# Patient Record
Sex: Female | Born: 1940
Health system: Southern US, Community
[De-identification: ages and names within clinical notes are randomized; demographics above are authoritative.]

## PROBLEM LIST (undated history)

## (undated) DIAGNOSIS — L409 Psoriasis, unspecified: Secondary | ICD-10-CM

## (undated) DIAGNOSIS — G4700 Insomnia, unspecified: Secondary | ICD-10-CM

## (undated) DIAGNOSIS — K635 Polyp of colon: Secondary | ICD-10-CM

## (undated) DIAGNOSIS — E7800 Pure hypercholesterolemia, unspecified: Secondary | ICD-10-CM

## (undated) DIAGNOSIS — T7840XA Allergy, unspecified, initial encounter: Secondary | ICD-10-CM

## (undated) DIAGNOSIS — D72819 Decreased white blood cell count, unspecified: Secondary | ICD-10-CM

## (undated) DIAGNOSIS — M8580 Other specified disorders of bone density and structure, unspecified site: Secondary | ICD-10-CM

## (undated) DIAGNOSIS — M1990 Unspecified osteoarthritis, unspecified site: Secondary | ICD-10-CM

## (undated) DIAGNOSIS — E042 Nontoxic multinodular goiter: Secondary | ICD-10-CM

## (undated) DIAGNOSIS — I10 Essential (primary) hypertension: Secondary | ICD-10-CM

## (undated) DIAGNOSIS — B029 Zoster without complications: Secondary | ICD-10-CM

## (undated) HISTORY — DX: Pure hypercholesterolemia, unspecified: E78.00

## (undated) HISTORY — DX: Decreased white blood cell count, unspecified: D72.819

## (undated) HISTORY — DX: Zoster without complications: B02.9

## (undated) HISTORY — DX: Essential (primary) hypertension: I10

## (undated) HISTORY — DX: Insomnia, unspecified: G47.00

## (undated) HISTORY — DX: Polyp of colon: K63.5

## (undated) HISTORY — DX: Other specified disorders of bone density and structure, unspecified site: M85.80

## (undated) HISTORY — PX: TONSILLECTOMY AND ADENOIDECTOMY: SHX28

## (undated) HISTORY — DX: Nontoxic multinodular goiter: E04.2

## (undated) HISTORY — DX: Unspecified osteoarthritis, unspecified site: M19.90

## (undated) HISTORY — DX: Allergy, unspecified, initial encounter: T78.40XA

## (undated) HISTORY — DX: Psoriasis, unspecified: L40.9

## (undated) HISTORY — PX: CATARACT EXTRACTION, BILATERAL: SHX1313

---

## 1967-08-10 HISTORY — PX: KNEE SURGERY: SHX244

## 1977-08-09 HISTORY — PX: HAND TENDON SURGERY: SHX663

## 2000-02-23 ENCOUNTER — Other Ambulatory Visit: Admission: RE | Admit: 2000-02-23 | Discharge: 2000-02-23 | Payer: Self-pay | Admitting: *Deleted

## 2000-05-09 HISTORY — PX: FLEXIBLE SIGMOIDOSCOPY: SHX1649

## 2000-05-24 ENCOUNTER — Ambulatory Visit (HOSPITAL_COMMUNITY): Admission: RE | Admit: 2000-05-24 | Discharge: 2000-05-24 | Payer: Self-pay | Admitting: Gastroenterology

## 2002-01-02 ENCOUNTER — Other Ambulatory Visit: Admission: RE | Admit: 2002-01-02 | Discharge: 2002-01-02 | Payer: Self-pay | Admitting: *Deleted

## 2002-02-06 HISTORY — PX: OTHER SURGICAL HISTORY: SHX169

## 2006-12-08 HISTORY — PX: COLONOSCOPY: SHX174

## 2009-04-21 ENCOUNTER — Encounter: Admission: RE | Admit: 2009-04-21 | Discharge: 2009-04-21 | Payer: Self-pay | Admitting: Family Medicine

## 2009-12-22 ENCOUNTER — Encounter: Admission: RE | Admit: 2009-12-22 | Discharge: 2009-12-22 | Payer: Self-pay | Admitting: Family Medicine

## 2010-01-20 ENCOUNTER — Encounter: Admission: RE | Admit: 2010-01-20 | Discharge: 2010-01-20 | Payer: Self-pay | Admitting: Endocrinology

## 2010-01-20 ENCOUNTER — Other Ambulatory Visit: Admission: RE | Admit: 2010-01-20 | Discharge: 2010-01-20 | Payer: Self-pay | Admitting: Diagnostic Radiology

## 2010-12-25 NOTE — Procedures (Signed)
[place]. [facility]  Patient:    Lisa [name], Lisa [name]                      MRN: [id number] Proc. Date: 05/24/00 Adm. Date:  [id number] Attending:  Charna Elizabeth CC:         Heather Roberts, M.D.   Procedure Report  DATE OF BIRTH:  1940-09-29.  REFERRING PHYSICIAN:  Heather Roberts, M.D.  PROCEDURE PERFORMED:  Flexible sigmoidoscopy.  ENDOSCOPIST:  Anselmo Rod, M.D.  INSTRUMENT USED:  Olympus video colonoscope.  INDICATIONS FOR PROCEDURE:  Screening flexible sigmoidoscopy being performed in a 70 year old white female.  Rule out colonic polyps, masses, hemorrhoids, etc.  PREPROCEDURE PREPARATION:  Informed consent was procured from the patient. The patient was fasted for eight hours prior to the procedure and prepped with two Fleets enemas the morning of the procedure.  PREPROCEDURE PHYSICAL:  The patient had stable vital signs.  Neck supple. Chest clear to auscultation.  S1, S2 regular.  No murmur, rub or gallop.  No rales, rhonchi or wheezing.  Abdomen soft with normal abdominal bowel sounds.  DESCRIPTION OF PROCEDURE:  The patient was placed in the left lateral decubitus position.  No sedation was used.  Once the patient was adequately positioned, the Olympus video colonoscope was advanced from the rectum to 70 cm without difficulty.  The patient had a few left-sided diverticula that were very small.  The patient had no masses or polyps seen.  The patient had prominent nonbleeding internal hemorrhoids and tolerated the procedure well without complication.  IMPRESSION: 1. Essentially healthy-appearing colon up to 70 cm except for a few left-sided    diverticula. 2. Prominent nonbleeding internal hemorrhoids.  RECOMMENDATIONS: 1. Increase fluid and fiber in the diet. 2. Outpatient follow-up p.r.n. 3. Five year colorectal cancer screen.  RECOMMENDATIONS:DD:  05/24/00 TD:  05/24/00 Job: [id number]

## 2011-01-01 ENCOUNTER — Encounter: Payer: Self-pay | Admitting: Family Medicine

## 2011-01-06 ENCOUNTER — Other Ambulatory Visit: Payer: Self-pay | Admitting: Family Medicine

## 2011-01-06 ENCOUNTER — Ambulatory Visit (INDEPENDENT_AMBULATORY_CARE_PROVIDER_SITE_OTHER): Payer: BC Managed Care – PPO | Admitting: Family Medicine

## 2011-01-06 ENCOUNTER — Encounter: Payer: Self-pay | Admitting: Family Medicine

## 2011-01-06 ENCOUNTER — Other Ambulatory Visit (HOSPITAL_COMMUNITY)
Admission: RE | Admit: 2011-01-06 | Discharge: 2011-01-06 | Disposition: A | Discharge status: Home / Self Care | Payer: Medicare Other | Source: Ambulatory Visit | Attending: Family Medicine | Admitting: Family Medicine

## 2011-01-06 VITALS — BP 124/84 | HR 72 | Ht 62.0 in | Wt 145.0 lb

## 2011-01-06 DIAGNOSIS — E7800 Pure hypercholesterolemia, unspecified: Secondary | ICD-10-CM

## 2011-01-06 DIAGNOSIS — Z124 Encounter for screening for malignant neoplasm of cervix: Secondary | ICD-10-CM | POA: Insufficient documentation

## 2011-01-06 DIAGNOSIS — M8580 Other specified disorders of bone density and structure, unspecified site: Secondary | ICD-10-CM | POA: Insufficient documentation

## 2011-01-06 DIAGNOSIS — E042 Nontoxic multinodular goiter: Secondary | ICD-10-CM

## 2011-01-06 DIAGNOSIS — Z0000 Encounter for general adult medical examination without abnormal findings: Secondary | ICD-10-CM

## 2011-01-06 DIAGNOSIS — M949 Disorder of cartilage, unspecified: Secondary | ICD-10-CM

## 2011-01-06 DIAGNOSIS — I10 Essential (primary) hypertension: Secondary | ICD-10-CM | POA: Insufficient documentation

## 2011-01-06 DIAGNOSIS — G4700 Insomnia, unspecified: Secondary | ICD-10-CM

## 2011-01-06 DIAGNOSIS — M899 Disorder of bone, unspecified: Secondary | ICD-10-CM

## 2011-01-06 DIAGNOSIS — E039 Hypothyroidism, unspecified: Secondary | ICD-10-CM | POA: Insufficient documentation

## 2011-01-06 LAB — LIPID PANEL
Cholesterol: 183 mg/dL (ref 0–200)
HDL: 64 mg/dL (ref >39)
Total CHOL/HDL Ratio: 2.9 Ratio

## 2011-01-06 LAB — POCT URINALYSIS DIPSTICK
Blood, UA: NEGATIVE
Ketones, UA: NEGATIVE
Leukocytes, UA: NEGATIVE
pH, UA: 8

## 2011-01-06 LAB — TSH: TSH: 0.328 u[IU]/mL — ABNORMAL LOW (ref 0.350–4.500)

## 2011-01-06 LAB — COMPREHENSIVE METABOLIC PANEL
ALT: 20 U/L (ref 0–35)
AST: 20 U/L (ref 0–37)
BUN: 11 mg/dL (ref 6–23)
CO2: 25 mEq/L (ref 19–32)
Chloride: 104 mEq/L (ref 96–112)
Glucose, Bld: 87 mg/dL (ref 70–99)
Potassium: 4.3 mEq/L (ref 3.5–5.3)
Total Protein: 7 g/dL (ref 6.0–8.3)

## 2011-01-06 MED ORDER — IRBESARTAN 75 MG PO TABS: 75.0000 mg | ORAL_TABLET | Freq: Every day | ORAL | Status: DC

## 2011-01-06 MED ORDER — LEVOTHYROXINE SODIUM 50 MCG PO TABS: 50.0000 ug | ORAL_TABLET | Freq: Every day | ORAL | Status: DC

## 2011-01-06 MED ORDER — ATORVASTATIN CALCIUM 20 MG PO TABS: 20.0000 mg | ORAL_TABLET | Freq: Every day | ORAL | Status: DC

## 2011-01-06 MED ORDER — QUETIAPINE FUMARATE 25 MG PO TABS: 25.0000 mg | ORAL_TABLET | Freq: Every day | ORAL | Status: DC

## 2011-01-06 NOTE — Progress Notes (Signed)
Subjective:    Patient ID: Lisa [name], female    DOB: [birth year], 70 y.o.   MRN: [id number]  HPI LUCIANNE SMESTAD is a 70 y.o. female who presents for a complete physical.  She is a new patient to the practice, and is also is here for medication check, needing refills of all of her medications.  Hypertension follow-up:  Blood pressures aren't checked elsewhere.  Denies dizziness, headaches, chest pain.  Denies side effects of medications.  Hyperlipidemia follow-up:  Patient is reportedly following a low-fat, low cholesterol diet.  Compliant with medications and denies medication side effects  Hypothyroidism:  Last blood test was 6 months ago with Dr. Talmage Nap.  Has no f/u arranged with her.  Thyroid biopsy in 2011 was benign  Insomnia:  Well controlled with Seroquel; has been on it for 2 years  Osteopenia: Takes Calcium with D BID, gets regular exercise.  Gets some sun daily (eats lunch outside). Last DEXA 2 years ago  Immunization History  Administered Date(s) Administered  . DTaP 01/02/2002  . Influenza Whole 05/02/2010  . Pneumococcal Polysaccharide 09/21/2005   Last Pap smear: years ago Last mammogram: a few years ago Last colonoscopy: 5/08, due again in 2018 Last DEXA: 10/09 Yolanda Bonine) Dentist--sees every 6 months ophtho--every year Exercise--[employer] 3x/week, Zumba 2x/week, and walks on the weekends  Past Medical History  Diagnosis Date  . Allergy   . Leukopenia   . Osteopenia   . OA (osteoarthritis)     hips, knees and LS  . Psoriasis   . Hypertension   . Hypercholesteremia   . Insomnia   . Colon polyps   . Vitamin D deficiency   . Multinodular thyroid     Past Surgical History  Procedure Date  . Flexible sigmoidoscopy 05-2000  . Cardiolyte 02-2002  . Colonoscopy 12-2006  . Tonsillectomy and adenoidectomy   . Knee surgery 1969    Left, cartilage  . Hand tendon surgery 1979    L 4th finger    History   Social History  . Marital Status: Married   Spouse Name: N/A    Number of Children: 2  . 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 Smoking status: Never Smoker   . Smokeless tobacco: Not on file  . Alcohol Use: No     socially, 2 drinks twice weekly  . Drug Use: No  . Sexually Active: Not on file   Other Topics Concern  . Not on file   Social History Narrative  . No narrative on file    Family History  Problem Relation Age of Onset  . Allergies Mother   . Hypertension Mother   . Vision loss Mother   . Heart disease Mother   . Arthritis Mother   . Arthritis Father   . Heart disease Father 61  . Hearing loss Sister   . Hypertension Sister   . Hyperlipidemia Sister   . Thyroid disease Sister   . Arthritis Sister   . Stroke Maternal Grandmother   . Heart disease Maternal Grandfather   . Stroke Maternal Grandfather   . Heart disease Paternal Grandmother   . Stroke Paternal Grandmother   . Cancer Neg Hx   . Arthritis Sister   . Heart disease Sister   . Hyperlipidemia Sister   . Hypertension Sister   . Vision loss Sister     macular degeneration  . Arthritis  Sister   . Vision loss Sister     macular degeneration  . Hypertension Sister   . Hyperlipidemia Sister   . Thyroid disease Sister     Current outpatient prescriptions:aspirin 81 MG tablet, Take 81 mg by mouth daily.  , Disp: , Rfl: ;  atorvastatin (LIPITOR) 20 MG tablet, Take 1 tablet (20 mg total) by mouth daily., Disp: 90 tablet, Rfl: 3;  Calcium Carbonate-Vitamin D (CALTRATE 600+D PO), Take 1 tablet by mouth 2 (two) times daily.  , Disp: , Rfl:  fluocinonide (LIDEX) 0.05 % gel, Apply 1 application topically 4 (four) times daily. Apply to affected area four times daily until resolved , Disp: , Rfl: ;  irbesartan (AVAPRO) 75 MG tablet, Take 1 tablet (75 mg total) by mouth at bedtime., Disp: 90 tablet, Rfl: 3;  levothyroxine (SYNTHROID, LEVOTHROID) 50 MCG tablet, Take 1 tablet  (50 mcg total) by mouth daily., Disp: 90 tablet, Rfl: 3 Multiple Vitamins-Minerals (CENTRUM SILVER PO), Take 1 tablet by mouth daily.  , Disp: , Rfl: ;  QUEtiapine (SEROQUEL) 25 MG tablet, Take 1 tablet (25 mg total) by mouth at bedtime., Disp: 90 tablet, Rfl: 3;  DISCONTD: atorvastatin (LIPITOR) 20 MG tablet, Take 20 mg by mouth daily.  , Disp: , Rfl: ;  DISCONTD: irbesartan (AVAPRO) 75 MG tablet, Take 75 mg by mouth at bedtime.  , Disp: , Rfl:  DISCONTD: levothyroxine (SYNTHROID, LEVOTHROID) 50 MCG tablet, Take 50 mcg by mouth daily.  , Disp: , Rfl: ;  DISCONTD: QUEtiapine (SEROQUEL) 25 MG tablet, Take 25 mg by mouth at bedtime.  , Disp: , Rfl:   Allergies  Allergen Reactions  . Amoxil Rash  . Erythromycin Rash  . Sulfa Antibiotics Rash   Review of Systems The patient denies anorexia, fever, weight changes, fatigue, headaches,  vision changes,ear pain, sore throat, breast concerns, chest pain, palpitations, dizziness, syncope, dyspnea on exertion, cough, swelling, nausea, vomiting, diarrhea, constipation, abdominal pain, melena, hematochezia, indigestion/heartburn, hematuria, incontinence, dysuria, postmenopausal bleeding, vaginal discharge, odor or itch, genital lesions,  numbness, tingling, weakness, tremor, suspicious skin lesions, depression, anxiety, abnormal bleeding/bruising, or enlarged lymph nodes.  Canker sores--treated with Lidex from the dentist.  Currently has some soreness to her lateral tongue and roof of mouth.  Occasional L hip and knee pain.  Occasional ear plugging (R) and tinnitus    Objective:   Physical Exam BP 124/84  Pulse 72  Ht 5\' 2"  (1.575 m)  Wt 145 lb (65.772 kg)  BMI 26.52 kg/m2  General Appearance:    Alert, cooperative, no distress, appears stated age  Head:    Normocephalic, without obvious abnormality, atraumatic  Eyes:    PERRL, conjunctiva/corneas clear, EOM's intact, fundi    benign  Ears:    Normal TM's and external ear canals, some cerumen partly  obscuring R TM  Nose:   Nares normal, mucosa normal, no drainage or sinus   tenderness  Throat:   Lips, mucosa, and tongue normal; teeth and gums normal  Neck:   Supple, no lymphadenopathy;  thyroid: borderline size, no discrete nodules; no carotid bruit or JVD  Back:    Spine nontender, no curvature, ROM normal, no CVA     tenderness  Lungs:     Clear to auscultation bilaterally without wheezes, rales or     ronchi; respirations unlabored  Chest Wall:    No tenderness or deformity   Heart:    Regular rate and rhythm, S1 and S2 normal, no murmur, rub  or gallop  Breast Exam:    No tenderness, masses, or nipple discharge or inversion.      No axillary lymphadenopathy  Abdomen:     Soft, non-tender, nondistended, normoactive bowel sounds,    no masses, no hepatosplenomegaly  Genitalia:    Normal external genitalia without lesions.  Mild atrophic changes. BUS and vagina normal; cervix --polyp noted at os (approx 3 mm), no cervical motion tenderness. No abnormal vaginal discharge.  Uterus and adnexa not enlarged, nontender, no masses.  Pap performed  Rectal:    Normal tone, no masses or tenderness; guaiac negative stool  Extremities:   No clubbing, cyanosis or edema  Pulses:   2+ and symmetric all extremities  Skin:   Skin color, texture, turgor normal, no rashes or lesions  Lymph nodes:   Cervical, supraclavicular, and axillary nodes normal  Neurologic:   CNII-XII intact, normal strength, sensation and gait; reflexes 2+ and symmetric throughout          Psych:   Normal mood, affect, hygiene and grooming.        Assessment & Plan:   1. Routine general medical examination at a health care facility  POCT urinalysis dipstick, Visual acuity screening, Cytology - PAP  2. Pure hypercholesterolemia  Lipid panel, atorvastatin (LIPITOR) 20 MG tablet  3. Essential hypertension, benign  Comprehensive metabolic panel, irbesartan (AVAPRO) 75 MG tablet  4. Multinodular thyroid    5. Osteopenia  DG Bone  Density  6. Insomnia  QUEtiapine (SEROQUEL) 25 MG tablet  7. Unspecified hypothyroidism  TSH, levothyroxine (SYNTHROID, LEVOTHROID) 50 MCG tablet   Cervical polyp.  Pap obtained.  Declined referral to GYN for removal.  Most likely is benign--re-check at CPE next year.   Discussed monthly self breast exams and yearly mammograms after the age of 83; at least 30 minutes of aerobic activity at least 5 days/week; proper sunscreen use reviewed; healthy diet, including goals of calcium and vitamin D intake and alcohol recommendations (less than or equal to 1 drink/day) reviewed; regular seatbelt use; changing batteries in smoke detectors.  Immunization recommendations discussed--discussed Zostavax, and Rx given (risks/benefits reviewed).  TDaP next year. Colonoscopy recommendations reviewed, due again in 2018.  Hemoccult kit given

## 2011-01-07 NOTE — Progress Notes (Signed)
Left message for pt to return my call to go over lab results. vs

## 2011-01-08 ENCOUNTER — Encounter: Payer: Self-pay | Admitting: Family Medicine

## 2011-01-08 NOTE — Progress Notes (Signed)
Pt called and said ok to leave results on her voice mail..  When I found results I called pt home [phone] and left info on labs from Dr. Lynelle Doctor

## 2011-01-18 ENCOUNTER — Telehealth: Payer: Self-pay | Admitting: *Deleted

## 2011-01-18 NOTE — Telephone Encounter (Signed)
Patient called Friday morning to ask questions re:2 of her meds, synthroid and seroquel. Patient stated that she has been taking generic synthroid and the brand name was called in and that she had been taking brand name seroquel and the generic was filled. She was wanting to know if this is okay or should she go back to the way she has been taking them for years since she had no problems in the past. Thx.

## 2011-01-18 NOTE — Telephone Encounter (Signed)
Pt informed that using the generic of Seroquel and brand of Synthroid is ok and that Dr.Knapp actually prefers this. Pt stated that she is fine with that and for her there was no price difference. She will call if needing a change in the future after taking the meds and finding that she is not happy.

## 2011-01-18 NOTE — Telephone Encounter (Signed)
Advise pt--generic Seroquel should be fine. If she has a problem with it, let us know and we can change it back to brand (only if she thinks she is noticing a difference and prefers the other).  In general, I prefer to use branded thyroid meds if patients are able to afford the difference.  I'm sure we asked her regarding brand vs generic at her visit.  If she prefers [place] to switch it back to generic (mainly for cost purposes), then we can.

## 2011-02-15 ENCOUNTER — Telehealth: Payer: Self-pay | Admitting: *Deleted

## 2011-02-15 NOTE — Telephone Encounter (Signed)
Left message for patient to return my call for bone density results.

## 2011-02-17 ENCOUNTER — Telehealth: Payer: Self-pay | Admitting: *Deleted

## 2011-02-17 NOTE — Telephone Encounter (Signed)
Spoke with patient, went over bone density results with her. Informed her that her test was unchanged from her test in 2009, mild osteopenia. Continue with calcium and vitamin d and exercise. Recheck 2-3 years.

## 2011-03-08 ENCOUNTER — Encounter: Payer: Self-pay | Admitting: *Deleted

## 2011-08-20 ENCOUNTER — Other Ambulatory Visit: Payer: BC Managed Care – PPO

## 2011-08-20 DIAGNOSIS — R6889 Other general symptoms and signs: Secondary | ICD-10-CM | POA: Diagnosis not present

## 2011-08-20 DIAGNOSIS — R7989 Other specified abnormal findings of blood chemistry: Secondary | ICD-10-CM

## 2011-08-20 DIAGNOSIS — I10 Essential (primary) hypertension: Secondary | ICD-10-CM | POA: Diagnosis not present

## 2011-08-20 DIAGNOSIS — E039 Hypothyroidism, unspecified: Secondary | ICD-10-CM | POA: Diagnosis not present

## 2011-08-23 ENCOUNTER — Encounter: Payer: Self-pay | Admitting: Family Medicine

## 2011-09-10 DIAGNOSIS — H2510 Age-related nuclear cataract, unspecified eye: Secondary | ICD-10-CM | POA: Diagnosis not present

## 2012-01-13 ENCOUNTER — Ambulatory Visit (INDEPENDENT_AMBULATORY_CARE_PROVIDER_SITE_OTHER): Payer: BC Managed Care – PPO | Admitting: Family Medicine

## 2012-01-13 ENCOUNTER — Encounter: Payer: Self-pay | Admitting: Family Medicine

## 2012-01-13 VITALS — BP 122/78 | HR 76 | Ht 61.5 in | Wt 143.0 lb

## 2012-01-13 DIAGNOSIS — M8580 Other specified disorders of bone density and structure, unspecified site: Secondary | ICD-10-CM

## 2012-01-13 DIAGNOSIS — E039 Hypothyroidism, unspecified: Secondary | ICD-10-CM | POA: Diagnosis not present

## 2012-01-13 DIAGNOSIS — Z23 Encounter for immunization: Secondary | ICD-10-CM

## 2012-01-13 DIAGNOSIS — Z01419 Encounter for gynecological examination (general) (routine) without abnormal findings: Secondary | ICD-10-CM | POA: Diagnosis not present

## 2012-01-13 DIAGNOSIS — E7800 Pure hypercholesterolemia, unspecified: Secondary | ICD-10-CM | POA: Diagnosis not present

## 2012-01-13 DIAGNOSIS — M899 Disorder of bone, unspecified: Secondary | ICD-10-CM

## 2012-01-13 DIAGNOSIS — M949 Disorder of cartilage, unspecified: Secondary | ICD-10-CM

## 2012-01-13 DIAGNOSIS — G4700 Insomnia, unspecified: Secondary | ICD-10-CM

## 2012-01-13 DIAGNOSIS — I10 Essential (primary) hypertension: Secondary | ICD-10-CM

## 2012-01-13 LAB — POCT URINALYSIS DIPSTICK
Bilirubin, UA: NEGATIVE
Glucose, UA: NEGATIVE
Ketones, UA: NEGATIVE
Leukocytes, UA: NEGATIVE
Urobilinogen, UA: NEGATIVE
pH, UA: 7

## 2012-01-13 MED ORDER — QUETIAPINE FUMARATE 25 MG PO TABS: 25.0000 mg | ORAL_TABLET | Freq: Every day | ORAL | Status: DC

## 2012-01-13 MED ORDER — SYNTHROID 50 MCG PO TABS: 50.0000 ug | ORAL_TABLET | Freq: Every day | ORAL | Status: DC

## 2012-01-13 MED ORDER — ATORVASTATIN CALCIUM 20 MG PO TABS: 20.0000 mg | ORAL_TABLET | Freq: Every day | ORAL | Status: DC

## 2012-01-13 MED ORDER — IRBESARTAN 75 MG PO TABS: 75.0000 mg | ORAL_TABLET | Freq: Every day | ORAL | Status: DC

## 2012-01-13 NOTE — Patient Instructions (Signed)

## 2012-01-13 NOTE — Progress Notes (Signed)
Patient presents for med check, as well as breast and pelvic exam (as her insurance doesn't cover physicals).  Hypertension follow-up: Blood pressures aren't checked elsewhere. Denies dizziness, headaches, chest pain. Denies side effects of medications.   Hyperlipidemia follow-up: Patient is reportedly following a low-fat, low cholesterol diet. Compliant with medications and denies medication side effects   Hypothyroidism: Thyroid biopsy in 2011 was benign; had seen Dr. Talmage Nap in the past, but we are now managing her medications.  Denies fatigue, hair, skin, bowel changes.  Insomnia: Well controlled with Seroquel; has been on it for 3 years   Osteopenia: Takes Calcium with D BID, gets regular exercise. Gets some sun daily (eats lunch outside). Last DEXA 01/2011, T-1.4.  Unchanged from 2009, recommended to repeat in 2-3 years.  Health Maintenance: Immunization History  Administered Date(s) Administered  . DTaP 01/02/2002  . Influenza Whole 05/02/2010  . Pneumococcal Polysaccharide 09/21/2005  . Zoster 02/26/2011  gets flu shots yearly through work Last mammo 02/2012 Colonoscopy 5/08 Dexa 01/2011 Dentist and ophtho regularly Exercise: stopped going to [employer] due to knee pain.  Walks 3 days/week, 30 minutes. Pap: 12/2010  Past Medical History  Diagnosis Date  . Allergy   . Leukopenia   . Osteopenia   . OA (osteoarthritis)     hips, knees and LS  . Psoriasis   . Hypertension   . Hypercholesteremia   . Insomnia   . Colon polyps     hyperplastic  . Vitamin d deficiency   . Multinodular thyroid     Past Surgical History  Procedure Date  . Flexible sigmoidoscopy 05-2000  . Cardiolyte 02-2002  . Colonoscopy 12-2006  . Tonsillectomy and adenoidectomy   . Knee surgery 1969    Left, cartilage  . Hand tendon surgery 1979    L 4th finger    History   Social History  . Marital Status: Married    Spouse Name: N/A    Number of Children: 2  . 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 Smoking status: Never Smoker   . Smokeless tobacco: Never Used  . Alcohol Use: Yes     socially, 2 drinks twice weekly  . Drug Use: No  . Sexually Active: Yes -- Female partner(s)   Other Topics Concern  . Not on file   Social History Narrative  . No narrative on file    Family History  Problem Relation Age of Onset  . Allergies Mother   . Hypertension Mother   . Vision loss Mother   . Heart disease Mother   . Arthritis Mother   . Arthritis Father   . Heart disease Father 6  . Hearing loss Sister   . Hypertension Sister   . Hyperlipidemia Sister   . Arthritis Sister   . Stroke Maternal Grandmother   . Heart disease Maternal Grandfather   . Stroke Maternal Grandfather   . Heart disease Paternal Grandmother   . Stroke Paternal Grandmother   . Cancer Neg Hx   . Arthritis Sister   . Heart disease Sister   . Hyperlipidemia Sister   . Hypertension Sister   . Vision loss Sister     macular degeneration  . Arthritis Sister   . Hypertension Sister   . Hyperlipidemia Sister   . Thyroid disease Sister    Current Outpatient Prescriptions on File Prior to Visit  Medication Sig Dispense Refill  .  aspirin 81 MG tablet Take 81 mg by mouth daily.        Marland Kitchen atorvastatin (LIPITOR) 20 MG tablet Take 1 tablet (20 mg total) by mouth daily.  90 tablet  3  . Calcium Carbonate-Vitamin D (CALTRATE 600+D PO) Take 1 tablet by mouth 2 (two) times daily.        . fluocinonide (LIDEX) 0.05 % gel Apply 1 application topically 4 (four) times daily. Apply to affected area four times daily until resolved       . irbesartan (AVAPRO) 75 MG tablet Take 1 tablet (75 mg total) by mouth at bedtime.  90 tablet  3  . levothyroxine (SYNTHROID, LEVOTHROID) 50 MCG tablet Take 1 tablet (50 mcg total) by mouth daily.  90 tablet  3  . Multiple Vitamins-Minerals (CENTRUM SILVER PO) Take 1 tablet by mouth daily.         . QUEtiapine (SEROQUEL) 25 MG tablet Take 1 tablet (25 mg total) by mouth at bedtime.  90 tablet  3    Allergies  Allergen Reactions  . Amoxicillin Rash  . Erythromycin Rash  . Sulfa Antibiotics Rash   ROS:  The patient denies anorexia, fever, weight changes, headaches,  vision changes, decreased hearing, ear pain, sore throat, breast concerns, chest pain, palpitations, dizziness, syncope, dyspnea on exertion, cough, swelling, nausea, vomiting, diarrhea, constipation, abdominal pain, melena, hematochezia, indigestion/heartburn, hematuria, incontinence, dysuria, vaginal bleeding, discharge, odor or itch, genital lesions, joint pains, numbness, tingling, weakness, tremor, suspicious skin lesions, depression, anxiety, abnormal bleeding/bruising, or enlarged lymph nodes.  PHYSICAL EXAM: BP 122/78  Pulse 76  Ht 5' 1.5" (1.562 m)  Wt 143 lb (64.864 kg)  BMI 26.58 kg/m2 BP 122/78  Pulse 76  Ht 5' 1.5" (1.562 m)  Wt 143 lb (64.864 kg)  BMI 26.58 kg/m2  General Appearance:    Alert, cooperative, no distress, appears stated age  Head:    Normocephalic, without obvious abnormality, atraumatic  Eyes:    PERRL, conjunctiva/corneas clear, EOM's intact, fundi    benign  Ears:    Normal TM's and external ear canals  Nose:   Nares normal, mucosa normal, no drainage or sinus   tenderness  Throat:   Lips, mucosa, and tongue normal; teeth and gums normal  Neck:   Supple, no lymphadenopathy;  thyroid:  no   enlargement/tenderness/nodules; no carotid   bruit or JVD  Back:    Spine nontender, no curvature, ROM normal, no CVA     tenderness  Lungs:     Clear to auscultation bilaterally without wheezes, rales or     ronchi; respirations unlabored  Chest Wall:    No tenderness or deformity   Heart:    Regular rate and rhythm, S1 and S2 normal, no murmur, rub   or gallop  Breast Exam:    No tenderness, masses, or nipple discharge or inversion.      No axillary lymphadenopathy  Abdomen:     Soft,  non-tender, nondistended, normoactive bowel sounds,    no masses, no hepatosplenomegaly  Genitalia:    Normal external genitalia without lesions.  BUS and vagina normal; no cervical motion tenderness. No abnormal vaginal discharge.  Uterus and adnexa not enlarged, nontender, no masses.  Pap not performed  Rectal:    Normal tone, no masses or tenderness; guaiac negative stool  Extremities:   No clubbing, cyanosis or edema  Pulses:   2+ and symmetric all extremities  Skin:   Skin color, texture, turgor normal, no rashes or  lesions  Lymph nodes:   Cervical, supraclavicular, and axillary nodes normal  Neurologic:   CNII-XII intact, normal strength, sensation and gait; reflexes 2+ and symmetric throughout          Psych:   Normal mood, affect, hygiene and grooming.     ASSESSMENT/PLAN: 1. Essential hypertension, benign  POCT Urinalysis Dipstick, Comprehensive metabolic panel, irbesartan (AVAPRO) 75 MG tablet  2. Osteopenia  Vitamin D 25 hydroxy  3. Pure hypercholesterolemia  Lipid panel, Comprehensive metabolic panel, atorvastatin (LIPITOR) 20 MG tablet  4. Unspecified hypothyroidism  TSH, SYNTHROID 50 MCG tablet  5. Insomnia  QUEtiapine (SEROQUEL) 25 MG tablet  6. Need for Tdap vaccination  Tdap vaccine greater than or equal to 7yo IM   Discussed monthly self breast exams and yearly mammograms; at least 30 minutes of aerobic activity at least 5 days/week; proper sunscreen use reviewed; healthy diet, including goals of calcium and vitamin D intake and alcohol recommendations (less than or equal to 1 drink/day) reviewed; regular seatbelt use; changing batteries in smoke detectors.  Immunization recommendations discussed--TdaP given.  Colonoscopy recommendations reviewed, UTD.  F/u 1 year, sooner prn  45 minute office visit, all face-to-face.

## 2012-01-14 ENCOUNTER — Encounter: Payer: Self-pay | Admitting: Family Medicine

## 2012-01-14 LAB — COMPREHENSIVE METABOLIC PANEL
ALT: 26 U/L (ref 0–35)
AST: 27 U/L (ref 0–37)
Albumin: 4.8 g/dL (ref 3.5–5.2)
BUN: 10 mg/dL (ref 6–23)
Chloride: 102 mEq/L (ref 96–112)
Creat: 0.64 mg/dL (ref 0.50–1.10)
Glucose, Bld: 81 mg/dL (ref 70–99)
Sodium: 138 mEq/L (ref 135–145)

## 2012-01-14 LAB — LIPID PANEL
Total CHOL/HDL Ratio: 2.5 Ratio
VLDL: 12 mg/dL (ref 0–40)

## 2012-01-14 LAB — VITAMIN D 25 HYDROXY (VIT D DEFICIENCY, FRACTURES): Vit D, 25-Hydroxy: 54 ng/mL (ref 30–89)

## 2012-04-19 DIAGNOSIS — Z23 Encounter for immunization: Secondary | ICD-10-CM | POA: Diagnosis not present

## 2012-10-05 DIAGNOSIS — H2510 Age-related nuclear cataract, unspecified eye: Secondary | ICD-10-CM | POA: Diagnosis not present

## 2012-11-20 ENCOUNTER — Other Ambulatory Visit: Payer: Self-pay | Admitting: Family Medicine

## 2012-11-20 DIAGNOSIS — E7800 Pure hypercholesterolemia, unspecified: Secondary | ICD-10-CM

## 2012-11-20 DIAGNOSIS — I10 Essential (primary) hypertension: Secondary | ICD-10-CM

## 2012-11-20 DIAGNOSIS — G4700 Insomnia, unspecified: Secondary | ICD-10-CM

## 2012-11-20 DIAGNOSIS — E039 Hypothyroidism, unspecified: Secondary | ICD-10-CM

## 2012-11-20 MED ORDER — ATORVASTATIN CALCIUM 20 MG PO TABS: 20.0000 mg | ORAL_TABLET | Freq: Every day | ORAL | Status: DC

## 2012-11-20 MED ORDER — SYNTHROID 50 MCG PO TABS: 50.0000 ug | ORAL_TABLET | Freq: Every day | ORAL | Status: DC

## 2012-11-20 MED ORDER — IRBESARTAN 75 MG PO TABS: 75.0000 mg | ORAL_TABLET | Freq: Every day | ORAL | Status: DC

## 2012-11-20 MED ORDER — QUETIAPINE FUMARATE 25 MG PO TABS: 25.0000 mg | ORAL_TABLET | Freq: Every day | ORAL | Status: DC

## 2012-11-20 NOTE — Telephone Encounter (Signed)
She was given a year supply of her meds at her visit in June--shouldn't need refill til June, but likely just got her last refill. Okay to refill meds once (90 days) to last until her appt.  She will be about 3 months late on her appt and labs. If she is having symptoms or problems, she should return sooner for OV

## 2012-11-20 NOTE — Telephone Encounter (Signed)
Needs refills on synthroid, lipitor, avapro and seroquel   (only currently low on synthroid) but will run out before September 3rd Med check Plus  Sent to [facility]  90 day supplies   Also, her Med check Plus is scheduled for the afternoon, can she come in before appointment to have labs done? She would like you to have results at her appointment  Please call patient

## 2013-01-31 ENCOUNTER — Other Ambulatory Visit: Payer: Self-pay | Admitting: Family Medicine

## 2013-01-31 NOTE — Telephone Encounter (Signed)
Is this okay to refill? 

## 2013-02-01 ENCOUNTER — Telehealth: Payer: Self-pay | Admitting: Family Medicine

## 2013-02-01 ENCOUNTER — Other Ambulatory Visit: Payer: Self-pay

## 2013-02-01 DIAGNOSIS — M899 Disorder of bone, unspecified: Secondary | ICD-10-CM

## 2013-02-01 NOTE — Telephone Encounter (Signed)
PUT ORDER IN EPIC AND FAXED ORDER OVER

## 2013-02-01 NOTE — Telephone Encounter (Signed)
She has a history of osteopenia but I do not see when her last DEXA was. If it has been over 2 years, go ahead and approve this

## 2013-02-05 DIAGNOSIS — M899 Disorder of bone, unspecified: Secondary | ICD-10-CM | POA: Diagnosis not present

## 2013-02-05 DIAGNOSIS — M949 Disorder of cartilage, unspecified: Secondary | ICD-10-CM | POA: Diagnosis not present

## 2013-02-06 ENCOUNTER — Encounter: Payer: Self-pay | Admitting: Internal Medicine

## 2013-03-23 ENCOUNTER — Telehealth: Payer: Self-pay | Admitting: Family Medicine

## 2013-03-23 DIAGNOSIS — G4700 Insomnia, unspecified: Secondary | ICD-10-CM

## 2013-03-23 DIAGNOSIS — I10 Essential (primary) hypertension: Secondary | ICD-10-CM

## 2013-03-23 DIAGNOSIS — E7800 Pure hypercholesterolemia, unspecified: Secondary | ICD-10-CM

## 2013-03-23 DIAGNOSIS — E039 Hypothyroidism, unspecified: Secondary | ICD-10-CM

## 2013-03-23 MED ORDER — ATORVASTATIN CALCIUM 20 MG PO TABS: 20.0000 mg | ORAL_TABLET | Freq: Every day | ORAL | Status: DC

## 2013-03-23 MED ORDER — SYNTHROID 50 MCG PO TABS: 50.0000 ug | ORAL_TABLET | Freq: Every day | ORAL | Status: DC

## 2013-03-23 MED ORDER — IRBESARTAN 75 MG PO TABS: 75.0000 mg | ORAL_TABLET | Freq: Every day | ORAL | Status: DC

## 2013-03-23 MED ORDER — QUETIAPINE FUMARATE 25 MG PO TABS: 25.0000 mg | ORAL_TABLET | Freq: Every day | ORAL | Status: DC

## 2013-03-23 NOTE — Telephone Encounter (Signed)
Refills sent to the patients pharmacy. CLS

## 2013-04-04 ENCOUNTER — Telehealth: Payer: Self-pay | Admitting: Family Medicine

## 2013-04-04 DIAGNOSIS — E7800 Pure hypercholesterolemia, unspecified: Secondary | ICD-10-CM

## 2013-04-04 DIAGNOSIS — E039 Hypothyroidism, unspecified: Secondary | ICD-10-CM

## 2013-04-04 DIAGNOSIS — I10 Essential (primary) hypertension: Secondary | ICD-10-CM

## 2013-04-04 NOTE — Telephone Encounter (Signed)
Future orders have been entered.  Okay to schedule lab visit

## 2013-04-04 NOTE — Telephone Encounter (Signed)
PT HAS A MEDCHECK PL[facility] APPT NEXT Wednesday. SHE IS REQUESTING THAT SHE COME IN SEVERAL DAYS EARLY TO HAVE LABS DRAWN SO THOSE CAN BE IN HAND FOR HER APPT. PLEASE ADVISE AND INFORM PT.

## 2013-04-04 NOTE — Telephone Encounter (Signed)
PT WAS CALLED AND LABS VISIT WAS MADE

## 2013-04-05 ENCOUNTER — Other Ambulatory Visit: Payer: Medicare Other

## 2013-04-05 DIAGNOSIS — E039 Hypothyroidism, unspecified: Secondary | ICD-10-CM | POA: Diagnosis not present

## 2013-04-05 DIAGNOSIS — E7800 Pure hypercholesterolemia, unspecified: Secondary | ICD-10-CM

## 2013-04-05 DIAGNOSIS — I10 Essential (primary) hypertension: Secondary | ICD-10-CM | POA: Diagnosis not present

## 2013-04-06 LAB — COMPREHENSIVE METABOLIC PANEL
ALT: 21 U/L (ref 0–35)
AST: 21 U/L (ref 0–37)
Albumin: 4.9 g/dL (ref 3.5–5.2)
Alkaline Phosphatase: 55 U/L (ref 39–117)
Glucose, Bld: 85 mg/dL (ref 70–99)
Potassium: 4.2 mEq/L (ref 3.5–5.3)
Sodium: 138 mEq/L (ref 135–145)
Total Protein: 7 g/dL (ref 6.0–8.3)

## 2013-04-06 LAB — LIPID PANEL
LDL Cholesterol: 85 mg/dL (ref 0–99)
Triglycerides: 84 mg/dL (ref <150)

## 2013-04-06 LAB — TSH: TSH: 2.133 u[IU]/mL (ref 0.350–4.500)

## 2013-04-11 ENCOUNTER — Ambulatory Visit (INDEPENDENT_AMBULATORY_CARE_PROVIDER_SITE_OTHER): Payer: Medicare Other | Admitting: Family Medicine

## 2013-04-11 ENCOUNTER — Encounter: Payer: Self-pay | Admitting: Family Medicine

## 2013-04-11 VITALS — BP 102/68 | HR 80 | Ht 62.0 in | Wt 148.0 lb

## 2013-04-11 DIAGNOSIS — I10 Essential (primary) hypertension: Secondary | ICD-10-CM

## 2013-04-11 DIAGNOSIS — Z01419 Encounter for gynecological examination (general) (routine) without abnormal findings: Secondary | ICD-10-CM

## 2013-04-11 DIAGNOSIS — E039 Hypothyroidism, unspecified: Secondary | ICD-10-CM

## 2013-04-11 DIAGNOSIS — E7800 Pure hypercholesterolemia, unspecified: Secondary | ICD-10-CM | POA: Diagnosis not present

## 2013-04-11 DIAGNOSIS — M8580 Other specified disorders of bone density and structure, unspecified site: Secondary | ICD-10-CM

## 2013-04-11 DIAGNOSIS — M25551 Pain in right hip: Secondary | ICD-10-CM

## 2013-04-11 DIAGNOSIS — G4700 Insomnia, unspecified: Secondary | ICD-10-CM

## 2013-04-11 DIAGNOSIS — M25559 Pain in unspecified hip: Secondary | ICD-10-CM

## 2013-04-11 DIAGNOSIS — Z0000 Encounter for general adult medical examination without abnormal findings: Secondary | ICD-10-CM | POA: Diagnosis not present

## 2013-04-11 DIAGNOSIS — M899 Disorder of bone, unspecified: Secondary | ICD-10-CM

## 2013-04-11 LAB — HEMOCCULT GUIAC POC 1CARD (OFFICE): Fecal Occult Blood, POC: NEGATIVE

## 2013-04-11 MED ORDER — QUETIAPINE FUMARATE 25 MG PO TABS: 25.0000 mg | ORAL_TABLET | Freq: Every day | ORAL | Status: DC

## 2013-04-11 MED ORDER — IRBESARTAN 75 MG PO TABS: 75.0000 mg | ORAL_TABLET | Freq: Every day | ORAL | Status: DC

## 2013-04-11 MED ORDER — ATORVASTATIN CALCIUM 20 MG PO TABS: 20.0000 mg | ORAL_TABLET | Freq: Every day | ORAL | Status: DC

## 2013-04-11 MED ORDER — SYNTHROID 50 MCG PO TABS: 50.0000 ug | ORAL_TABLET | Freq: Every day | ORAL | Status: DC

## 2013-04-11 NOTE — Progress Notes (Signed)
Chief Complaint  Patient presents with  . Med check plus    nonfasting med check, labs already done. Has been having problems with right hip, off an on over the last year. Also would like to discuss DEXA results.   Patient presents for med check, breast/pelvic exam, and Annual Wellness Visit.  She is complaining of right hip pain intermittently over the last year. Pain is at lateral hip, rarely will radiate down to her knee.  When flaring, it hurts with walking.  Denies any groin pain.  She had some back pain after trimming bushes, but not at the same time as her hip pain.  Denies any numbness, tingling into leg, or any leg weakness.  Hypertension follow-up: Blood pressures aren't checked elsewhere. Denies dizziness, headaches, chest pain. Denies side effects of medications.   Hyperlipidemia follow-up: Patient is reportedly following a low-fat, low cholesterol diet. Compliant with medications and denies medication side effects.  Hypothyroidism: Thyroid biopsy in 2011 was benign; had seen Dr. Talmage Nap in the past, but we are now managing her medications. Denies fatigue, hair, skin, bowel changes. Denies any mood changes.  Denies dysphagia or any thyroid concerns.  Insomnia: Well controlled with Seroquel; has been on it for 4 years.  Osteopenia: Takes Calcium with D BID, gets regular exercise. Gets some sun daily (walking outside). Last DEXA 01/2013 and was stable, no statistical change  Immunization History  Administered Date(s) Administered  . Influenza Whole 05/02/2010  . Pneumococcal Polysaccharide 09/21/2005  . Td 01/02/2002  . Tdap 01/13/2012  . Zoster 02/26/2011   gets flu shots yearly, previously through work; doesn't want to get today, prior to going OOT (and high dose isn't yet available) Last mammo 02/2012  (didn't get last year, didn't think she needed to) Colonoscopy 5/08  Dexa 01/2013 Dentist and ophtho regularly  Exercise:  Walks 3-4 days/week, 30 minutes (2 miles), plus chair  yoga Pap: 12/2010  AWV:   Other doctors in her care: Ophtho:  Dr. Nile Riggs Dentist: Dr. Lendon Ka No longer seeing Dr. Talmage Nap GI: Dr. Loreta Ave  Depression screen--see scanned form ADL screen--see scanned form  End of Life discussion:  She has a living will and healthcare power of attorney.  Past Medical History  Diagnosis Date  . Allergy   . Leukopenia   . Osteopenia   . OA (osteoarthritis)     hips, knees and LS  . Psoriasis   . Hypertension   . Hypercholesteremia   . Insomnia   . Colon polyps     hyperplastic  . Vitamin D deficiency   . Multinodular thyroid     Past Surgical History  Procedure Laterality Date  . Flexible sigmoidoscopy  05-2000  . Cardiolyte  02-2002  . Colonoscopy  12-2006  . Tonsillectomy and adenoidectomy    . Knee surgery  1969    Left, cartilage  . Hand tendon surgery  1979    L 4th finger    History   Social History  . Marital Status: Married    Spouse Name: N/A    Number of Children: 2  . Years of Education: N/A   Occupational History  . Admin at [employer]   Social History Main Topics  . Smoking status: Never Smoker   . Smokeless tobacco: Never Used  . Alcohol Use: Yes     Comment: socially, 2 drinks twice weekly  . Drug Use: No  . Sexual Activity: Yes    Partners: Male   Other Topics Concern  .  Not on file   Social History Narrative   Lives with her husband.  Son is in [place], daughter and grandson are local.      Family History  Problem Relation Age of Onset  . Allergies Mother   . Hypertension Mother   . Vision loss Mother   . Heart disease Mother   . Arthritis Mother   . Arthritis Father   . Heart disease Father 17  . Hearing loss Sister   . Hypertension Sister   . Hyperlipidemia Sister   . Arthritis Sister   . Stroke Maternal Grandmother   . Heart disease Maternal Grandfather   . Stroke Maternal Grandfather   . Heart disease Paternal Grandmother   . Stroke Paternal  Grandmother   . Cancer Neg Hx   . Arthritis Sister   . Heart disease Sister   . Hyperlipidemia Sister   . Hypertension Sister   . Vision loss Sister     macular degeneration  . Arthritis Sister   . Hypertension Sister   . Hyperlipidemia Sister   . Thyroid disease Sister   . Diabetes Sister 45    diet controlled    Current outpatient prescriptions:aspirin 81 MG tablet, Take 81 mg by mouth daily.  , Disp: , Rfl: ;  atorvastatin (LIPITOR) 20 MG tablet, Take 1 tablet (20 mg total) by mouth daily., Disp: 90 tablet, Rfl: 3;  Calcium Carbonate-Vitamin D (CALTRATE 600+D PO), Take 1 tablet by mouth 2 (two) times daily.  , Disp: , Rfl: ;  irbesartan (AVAPRO) 75 MG tablet, Take 1 tablet (75 mg total) by mouth at bedtime., Disp: 90 tablet, Rfl: 3 Multiple Vitamins-Minerals (CENTRUM SILVER PO), Take 1 tablet by mouth daily.  , Disp: , Rfl: ;  QUEtiapine (SEROQUEL) 25 MG tablet, Take 1 tablet (25 mg total) by mouth at bedtime., Disp: 90 tablet, Rfl: 3;  SYNTHROID 50 MCG tablet, Take 1 tablet (50 mcg total) by mouth daily., Disp: 90 tablet, Rfl: 3 fluocinonide (LIDEX) 0.05 % gel, Apply 1 application topically 4 (four) times daily. Apply to affected area four times daily until resolved , Disp: , Rfl:   Allergies  Allergen Reactions  . Amoxicillin Rash  . Erythromycin Rash  . Sulfa Antibiotics Rash   ROS: The patient denies anorexia, fever, weight changes, headaches, vision changes, decreased hearing, ear pain, sore throat, breast concerns, chest pain, palpitations, dizziness, syncope, dyspnea on exertion, cough, swelling, nausea, vomiting, diarrhea, constipation, abdominal pain, melena, hematochezia, indigestion/heartburn, hematuria, incontinence, dysuria, vaginal bleeding, discharge, odor or itch, genital lesions, numbness, tingling, weakness, tremor, suspicious skin lesions, depression, anxiety, abnormal bleeding/bruising, or enlarged lymph nodes. +hip pain as per HPI.  back pain resolved.  PHYSICAL  EXAM: BP 102/68  Pulse 80  Ht 5\' 2"  (1.575 m)  Wt 148 lb (67.132 kg)  BMI 27.06 kg/m2  General Appearance:  Alert, cooperative, no distress, appears stated age   Head:  Normocephalic, without obvious abnormality, atraumatic   Eyes:  PERRL, conjunctiva/corneas clear, EOM's intact, fundi  benign   Ears:  Normal TM's and external ear canals   Nose:  Nares normal, mucosa normal, no drainage or sinus tenderness   Throat:  Lips, mucosa, and tongue normal; teeth and gums normal   Neck:  Supple, no lymphadenopathy; thyroid: no enlargement/tenderness/nodules; no carotid  bruit or JVD   Back:  Spine nontender, no curvature, ROM normal, no CVA tenderness   Lungs:  Clear to auscultation bilaterally without wheezes, rales or ronchi; respirations unlabored   Chest Wall:  No tenderness or deformity   Heart:  Regular rate and rhythm, S1 and S2 normal, no murmur, rub  or gallop   Breast Exam:  No tenderness, masses, or nipple discharge or inversion. No axillary lymphadenopathy   Abdomen:  Soft, non-tender, nondistended, normoactive bowel sounds,  no masses, no hepatosplenomegaly   Genitalia:  Normal external genitalia without lesions. BUS and vagina normal; no cervical motion tenderness. No abnormal vaginal discharge. Uterus and adnexa not enlarged, nontender, no masses. Pap not performed   Rectal:  Normal tone, no masses or tenderness; guaiac negative stool   Extremities:  No clubbing, cyanosis or edema. Mildly tender at R trochanteric bursa.  Tender also in deep buttock muscles (just posterior to greater trochanter), some discomfort with stretches of deep buttock muscles, pyriformis  Pulses:  2+ and symmetric all extremities   Skin:  Skin color, texture, turgor normal, no rashes or lesions   Lymph nodes:  Cervical, supraclavicular, and axillary nodes normal   Neurologic:  CNII-XII intact, normal strength, sensation and gait; reflexes 2+ and symmetric throughout          Psych: Normal mood, affect,  hygiene and grooming.      Chemistry      Component Value Date/Time   NA 138 04/05/2013 0834   K 4.2 04/05/2013 0834   CL 103 04/05/2013 0834   CO2 27 04/05/2013 0834   BUN 12 04/05/2013 0834   CREATININE 0.65 04/05/2013 0834      Component Value Date/Time   CALCIUM 10.4 04/05/2013 0834   ALKPHOS 55 04/05/2013 0834   AST 21 04/05/2013 0834   ALT 21 04/05/2013 0834   BILITOT 0.5 04/05/2013 0834     Glucose 85  Lab Results  Component Value Date   CHOL 167 04/05/2013   HDL 65 04/05/2013   LDLCALC 85 04/05/2013   TRIG 84 04/05/2013   CHOLHDL 2.6 04/05/2013   Lab Results  Component Value Date   TSH 2.133 04/05/2013   DEXA results reviewed with patient--no significant decline at hip, (4.1% increase at spine); FRAX scores below cutoffs for treatment.  ASSESSMENT/PLAN:  Essential hypertension, benign - controlled - Plan: irbesartan (AVAPRO) 75 MG tablet  Insomnia - controlled - Plan: QUEtiapine (SEROQUEL) 25 MG tablet  Pure hypercholesterolemia - controlled - Plan: atorvastatin (LIPITOR) 20 MG tablet  Unspecified hypothyroidism - adequately replaced - Plan: SYNTHROID 50 MCG tablet  Osteopenia - stable.  discussed adding upper extremity weight-bearing exercise; repeat DEXA in 2-4 years  Right hip pain - muscular, vs mild/early trochanteric bursitis.  shown stretches. NSAIDs prn.  f/u if persistent/worsening   Continue with hip stretches as shown.  If you are having more pain over the bony prominence (as shown) then you may also have a component of bursitis.  You can try Aleve twice daily with food for 10-14 days to see if that eliminates your hip pain.  I do not think it is coming from your joint (not arthritis).  Discussed monthly self breast exams and yearly mammograms (discussed recommendations of 1 vs 2 years, looking at life expectance; I encourage yearly exams); at least 30 minutes of aerobic activity at least 5 days/week; proper sunscreen use reviewed; healthy diet, including goals  of calcium and vitamin D intake and alcohol recommendations (less than or equal to 1 drink/day) reviewed; regular seatbelt use; changing batteries in smoke detectors.  Immunization recommendations discussed--to get high dose flu shot at her convenience within the next 1-2 months.  Colonoscopy recommendations reviewed, UTD. Hemoccult cards  given.  Annual Wellness Visit--see scanned sheet that was reviewed with patient re: health maintenance/preventative recommendations.  She is UTD with everything except mammogram. Full Code, Full Care  F/u in 1 year, sooner prn  55 minute visit

## 2013-04-11 NOTE — Patient Instructions (Addendum)
     Chemistry      Component Value Date/Time   NA 138 04/05/2013 0834   K 4.2 04/05/2013 0834   CL 103 04/05/2013 0834   CO2 27 04/05/2013 0834   BUN 12 04/05/2013 0834   CREATININE 0.65 04/05/2013 0834      Component Value Date/Time   CALCIUM 10.4 04/05/2013 0834   ALKPHOS 55 04/05/2013 0834   AST 21 04/05/2013 0834   ALT 21 04/05/2013 0834   BILITOT 0.5 04/05/2013 0834     Glucose 85  Lab Results  Component Value Date   CHOL 167 04/05/2013   HDL 65 04/05/2013   LDLCALC 85 04/05/2013   TRIG 84 04/05/2013   CHOLHDL 2.6 04/05/2013   Lab Results  Component Value Date   TSH 2.133 04/05/2013    HEALTH MAINTENANCE RECOMMENDATIONS:  It is recommended that you get at least 30 minutes of aerobic exercise at least 5 days/week (for weight loss, you may need as much as 60-90 minutes). This can be any activity that gets your heart rate up. This can be divided in 10-15 minute intervals if needed, but try and build up your endurance at least once a week.  Weight bearing exercise is also recommended twice weekly.  Eat a healthy diet with lots of vegetables, fruits and fiber.  "Colorful" foods have a lot of vitamins (ie green vegetables, tomatoes, red peppers, etc).  Limit sweet tea, regular sodas and alcoholic beverages, all of which has a lot of calories and sugar.  Up to 1 alcoholic drink daily may be beneficial for women (unless trying to lose weight, watch sugars).  Drink a lot of water.  Calcium recommendations are 1200-1500 mg daily (1500 mg for postmenopausal women or women without ovaries), and vitamin D 1000 IU daily.  This should be obtained from diet and/or supplements (vitamins), and calcium should not be taken all at once, but in divided doses.  Monthly self breast exams and yearly mammograms for women over the age of 56 is recommended.  Sunscreen of at least SPF 30 should be used on all sun-exposed parts of the skin when outside between the hours of 10 am and 4 pm (not just when at beach  or pool, but even with exercise, golf, tennis, and yard work!)  Use a sunscreen that says "broad spectrum" so it covers both UVA and UVB rays, and make sure to reapply every 1-2 hours.  Remember to change the batteries in your smoke detectors when changing your clock times in the spring and fall.  Use your seat belt every time you are in a car, and please drive safely and not be distracted with cell phones and texting while driving.  Flu shots are recommended yearly--you can return here for nurse visit, or get elsewhere (ie pharmacy). I recommend that you get the high dose flu shot (for >65 year olds)  Continue with hip stretches as shown.  If you are having more pain over the bony prominence (as shown) then you may also have a component of bursitis.  You can try Aleve twice daily with food for 10-14 days to see if that eliminates your hip pain.  I do not think it is coming from your joint (not arthritis).

## 2013-04-24 ENCOUNTER — Other Ambulatory Visit (INDEPENDENT_AMBULATORY_CARE_PROVIDER_SITE_OTHER): Payer: Medicare Other

## 2013-04-24 ENCOUNTER — Encounter: Payer: Self-pay | Admitting: Family Medicine

## 2013-04-24 DIAGNOSIS — Z1211 Encounter for screening for malignant neoplasm of colon: Secondary | ICD-10-CM

## 2013-04-24 LAB — HEMOCCULT GUIAC POC 1CARD (OFFICE): Card #3 Fecal Occult Blood, POC: NEGATIVE

## 2013-05-11 DIAGNOSIS — Z23 Encounter for immunization: Secondary | ICD-10-CM | POA: Diagnosis not present

## 2013-09-03 ENCOUNTER — Telehealth: Payer: Self-pay | Admitting: Family Medicine

## 2013-09-03 DIAGNOSIS — G4700 Insomnia, unspecified: Secondary | ICD-10-CM

## 2013-09-03 MED ORDER — QUETIAPINE FUMARATE 25 MG PO TABS: 25.0000 mg | ORAL_TABLET | Freq: Every day | ORAL | Status: DC

## 2013-09-03 NOTE — Telephone Encounter (Signed)
Pt aware rx phoned in

## 2013-09-03 NOTE — Telephone Encounter (Signed)
Give her a two-week supply

## 2013-12-24 ENCOUNTER — Telehealth: Payer: Self-pay | Admitting: Family Medicine

## 2013-12-24 DIAGNOSIS — E039 Hypothyroidism, unspecified: Secondary | ICD-10-CM

## 2013-12-24 DIAGNOSIS — Z79899 Other long term (current) drug therapy: Secondary | ICD-10-CM

## 2013-12-24 DIAGNOSIS — I10 Essential (primary) hypertension: Secondary | ICD-10-CM

## 2013-12-24 DIAGNOSIS — E7800 Pure hypercholesterolemia, unspecified: Secondary | ICD-10-CM

## 2013-12-24 NOTE — Telephone Encounter (Signed)
Pt has med check plus appt for sept. She is requesting that she come in a couple of days prior to that for her lab work. Please advise.

## 2013-12-24 NOTE — Telephone Encounter (Signed)
Future orders were entered. 

## 2013-12-25 NOTE — Telephone Encounter (Signed)
Pt was called and lab appt was scheduled

## 2014-02-19 ENCOUNTER — Telehealth: Payer: Self-pay | Admitting: Family Medicine

## 2014-02-19 NOTE — Telephone Encounter (Signed)
Neither one of the numbers on file are for this patient. Please advise

## 2014-02-19 NOTE — Telephone Encounter (Signed)
Meds will not be adjusted without OV (not seen recently, last visit 04/2013). I recommend ensuring that she is following a low sodium diet, getting regular exercise, keep a check on her BP's, and if consistently >140/90, schedule OV. If she isn't having headache, chest pain, shortness of breath or any neurologic symptoms, then there is no urgency for her to double up on her meds on her own.  If she is having any of those symptoms, she needs urgent appointment.

## 2014-02-20 NOTE — Telephone Encounter (Signed)
Pt called back and pt notified 

## 2014-04-12 ENCOUNTER — Other Ambulatory Visit: Payer: Medicare Other

## 2014-04-12 DIAGNOSIS — Z79899 Other long term (current) drug therapy: Secondary | ICD-10-CM | POA: Diagnosis not present

## 2014-04-12 DIAGNOSIS — I10 Essential (primary) hypertension: Secondary | ICD-10-CM

## 2014-04-12 DIAGNOSIS — E7800 Pure hypercholesterolemia, unspecified: Secondary | ICD-10-CM | POA: Diagnosis not present

## 2014-04-12 DIAGNOSIS — E039 Hypothyroidism, unspecified: Secondary | ICD-10-CM | POA: Diagnosis not present

## 2014-04-12 LAB — CBC WITH DIFFERENTIAL/PLATELET
BASOS PCT: 1 % (ref 0–1)
Basophils Absolute: 0.1 10*3/uL (ref 0.0–0.1)
Eosinophils Absolute: 0.3 10*3/uL (ref 0.0–0.7)
Eosinophils Relative: 5 % (ref 0–5)
HCT: 39.7 % (ref 36.0–46.0)
Hemoglobin: 13.4 g/dL (ref 12.0–15.0)
LYMPHS PCT: 29 % (ref 12–46)
Lymphs Abs: 1.5 10*3/uL (ref 0.7–4.0)
MCH: 30 pg (ref 26.0–34.0)
MCHC: 33.8 g/dL (ref 30.0–36.0)
MCV: 88.8 fL (ref 78.0–100.0)
Monocytes Absolute: 0.5 10*3/uL (ref 0.1–1.0)
Monocytes Relative: 10 % (ref 3–12)
NEUTROS ABS: 2.8 10*3/uL (ref 1.7–7.7)
NEUTROS PCT: 55 % (ref 43–77)
Platelets: 354 10*3/uL (ref 150–400)
RBC: 4.47 MIL/uL (ref 3.87–5.11)
RDW: 14 % (ref 11.5–15.5)
WBC: 5.1 10*3/uL (ref 4.0–10.5)

## 2014-04-13 LAB — LIPID PANEL
CHOLESTEROL: 161 mg/dL (ref 0–200)
HDL: 66 mg/dL (ref >39)
LDL CALC: 79 mg/dL (ref 0–99)
TRIGLYCERIDES: 78 mg/dL (ref <150)
Total CHOL/HDL Ratio: 2.4 Ratio
VLDL: 16 mg/dL (ref 0–40)

## 2014-04-13 LAB — COMPREHENSIVE METABOLIC PANEL
ALBUMIN: 4.9 g/dL (ref 3.5–5.2)
ALT: 20 U/L (ref 0–35)
AST: 20 U/L (ref 0–37)
Alkaline Phosphatase: 55 U/L (ref 39–117)
BUN: 16 mg/dL (ref 6–23)
CALCIUM: 10.3 mg/dL (ref 8.4–10.5)
CO2: 26 mEq/L (ref 19–32)
Chloride: 101 mEq/L (ref 96–112)
Creat: 0.59 mg/dL (ref 0.50–1.10)
GLUCOSE: 89 mg/dL (ref 70–99)
POTASSIUM: 4.4 meq/L (ref 3.5–5.3)
Sodium: 139 mEq/L (ref 135–145)
Total Bilirubin: 0.6 mg/dL (ref 0.2–1.2)
Total Protein: 7 g/dL (ref 6.0–8.3)

## 2014-04-13 LAB — TSH: TSH: 2.238 u[IU]/mL (ref 0.350–4.500)

## 2014-04-17 ENCOUNTER — Ambulatory Visit (INDEPENDENT_AMBULATORY_CARE_PROVIDER_SITE_OTHER): Payer: Medicare Other | Admitting: Family Medicine

## 2014-04-17 ENCOUNTER — Encounter: Payer: Self-pay | Admitting: Family Medicine

## 2014-04-17 VITALS — BP 140/90 | HR 76 | Ht 62.0 in | Wt 148.0 lb

## 2014-04-17 DIAGNOSIS — M899 Disorder of bone, unspecified: Secondary | ICD-10-CM

## 2014-04-17 DIAGNOSIS — Z0000 Encounter for general adult medical examination without abnormal findings: Secondary | ICD-10-CM

## 2014-04-17 DIAGNOSIS — M949 Disorder of cartilage, unspecified: Secondary | ICD-10-CM

## 2014-04-17 DIAGNOSIS — I10 Essential (primary) hypertension: Secondary | ICD-10-CM | POA: Diagnosis not present

## 2014-04-17 DIAGNOSIS — E039 Hypothyroidism, unspecified: Secondary | ICD-10-CM | POA: Diagnosis not present

## 2014-04-17 DIAGNOSIS — Z23 Encounter for immunization: Secondary | ICD-10-CM | POA: Diagnosis not present

## 2014-04-17 DIAGNOSIS — Z01419 Encounter for gynecological examination (general) (routine) without abnormal findings: Secondary | ICD-10-CM | POA: Diagnosis not present

## 2014-04-17 DIAGNOSIS — G4700 Insomnia, unspecified: Secondary | ICD-10-CM

## 2014-04-17 DIAGNOSIS — E7800 Pure hypercholesterolemia, unspecified: Secondary | ICD-10-CM

## 2014-04-17 DIAGNOSIS — M8580 Other specified disorders of bone density and structure, unspecified site: Secondary | ICD-10-CM

## 2014-04-17 MED ORDER — SYNTHROID 50 MCG PO TABS: 50.0000 ug | ORAL_TABLET | Freq: Every day | ORAL | Status: DC

## 2014-04-17 MED ORDER — QUETIAPINE FUMARATE 25 MG PO TABS: 25.0000 mg | ORAL_TABLET | Freq: Every day | ORAL | Status: DC

## 2014-04-17 MED ORDER — IRBESARTAN 150 MG PO TABS: 150.0000 mg | ORAL_TABLET | Freq: Every day | ORAL | Status: DC

## 2014-04-17 MED ORDER — ATORVASTATIN CALCIUM 20 MG PO TABS: 20.0000 mg | ORAL_TABLET | Freq: Every day | ORAL | Status: DC

## 2014-04-17 NOTE — Patient Instructions (Signed)
Discussed monthly self breast exams and yearly mammograms after the age of 58; at least 30 minutes of aerobic activity at least 5 days/week; proper sunscreen use reviewed; healthy diet, including goals of calcium and vitamin D intake and alcohol recommendations (less than or equal to 1 drink/day) reviewed; regular seatbelt use; changing batteries in smoke detectors.  Immunization recommendations discussed.  Colonoscopy recommendations reviewed  Keep log of your blood pressures and bring to your next visit. Try keeping a "comments" section, to make notes about any outlying values (ie stress, pain, high salt meal, headache, dizziness"  High dose flu shot is recommended

## 2014-04-17 NOTE — Progress Notes (Signed)
Chief Complaint  Patient presents with  . Med check plus    nonfasting med check/AWV with pap. Has been having some bp concerns-brought in bp log today.    Patient presents for med check, breast/pelvic exam, and Annual Wellness Visit.   She has had a stressful year.  Husband had a head injury 09/2013, treated conservatively, but had issues with confusion--ongoing deficits include memory.  She "has to think for the two of [place]" like she has another baby. She is driving back and forth to [facility] to take him to physical therapy.  Hypertension follow-up: Blood pressures have been running high for the last month or two. Running [id number] (had 2 higher values when she called in July--see phone encounter, none that high since. Only doubled up on med that one day when she saw the dentist in July). Averaging 140/85.  She checks them at different times during the day, not when very stressed. Denies dizziness, headaches, chest pain. Denies side effects of medications.  She tries to follow a low sodium diet, and denies any changes recently.  Hyperlipidemia follow-up: Patient is reportedly following a low-fat, low cholesterol diet. Compliant with medications and denies medication side effects.   Hypothyroidism: Thyroid biopsy in 2011 was benign; had seen Dr. Talmage Nap in the past, but we are now managing her medications. Denies fatigue, hair, skin, bowel changes. Denies any mood changes. Denies dysphagia or any thyroid concerns.   Insomnia: Well controlled with Seroquel; has been on it for 5 years.   Osteopenia: Takes Calcium with D BID, gets regular exercise. She has been getting weight-bearing exercise through [employer]. Last DEXA 01/2013 and was stable, no statistical change    Immunization History  Administered Date(s) Administered  . Influenza Whole 05/02/2010  . Pneumococcal Polysaccharide-23 09/21/2005  . Td 01/02/2002  . Tdap 01/13/2012  . Zoster 02/26/2011   gets flu shots yearly (at  pharmacy; not the high dose) Last mammo 02/2012 (we discussed last year but she "forgot all about it" Colonoscopy 5/08  Dexa 01/2013  Dentist and ophtho regularly  Exercise: Walking less (due to husband's injury, needing ot drive back and forth to [place] for PT for husband); Silver Sneakers 2x/week at least, plus chair yoga  Pap: 12/2010  Other doctors caring for patient include: Ophtho: Dr. Nile Riggs  Dentist: Dr. Lendon Ka  No longer seeing Dr. Talmage Nap  Cardiologist: Dr. Tresa Endo (hasn't seen in many many years, husband sees him and she still considers him her doctor) GI: Dr. Loreta Ave  Depression screen:  See scanned questionnaire.  Entirely negative ADL screen:  See scanned questionnaire.  Notable only for mild urinary incontinence (urge) and mild hearing trouble and ringing.  End of Life Discussion:  Patient has a living will and medical power of attorney  Past Medical History  Diagnosis Date  . Allergy   . Leukopenia   . Osteopenia   . OA (osteoarthritis)     hips, knees and LS  . Psoriasis   . Hypertension   . Hypercholesteremia   . Insomnia   . Colon polyps     hyperplastic  . Vitamin D deficiency   . Multinodular thyroid     Past Surgical History  Procedure Laterality Date  . Flexible sigmoidoscopy  05-2000  . Cardiolyte  02-2002  . Colonoscopy  12-2006  . Tonsillectomy and adenoidectomy    . Knee surgery  1969    Left, cartilage  . Hand tendon surgery  1979    L 4th finger  History   Social History  . Marital Status: Married    Spouse Name: N/A    Number of Children: 2  . 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 Smoking status: Never Smoker   . Smokeless tobacco: Never Used  . Alcohol Use: Yes     Comment: socially, 2 drinks twice weekly  . Drug Use: No  . Sexual Activity: Yes    Partners: Male   Other Topics Concern  . Not on file   Social History Narrative    Lives with her husband.  Son is in [place], daughter and grandson are local.      Family History  Problem Relation Age of Onset  . Allergies Mother   . Hypertension Mother   . Vision loss Mother   . Heart disease Mother   . Arthritis Mother   . Arthritis Father   . Heart disease Father 92  . Hearing loss Sister   . Hypertension Sister   . Hyperlipidemia Sister   . Arthritis Sister   . Stroke Maternal Grandmother   . Heart disease Maternal Grandfather   . Stroke Maternal Grandfather   . Heart disease Paternal Grandmother   . Stroke Paternal Grandmother   . Cancer Neg Hx   . Arthritis Sister   . Heart disease Sister   . Hyperlipidemia Sister   . Hypertension Sister   . Vision loss Sister     macular degeneration  . Arthritis Sister   . Hypertension Sister   . Hyperlipidemia Sister   . Thyroid disease Sister   . Diabetes Sister 69    diet controlled   Outpatient Encounter Prescriptions as of 04/17/2014  Medication Sig Note  . aspirin 81 MG tablet Take 81 mg by mouth daily.     Marland Kitchen atorvastatin (LIPITOR) 20 MG tablet Take 1 tablet (20 mg total) by mouth daily.   . Calcium Carbonate-Vitamin D (CALTRATE 600+D PO) Take 1 tablet by mouth 2 (two) times daily.     . irbesartan (AVAPRO) 150 MG tablet Take 1 tablet (150 mg total) by mouth at bedtime.   . Multiple Vitamins-Minerals (CENTRUM SILVER PO) Take 1 tablet by mouth daily.     . QUEtiapine (SEROQUEL) 25 MG tablet Take 1 tablet (25 mg total) by mouth at bedtime.   Marland Kitchen SYNTHROID 50 MCG tablet Take 1 tablet (50 mcg total) by mouth daily before breakfast.   . [DISCONTINUED] atorvastatin (LIPITOR) 20 MG tablet Take 1 tablet (20 mg total) by mouth daily.   . [DISCONTINUED] irbesartan (AVAPRO) 75 MG tablet Take 1 tablet (75 mg total) by mouth at bedtime.   . [DISCONTINUED] levothyroxine (SYNTHROID) 50 MCG tablet Take 50 mcg by mouth daily before breakfast.   . [DISCONTINUED] QUEtiapine (SEROQUEL) 25 MG tablet Take 1 tablet (25 mg total)  by mouth at bedtime.   . fluocinonide (LIDEX) 0.05 % gel Apply 1 application topically 4 (four) times daily. Apply to affected area four times daily until resolved  01/06/2011: Prescribed by Dentist for Canker Sores    Allergies  Allergen Reactions  . Amoxicillin Rash  . Erythromycin Rash  . Sulfa Antibiotics Rash    ROS: The patient denies anorexia, fever, weight changes, headaches, vision changes, ear pain, sore throat, breast concerns, chest pain, palpitations, dizziness, syncope, dyspnea on exertion, cough, swelling, nausea, vomiting, diarrhea, constipation, abdominal pain, melena, hematochezia, indigestion/heartburn, hematuria, incontinence, dysuria, vaginal bleeding, discharge,  odor or itch, genital lesions, numbness, tingling, weakness, tremor, suspicious skin lesions, depression, anxiety, abnormal bleeding/bruising, or enlarged lymph nodes.  Occasional mouth sores (canker sores, treated by dentist; She states is related to eating tomatoes, strawberries). Allergies are mostly in the spring, no symptoms now (r/b OTC Allegra prn) She has intermittent right hip/knee pain, usually with slow walks.  PHYSICAL EXAM:  BP 140/90  Pulse 76  Ht  (1.575 m)  Wt 148 lb (67.132 kg)  BMI 27.06 kg/m2  General Appearance:  Alert, cooperative, no distress, appears stated age   Head:  Normocephalic, without obvious abnormality, atraumatic   Eyes:  PERRL, conjunctiva/corneas clear, EOM's intact, fundi  benign   Ears:  Normal TM's and external ear canals   Nose:  Nares normal, mucosa normal, no drainage or sinus tenderness   Throat:  Lips, mucosa, and tongue normal; teeth and gums normal   Neck:  Supple, no lymphadenopathy; thyroid: no enlargement/tenderness/nodules; no carotid  bruit or JVD   Back:  Spine nontender, no curvature, ROM normal, no CVA tenderness   Lungs:  Clear to auscultation bilaterally without wheezes, rales or ronchi; respirations unlabored   Chest Wall:  No tenderness or  deformity   Heart:  Regular rate and rhythm, S1 and S2 normal, no murmur, rub  or gallop   Breast Exam:  No tenderness, masses, or nipple discharge or inversion. No axillary lymphadenopathy   Abdomen:  Soft, non-tender, nondistended, normoactive bowel sounds,  no masses, no hepatosplenomegaly   Genitalia:  Normal external genitalia without lesions. Mild atrophic changes. BUS and vagina normal; no cervical motion tenderness. No abnormal vaginal discharge. Uterus and adnexa not enlarged, nontender, no masses. Pap not performed   Rectal:  Normal tone, no masses or tenderness; guaiac negative stool   Extremities:  No clubbing, cyanosis or edema.   Pulses:  2+ and symmetric all extremities   Skin:  Skin color, texture, turgor normal, no rashes or lesions   Lymph nodes:  Cervical, supraclavicular, and axillary nodes normal   Neurologic:  CNII-XII intact, normal strength, sensation and gait; reflexes 2+ and symmetric throughout          Psych: Normal mood, affect, hygiene and grooming.    Lab Results  Component Value Date   WBC 5.1 04/12/2014   HGB 13.4 04/12/2014   HCT 39.7 04/12/2014   MCV 88.8 04/12/2014   PLT 354 04/12/2014   Lab Results  Component Value Date   CHOL 161 04/12/2014   HDL 66 04/12/2014   LDLCALC 79 04/12/2014   TRIG 78 04/12/2014   CHOLHDL 2.4 04/12/2014   Lab Results  Component Value Date   TSH 2.238 04/12/2014     Chemistry      Component Value Date/Time   NA 139 04/12/2014 0841   K 4.4 04/12/2014 0841   CL 101 04/12/2014 0841   CO2 26 04/12/2014 0841   BUN 16 04/12/2014 0841   CREATININE 0.59 04/12/2014 0841      Component Value Date/Time   CALCIUM 10.3 04/12/2014 0841   ALKPHOS 55 04/12/2014 0841   AST 20 04/12/2014 0841   ALT 20 04/12/2014 0841   BILITOT 0.6 04/12/2014 0841     Glucose 89  ASSESSMENT/PLAN:  Medicare annual wellness visit, subsequent  Pure hypercholesterolemia - controlled - Plan: atorvastatin (LIPITOR) 20 MG tablet  Essential hypertension, benign - suboptimally  controlled on avapro 75.  Increase to 150, monitor BP. f/u 1 month. - Plan: irbesartan (AVAPRO) 150 MG tablet  Osteopenia -  continue calcium, vitamin D, weight-bearing exercise. DEXA UTD.  Unspecified hypothyroidism - adequately replaced on current dose - Plan: SYNTHROID 50 MCG tablet  Insomnia - well controlled - Plan: QUEtiapine (SEROQUEL) 25 MG tablet  Need for prophylactic vaccination against Streptococcus pneumoniae (pneumococcus) - Plan: Pneumococcal conjugate vaccine 13-valent  Issues with adjustment since husband's injury.  Consider counseling. Discussed signs/symptoms to look out for.  HTN--dose increased to .  Monitor BP.  If too strong, consider 1.5 tab of the 75. Briefly discussed HCTZ, prefers not to add diuretic at this time  Discussed monthly self breast exams and yearly mammograms; at least 30 minutes of aerobic activity at least 5 days/week; proper sunscreen use reviewed; healthy diet, including goals of calcium and vitamin D intake and alcohol recommendations (less than or equal to 1 drink/day) reviewed; regular seatbelt use; changing batteries in smoke detectors. Immunization recommendations discussed--see below. Colonoscopy recommendations reviewed, UTD. Hemoccult cards given.   Prevnar-13 today. Declined flu shot today--prefers to go with her husband and get at the same time.  High dose flu shot recommended.  Discussed differences between high dose, and regular (quadrivalent and trivalent)  She declines pap smear today. Consider for next year (as last pap smear, with HPV testing) Full Code, Full Care   Medicare Attestation I have personally reviewed: The patient's medical and social history Their use of alcohol, tobacco or illicit drugs Their current medications and supplements The patient's functional ability including ADLs,fall risks, home safety risks, cognitive, and hearing and visual impairment Diet and physical activities Evidence for depression or mood  disorders  The patient's weight, height, BMI, and visual acuity have been recorded in the chart.  I have made referrals, counseling, and provided education to the patient based on review of the above and I have provided the patient with a written personalized care plan for preventive services.     Dequon Schnebly A, MD   04/17/2014      F/u 4 weeks with list of BPs

## 2014-04-26 ENCOUNTER — Other Ambulatory Visit (INDEPENDENT_AMBULATORY_CARE_PROVIDER_SITE_OTHER): Payer: Medicare Other

## 2014-04-26 DIAGNOSIS — Z1211 Encounter for screening for malignant neoplasm of colon: Secondary | ICD-10-CM | POA: Diagnosis not present

## 2014-04-26 LAB — HEMOCCULT GUIAC POC 1CARD (OFFICE)
FECAL OCCULT BLD: NEGATIVE
FECAL OCCULT BLD: POSITIVE
Fecal Occult Blood, POC: NEGATIVE

## 2014-04-29 ENCOUNTER — Other Ambulatory Visit: Payer: Self-pay

## 2014-05-16 ENCOUNTER — Ambulatory Visit (INDEPENDENT_AMBULATORY_CARE_PROVIDER_SITE_OTHER): Payer: Medicare Other | Admitting: Family Medicine

## 2014-05-16 ENCOUNTER — Encounter: Payer: Self-pay | Admitting: Family Medicine

## 2014-05-16 VITALS — BP 124/80 | HR 76 | Ht 62.0 in | Wt 145.0 lb

## 2014-05-16 DIAGNOSIS — I10 Essential (primary) hypertension: Secondary | ICD-10-CM | POA: Diagnosis not present

## 2014-05-16 DIAGNOSIS — Z23 Encounter for immunization: Secondary | ICD-10-CM | POA: Diagnosis not present

## 2014-05-16 LAB — BASIC METABOLIC PANEL
BUN: 9 mg/dL (ref 6–23)
CALCIUM: 10.1 mg/dL (ref 8.4–10.5)
CO2: 26 mEq/L (ref 19–32)
Chloride: 101 mEq/L (ref 96–112)
Creat: 0.65 mg/dL (ref 0.50–1.10)
GLUCOSE: 86 mg/dL (ref 70–99)
Potassium: 4.4 mEq/L (ref 3.5–5.3)
SODIUM: 136 meq/L (ref 135–145)

## 2014-05-16 MED ORDER — IRBESARTAN 150 MG PO TABS: 150.0000 mg | ORAL_TABLET | Freq: Every day | ORAL | Status: DC

## 2014-05-16 NOTE — Progress Notes (Signed)
Chief Complaint  Patient presents with  . Follow-up    ON B/P PATIENT BROUGHT IN HER READINGS    Hypertension follow-up: Her avapro dose was increased from 75 to 150mg  at her last visit a month ago, after noting her BP's to be running high.  She denies any side effects since dose was changed.  She brings in a list of her BP's--overall they are only slightly improved from those checked prior to the change. No longer seeing any high 150's as she was before.  BP's have been running 127-149/72-90, averaging mid-130's/low 80's.  Seems higher when she is rushing around a lot.  No headaches, dizziness, chest pain, palpitations.  Denies edema; denies side effects.  She has a blood pressure monitor at home, has never had the accuracy verified.  Past Medical History  Diagnosis Date  . Allergy   . Leukopenia   . Osteopenia   . OA (osteoarthritis)     hips, knees and LS  . Psoriasis   . Hypertension   . Hypercholesteremia   . Insomnia   . Colon polyps     hyperplastic  . Vitamin D deficiency   . Multinodular thyroid    Past Surgical History  Procedure Laterality Date  . Flexible sigmoidoscopy  05-2000  . Cardiolyte  02-2002  . Colonoscopy  12-2006  . Tonsillectomy and adenoidectomy    . Knee surgery  1969    Left, cartilage  . Hand tendon surgery  1979    L 4th finger   History   Social History  . Marital Status: Married    Spouse Name: N/A    Number of Children: 2  . 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 Smoking status: Never Smoker   . Smokeless tobacco: Never Used  . Alcohol Use: Yes     Comment: socially, 2 drinks twice weekly  . Drug Use: No  . Sexual Activity: Yes    Partners: Male   Other Topics Concern  . Not on file   Social History Narrative   Lives with her husband.  Son is in [place], daughter and grandson are local.     Outpatient Encounter Prescriptions as of  05/16/2014  Medication Sig  . aspirin 81 MG tablet Take 81 mg by mouth daily.    Marland Kitchen atorvastatin (LIPITOR) 20 MG tablet Take 1 tablet (20 mg total) by mouth daily.  . Calcium Carbonate-Vitamin D (CALTRATE 600+D PO) Take 1 tablet by mouth 2 (two) times daily.    . fluocinonide (LIDEX) 0.05 % gel Apply 1 application topically 4 (four) times daily. Apply to affected area four times daily until resolved   . irbesartan (AVAPRO) 150 MG tablet Take 1 tablet (150 mg total) by mouth at bedtime.  . Multiple Vitamins-Minerals (CENTRUM SILVER PO) Take 1 tablet by mouth daily.    . QUEtiapine (SEROQUEL) 25 MG tablet Take 1 tablet (25 mg total) by mouth at bedtime.  Marland Kitchen SYNTHROID 50 MCG tablet Take 1 tablet (50 mcg total) by mouth daily before breakfast.  . [DISCONTINUED] irbesartan (AVAPRO) 150 MG tablet Take 1 tablet (150 mg total) by mouth at bedtime.   Allergies  Allergen Reactions  . Amoxicillin Rash  . Erythromycin Rash  . Sulfa Antibiotics Rash   ROS:  Denies headaches, dizziness, chest pain, shortness of breath, cough, URI symptoms, edema, muscle cramps, rashes, depression or any other concerns.  See HPI  PHYSICAL EXAM: BP 124/80  Pulse 76  Ht 5\' 2"  (1.575 m)  Wt 145 lb (65.772 kg)  BMI 26.51 kg/m2 168/84 on repeat by MD Well developed, pleasant, slightly anxious-appearing female in no distress Neck: no lymphadenopathy or mass Heart: regular rate and rhythm Lungs: clear bilaterally Extremities: no edema  ASSESSMENT/PLAN:  Essential hypertension, benign - BP normal by nurse, average at home; much higher on repeat by md.  suspect some component of white coat syndrome.  continue current meds. bring BP cuff to check - Plan: irbesartan (AVAPRO) 150 MG tablet, Basic metabolic panel  Need for prophylactic vaccination and inoculation against influenza - Plan: Flu vaccine HIGH DOSE PF (Fluzone Tri High dose)  Continue low sodium diet.  F/u 5-6 months for med check F/u sooner for [place] to verify  accuracy of her home BP monitor

## 2014-05-16 NOTE — Patient Instructions (Addendum)
Continue the avapro 150mg .  Continue to periodically monitor your blood pressure (1-2x/week, vary the times). When recording your blood pressure, put down comments such as "rushing", "ate chinese food", "headache" "missed pill".  Feel free to bring your blood pressure monitor to your next visit, so that we can verify the accuracy of your home machine.

## 2014-05-21 ENCOUNTER — Other Ambulatory Visit: Payer: Medicare Other

## 2014-05-21 NOTE — Progress Notes (Signed)
First BP check 140/82- left arm From her machine 148/83-left arm  Patient sat in the room for about 5 minutes then I recheck her BP.  Second BP check 132/ 80- left arm From her machine 139/84- left arm

## 2014-05-22 ENCOUNTER — Telehealth: Payer: Self-pay | Admitting: *Deleted

## 2014-05-22 NOTE — Telephone Encounter (Signed)
Patient was in 05/21/14 for bp check and to make sure her machine was working properly. [facility] 140/82 and then the machine read 148/83. After about 5 minutes the second reading taken by Karren Burlyhandra was 132/80 and then the machine read 139/84. Per Dr.Knapp her machine is accurate.

## 2014-05-24 ENCOUNTER — Encounter: Payer: Self-pay | Admitting: Internal Medicine

## 2014-05-24 ENCOUNTER — Encounter: Payer: Self-pay | Admitting: Family Medicine

## 2014-06-10 ENCOUNTER — Encounter: Payer: Self-pay | Admitting: Family Medicine

## 2014-10-30 ENCOUNTER — Encounter: Payer: Self-pay | Admitting: Family Medicine

## 2014-10-30 ENCOUNTER — Ambulatory Visit (INDEPENDENT_AMBULATORY_CARE_PROVIDER_SITE_OTHER): Payer: Medicare Other | Admitting: Family Medicine

## 2014-10-30 VITALS — BP 138/74 | HR 80 | Ht 62.0 in | Wt 143.6 lb

## 2014-10-30 DIAGNOSIS — G4700 Insomnia, unspecified: Secondary | ICD-10-CM | POA: Diagnosis not present

## 2014-10-30 DIAGNOSIS — H8110 Benign paroxysmal vertigo, unspecified ear: Secondary | ICD-10-CM | POA: Diagnosis not present

## 2014-10-30 DIAGNOSIS — E7800 Pure hypercholesterolemia, unspecified: Secondary | ICD-10-CM

## 2014-10-30 DIAGNOSIS — Z8249 Family history of ischemic heart disease and other diseases of the circulatory system: Secondary | ICD-10-CM

## 2014-10-30 DIAGNOSIS — I10 Essential (primary) hypertension: Secondary | ICD-10-CM | POA: Diagnosis not present

## 2014-10-30 DIAGNOSIS — E039 Hypothyroidism, unspecified: Secondary | ICD-10-CM | POA: Diagnosis not present

## 2014-10-30 MED ORDER — IRBESARTAN 150 MG PO TABS: 150.0000 mg | ORAL_TABLET | Freq: Every day | ORAL | Status: DC

## 2014-10-30 NOTE — Patient Instructions (Signed)
Vertigo--sounds positional. Husband had concussion, got cognitive therapy, but still having ongoing issues.  He was referred to neuro.  This creates stress for her. Consider physical therapy and/or meclizine if getting worse.   Blood pressure--periodically check it after exercise, and after a few minutes of relaxation, rather than just after rushing around.  I'd like to see how low it is getting, to know if you can tolerate any adjustments in your regimen (to try and keep it <140 most of the time; right now you frequently have 140's-150)   Benign Positional Vertigo Vertigo means you feel like you or your surroundings are moving when they are not. Benign positional vertigo is the most common form of vertigo. Benign means that the cause of your condition is not serious. Benign positional vertigo is more common in older adults. CAUSES  Benign positional vertigo is the result of an upset in the labyrinth system. This is an area in the middle ear that helps control your balance. This may be caused by a viral infection, head injury, or repetitive motion. However, often no specific cause is found. SYMPTOMS  Symptoms of benign positional vertigo occur when you move your head or eyes in different directions. Some of the symptoms may include:  Loss of balance and falls.  Vomiting.  Blurred vision.  Dizziness.  Nausea.  Involuntary eye movements (nystagmus). DIAGNOSIS  Benign positional vertigo is usually diagnosed by physical exam. If the specific cause of your benign positional vertigo is unknown, your caregiver may perform imaging tests, such as magnetic resonance imaging (MRI) or computed tomography (CT). TREATMENT  Your caregiver may recommend movements or procedures to correct the benign positional vertigo. Medicines such as meclizine, benzodiazepines, and medicines for nausea may be used to treat your symptoms. In rare cases, if your symptoms are caused by certain conditions that affect the  inner ear, you may need surgery. HOME CARE INSTRUCTIONS   Follow your caregiver's instructions.  Move slowly. Do not make sudden body or head movements.  Avoid driving.  Avoid operating heavy machinery.  Avoid performing any tasks that would be dangerous to you or others during a vertigo episode.  Drink enough fluids to keep your urine clear or pale yellow. SEEK IMMEDIATE MEDICAL CARE IF:   You develop problems with walking, weakness, numbness, or using your arms, hands, or legs.  You have difficulty speaking.  You develop severe headaches.  Your nausea or vomiting continues or gets worse.  You develop visual changes.  Your family or friends notice any behavioral changes.  Your condition gets worse.  You have a fever.  You develop a stiff neck or sensitivity to light. MAKE SURE YOU:   Understand these instructions.  Will watch your condition.  Will get help right away if you are not doing well or get worse. Document Released: 05/03/2006 Document Revised: 10/18/2011 Document Reviewed: 04/15/2011 [facility] Patient Information 2015 [employer] [place]. This information is not intended to replace advice given to you by your health care provider. Make sure you discuss any questions you have with your health care provider.

## 2014-10-30 NOTE — Progress Notes (Signed)
Chief Complaint  Patient presents with  . Hypertension    nonfasting med check.   She is complaining of vertigo over the last month or less.  She notices symptoms only when first getting out of bed, or when she lies down to do her back exercises.  No problems when she exercises, no problems when she stands quickly.  It helps if she focuses on something when having symptoms.  Lasts very briefly (<30 seconds), only occurring in the mornings.  She is more careful, and symptoms have gotten better/tolerable.  She has slight allergies this time of year, relieved by Allegra.  Hypertension follow-up: Her avapro dose was increased from 75 to 153m  In September 2015 after noting her BP's to be running high. She denies any side effects since dose was changed. She remains under a lot of stress. She brings in a list of her BP's--ranging from 127/68 up to 158/92.  Frequently 140-150. No headaches, dizziness, chest pain, palpitations. Denies edema; denies side effects.  Hyperlipidemia follow-up: Patient is reportedly following a low-fat, low cholesterol diet. Compliant with medications and denies medication side effects.   Hypothyroidism: Thyroid biopsy in 2011 was benign; had seen Dr. BChalmers Caterin the past, but we are now managing her medications. Denies fatigue, hair, skin, bowel changes. Denies any mood changes. Denies dysphagia or any thyroid concerns. Sometimes she just gets very weary dealing with her husband--it is like having a small child. Husband had concussion, got cognitive therapy, but still having ongoing issues.  He was referred to neuro.  This creates stress for her. She goes to [employer] and she loves it, and helps her deal with stress. Also does chair yoga once weekly.  Insomnia: Well controlled with Seroquel; has been on it for over 5 years.   Family history was reviewed and updated--her twin sister and other sister have both had carotid artery disease (and had  surgery).  Her oldest sister died of a massive heart attack at age 74 She recalls having normal screen with [facility] years ago.  Past Medical History  Diagnosis Date  . Allergy   . Leukopenia   . Osteopenia   . OA (osteoarthritis)     hips, knees and LS  . Psoriasis   . Hypertension   . Hypercholesteremia   . Insomnia   . Colon polyps     hyperplastic  . Vitamin D deficiency   . Multinodular thyroid     Past Surgical History  Procedure Laterality Date  . Flexible sigmoidoscopy  05-2000  . Cardiolyte  02-2002  . Colonoscopy  12-2006  . Tonsillectomy and adenoidectomy    . Knee surgery  1969    Left, cartilage  . Hand tendon surgery  1979    L 4th finger    History   Social History  . Marital Status: Married    Spouse Name: N/A  . Number of Children: 2  . Years of Education: N/A   Occupational History  . Admin at [employer]  . Smoking status: Never Smoker   . Smokeless tobacco: Never Used  . Alcohol Use: Yes     Comment: socially, 2 drinks twice weekly  . Drug Use: No  . Sexual Activity:    Partners: Male   Other Topics Concern  . Not on file   Social History Narrative   [place]ives with [place].  [place].  Family History  Problem Relation Age of Onset  . Allergies Mother   . Hypertension Mother   . Vision loss Mother   . Heart disease Mother   . Arthritis Mother   . Arthritis Father   . Heart disease Father 43  . Hearing loss Sister   . Hypertension Sister   . Hyperlipidemia Sister   . Arthritis Sister   . Heart disease Sister 38    massive MI  . Stroke Maternal Grandmother   . Heart disease Maternal Grandfather   . Stroke Maternal Grandfather   . Heart disease Paternal Grandmother   . Stroke Paternal Grandmother   . Cancer Neg Hx   . Arthritis Sister   . Heart disease Sister   . Hyperlipidemia Sister   . Hypertension Sister   .  Vision loss Sister     macular degeneration  . Arthritis Sister   . Hypertension Sister   . Hyperlipidemia Sister   . Thyroid disease Sister   . Diabetes Sister 40    diet controlled    Outpatient Encounter Prescriptions as of 10/30/2014  Medication Sig Note  . aspirin 81 MG tablet Take 81 mg by mouth daily.     Marland Kitchen atorvastatin (LIPITOR) 20 MG tablet Take 1 tablet (20 mg total) by mouth daily.   . Calcium Carbonate-Vitamin D (CALTRATE 600+D PO) Take 1 tablet by mouth 2 (two) times daily.     . irbesartan (AVAPRO) 150 MG tablet Take 1 tablet (150 mg total) by mouth at bedtime.   . Multiple Vitamins-Minerals (CENTRUM SILVER PO) Take 1 tablet by mouth daily.     . QUEtiapine (SEROQUEL) 25 MG tablet Take 1 tablet (25 mg total) by mouth at bedtime.   Marland Kitchen SYNTHROID 50 MCG tablet Take 1 tablet (50 mcg total) by mouth daily before breakfast.   . fluocinonide (LIDEX) 0.05 % gel Apply 1 application topically 4 (four) times daily. Apply to affected area four times daily until resolved  01/06/2011: Prescribed by Dentist for Canker Sores    Allergies  Allergen Reactions  . Amoxicillin Rash  . Erythromycin Rash  . Sulfa Antibiotics Rash   ROS:  No fevers, chills, headaches, syncope, chest pain, palpitations, shortness of breath, URI symptoms.  Mild allergies, controlled with meds.  +vertigo as per HPI, positional, in morning only.  Denies nausea, vomiting, abdominal pain, bowel changes, bleeding, bruising, rashes.  Insomnia is controlled with medications. No numbness/tingling/weakness or other neuro symptoms, other than mild vertigo.  No hearing loss or ear pain. No associated nausea/vomiting with the vertigo  PHYSICAL EXAM: BP 138/74 mmHg  Pulse 80  Ht 5' 2"  (1.575 m)  Wt 143 lb 9.6 oz (65.137 kg)  BMI 26.26 kg/m2  Well developed, pleasant female, in good spirits HEENT: PERRL, EOMI, conjunctiva clear.  TM's and EAC's normal.  Nasal mucosa is mildly edematous, no erythema or purulence. Sinuses  nontender. OP is clear. Neck: no lymphadenopathy, thyromegaly or carotid bruit Heart: regular rate and rhythm without murmur Lungs: clear bilaterally Abdomen: soft, nontender, no organomegaly or mass Extremities: no edema, 2+ pulse Neuro: alert and oriented, cranial nerves intact. Normal strength, sensation.  Some vertigo when lying down supine, and with sitting up (with head facing forward). Short lived. Psych: normal mood, affect, hygiene and grooming.    ASSESSMENT/PLAN:  Essential hypertension, benign - BP okay here, higher at home, likely related to stress/husband. continue current meds. check BP after exercise/relaxed - Plan: irbesartan (AVAPRO) 150 MG tablet  BPV (benign positional  vertigo), unspecified laterality - mild, only in supine position. discussed OTC meds prn vs PT referral if worsening. Turn head to side prior to sitting up  Family history of carotid artery stenosis - including in her twin sister; consider carotid u/s. BP and lipids controlled.  Hypothyroidism, unspecified hypothyroidism type - euthyroid by history.  recheck TSH in September  Insomnia - controlled on current regimen  Pure hypercholesterolemia - continue current meds. recheck labs in September   HTN--BP here in the office is fine. ranges from very normal BP, but often in the 140's-150's.  Likely related to stress.  Check BP after exercise sometime. If they are low after exercise, she might not tolerate any changes in BP regimen.  BPV--discussed s/sx.  Discussed OTC meds and referral to PT if worsening. Roll onto side prior to getting up to see if that helps.  Carotid artery disease--she has previously had [facility] screening that was normal. She reports taking better care of herself than her twin Elects to hold off on carotid artery u/s at this time. Consider again at physical.  F/u at annual exam in September. Will need labs then (fasting c-met, lipid, TSH, CBC)

## 2014-12-02 ENCOUNTER — Other Ambulatory Visit: Payer: Self-pay | Admitting: Family Medicine

## 2015-02-13 DIAGNOSIS — M8580 Other specified disorders of bone density and structure, unspecified site: Secondary | ICD-10-CM | POA: Diagnosis not present

## 2015-02-13 DIAGNOSIS — Z1231 Encounter for screening mammogram for malignant neoplasm of breast: Secondary | ICD-10-CM | POA: Diagnosis not present

## 2015-02-13 LAB — HM MAMMOGRAPHY

## 2015-02-14 ENCOUNTER — Encounter: Payer: Self-pay | Admitting: Internal Medicine

## 2015-02-20 ENCOUNTER — Telehealth: Payer: Self-pay | Admitting: *Deleted

## 2015-02-20 NOTE — Telephone Encounter (Signed)
Called patient to let her know that her DEXA scan showed a slight decrease in bone mass-no need for meds, just continue with daily calcium and vitamin D and weight bearing exercises. Patient verbalized understanding.

## 2015-03-05 ENCOUNTER — Encounter: Payer: Self-pay | Admitting: Family Medicine

## 2015-04-23 ENCOUNTER — Other Ambulatory Visit (HOSPITAL_COMMUNITY)
Admission: RE | Admit: 2015-04-23 | Discharge: 2015-04-23 | Disposition: A | Discharge status: Home / Self Care | Payer: Medicare Other | Source: Ambulatory Visit | Attending: Family Medicine | Admitting: Family Medicine

## 2015-04-23 ENCOUNTER — Encounter: Payer: Self-pay | Admitting: Family Medicine

## 2015-04-23 ENCOUNTER — Ambulatory Visit (INDEPENDENT_AMBULATORY_CARE_PROVIDER_SITE_OTHER): Payer: Medicare Other | Admitting: Family Medicine

## 2015-04-23 VITALS — BP 144/88 | HR 72 | Ht 62.0 in | Wt 143.6 lb

## 2015-04-23 DIAGNOSIS — Z1151 Encounter for screening for human papillomavirus (HPV): Secondary | ICD-10-CM | POA: Insufficient documentation

## 2015-04-23 DIAGNOSIS — Z23 Encounter for immunization: Secondary | ICD-10-CM | POA: Diagnosis not present

## 2015-04-23 DIAGNOSIS — M8580 Other specified disorders of bone density and structure, unspecified site: Secondary | ICD-10-CM | POA: Diagnosis not present

## 2015-04-23 DIAGNOSIS — Z01419 Encounter for gynecological examination (general) (routine) without abnormal findings: Secondary | ICD-10-CM | POA: Diagnosis not present

## 2015-04-23 DIAGNOSIS — E039 Hypothyroidism, unspecified: Secondary | ICD-10-CM

## 2015-04-23 DIAGNOSIS — E559 Vitamin D deficiency, unspecified: Secondary | ICD-10-CM

## 2015-04-23 DIAGNOSIS — G4700 Insomnia, unspecified: Secondary | ICD-10-CM

## 2015-04-23 DIAGNOSIS — Z0000 Encounter for general adult medical examination without abnormal findings: Secondary | ICD-10-CM

## 2015-04-23 DIAGNOSIS — I10 Essential (primary) hypertension: Secondary | ICD-10-CM

## 2015-04-23 DIAGNOSIS — Z124 Encounter for screening for malignant neoplasm of cervix: Secondary | ICD-10-CM | POA: Insufficient documentation

## 2015-04-23 DIAGNOSIS — E7800 Pure hypercholesterolemia, unspecified: Secondary | ICD-10-CM

## 2015-04-23 DIAGNOSIS — Z5181 Encounter for therapeutic drug level monitoring: Secondary | ICD-10-CM | POA: Diagnosis not present

## 2015-04-23 LAB — CBC WITH DIFFERENTIAL/PLATELET
BASOS PCT: 1 % (ref 0–1)
Basophils Absolute: 0 10*3/uL (ref 0.0–0.1)
Eosinophils Absolute: 0.2 10*3/uL (ref 0.0–0.7)
Eosinophils Relative: 5 % (ref 0–5)
HCT: 39.4 % (ref 36.0–46.0)
HEMOGLOBIN: 13.4 g/dL (ref 12.0–15.0)
Lymphocytes Relative: 26 % (ref 12–46)
Lymphs Abs: 1.1 10*3/uL (ref 0.7–4.0)
MCH: 30.5 pg (ref 26.0–34.0)
MCHC: 34 g/dL (ref 30.0–36.0)
MCV: 89.7 fL (ref 78.0–100.0)
MONO ABS: 0.5 10*3/uL (ref 0.1–1.0)
MPV: 9.5 fL (ref 8.6–12.4)
Monocytes Relative: 12 % (ref 3–12)
NEUTROS ABS: 2.5 10*3/uL (ref 1.7–7.7)
NEUTROS PCT: 56 % (ref 43–77)
PLATELETS: 382 10*3/uL (ref 150–400)
RBC: 4.39 MIL/uL (ref 3.87–5.11)
RDW: 13.9 % (ref 11.5–15.5)
WBC: 4.4 10*3/uL (ref 4.0–10.5)

## 2015-04-23 MED ORDER — IRBESARTAN 150 MG PO TABS: 150.0000 mg | ORAL_TABLET | Freq: Every day | ORAL | Status: DC

## 2015-04-23 MED ORDER — ATORVASTATIN CALCIUM 20 MG PO TABS: 20.0000 mg | ORAL_TABLET | Freq: Every day | ORAL | Status: DC

## 2015-04-23 MED ORDER — QUETIAPINE FUMARATE 25 MG PO TABS: 25.0000 mg | ORAL_TABLET | Freq: Every day | ORAL | Status: DC

## 2015-04-23 NOTE — Patient Instructions (Addendum)
HEALTH MAINTENANCE RECOMMENDATIONS:  It is recommended that you get at least 30 minutes of aerobic exercise at least 5 days/week (for weight loss, you may need as much as 60-90 minutes). This can be any activity that gets your heart rate up. This can be divided in 10-15 minute intervals if needed, but try and build up your endurance at least once a week.  Weight bearing exercise is also recommended twice weekly.  Eat a healthy diet with lots of vegetables, fruits and fiber.  "Colorful" foods have a lot of vitamins (ie green vegetables, tomatoes, red peppers, etc).  Limit sweet tea, regular sodas and alcoholic beverages, all of which has a lot of calories and sugar.  Up to 1 alcoholic drink daily may be beneficial for women (unless trying to lose weight, watch sugars).  Drink a lot of water.  Calcium recommendations are 1200-1500 mg daily (1500 mg for postmenopausal women or women without ovaries), and vitamin D 1000 IU daily.  This should be obtained from diet and/or supplements (vitamins), and calcium should not be taken all at once, but in divided doses.  Monthly self breast exams and yearly mammograms for women over the age of 2 is recommended.  Sunscreen of at least SPF 30 should be used on all sun-exposed parts of the skin when outside between the hours of 10 am and 4 pm (not just when at beach or pool, but even with exercise, golf, tennis, and yard work!)  Use a sunscreen that says "broad spectrum" so it covers both UVA and UVB rays, and make sure to reapply every 1-2 hours.  Remember to change the batteries in your smoke detectors when changing your clock times in the spring and fall.  Use your seat belt every time you are in a car, and please drive safely and not be distracted with cell phones and texting while driving.   Lisa [name] , Thank you for taking time to come for your Medicare Wellness Visit. I appreciate your ongoing commitment to your health goals. Please review the  following plan we discussed and let me know if I can assist you in the future.   These are the goals we discussed: Goals    None      This is a list of the screening recommended for you and due dates:  Health Maintenance  Topic Date Due  . Flu Shot  03/10/2015  . Colon Cancer Screening  12/22/2016  . Mammogram  02/12/2017  . Tetanus Vaccine  01/12/2022  . DEXA scan (bone density measurement)  Completed  . Shingles Vaccine  Completed  . Pneumonia vaccines  Completed   Flu shot given today I recommend yearly mammograms, so due again in 2017. Next bone density is due in 02/2017  Continue to check your blood pressure regularly.  Goal is <130/80. Limit the sodium in your diet, get daily exercise, and try and lose a few pounds. Bring your monitor and your list to your next visit in a month.  Try taking Lysine supplement.  Low-Sodium Eating Plan Sodium raises blood pressure and causes water to be held in the body. Getting less sodium from food will help lower your blood pressure, reduce any swelling, and protect your heart, liver, and kidneys. We get sodium by adding salt (sodium chloride) to food. Most of our sodium comes from canned, boxed, and frozen foods. Restaurant foods, fast foods, and pizza are also very high in sodium. Even if you take medicine to lower your blood pressure  or to reduce fluid in your body, getting less sodium from your food is important. WHAT IS MY PLAN? Most people should limit their sodium intake to 2,300 mg a day. Your health care provider recommends that you limit your sodium intake to __________ a day.  WHAT DO I NEED TO KNOW ABOUT THIS EATING PLAN? For the low-sodium eating plan, you will follow these general guidelines:  Choose foods with a % Daily Value for sodium of less than 5% (as listed on the food label).   Use salt-free seasonings or herbs instead of table salt or sea salt.   Check with your health care provider or pharmacist before using salt  substitutes.   Eat fresh foods.  Eat more vegetables and fruits.  Limit canned vegetables. If you do use them, rinse them well to decrease the sodium.   Limit cheese to 1 oz (28 g) per day.   Eat lower-sodium products, often labeled as "lower sodium" or "no salt added."  Avoid foods that contain monosodium glutamate (MSG). MSG is sometimes added to [place] food and some canned foods.  Check food labels (Nutrition Facts labels) on foods to learn how much sodium is in one serving.  Eat more home-cooked food and less restaurant, buffet, and fast food.  When eating at a restaurant, ask that your food be prepared with less salt or none, if possible.  HOW DO I READ FOOD LABELS FOR SODIUM INFORMATION? The Nutrition Facts label lists the amount of sodium in one serving of the food. If you eat more than one serving, you must multiply the listed amount of sodium by the number of servings. Food labels may also identify foods as:  Sodium free--Less than 5 mg in a serving.  Very low sodium--35 mg or less in a serving.  Low sodium--140 mg or less in a serving.  Light in sodium--50% less sodium in a serving. For example, if a food that usually has 300 mg of sodium is changed to become light in sodium, it will have 150 mg of sodium.  Reduced sodium--25% less sodium in a serving. For example, if a food that usually has 400 mg of sodium is changed to reduced sodium, it will have 300 mg of sodium. WHAT FOODS CAN I EAT? Grains Low-sodium cereals, including oats, puffed wheat and rice, and shredded wheat cereals. Low-sodium crackers. Unsalted rice and pasta. Lower-sodium bread.  Vegetables Frozen or fresh vegetables. Low-sodium or reduced-sodium canned vegetables. Low-sodium or reduced-sodium tomato sauce and paste. Low-sodium or reduced-sodium tomato and vegetable juices.  Fruits Fresh, frozen, and canned fruit. Fruit juice.  Meat and Other Protein Products Low-sodium canned tuna and  salmon. Fresh or frozen meat, poultry, seafood, and fish. Lamb. Unsalted nuts. Dried beans, peas, and lentils without added salt. Unsalted canned beans. Homemade soups without salt. Eggs.  Dairy Milk. Soy milk. Ricotta cheese. Low-sodium or reduced-sodium cheeses. Yogurt.  Condiments Fresh and dried herbs and spices. Salt-free seasonings. Onion and garlic powders. Low-sodium varieties of mustard and ketchup. Lemon juice.  Fats and Oils Reduced-sodium salad dressings. Unsalted butter.  Other Unsalted popcorn and pretzels.  The items listed above may not be a complete list of recommended foods or beverages. Contact your dietitian for more options. WHAT FOODS ARE NOT RECOMMENDED? Grains Instant hot cereals. Bread stuffing, pancake, and biscuit mixes. Croutons. Seasoned rice or pasta mixes. Noodle soup cups. Boxed or frozen macaroni and cheese. Self-rising flour. Regular salted crackers. Vegetables Regular canned vegetables. Regular canned tomato sauce and paste. Regular  tomato and vegetable juices. Frozen vegetables in sauces. Salted french fries. Olives. Rosita Fire. Relishes. Sauerkraut. Salsa. Meat and Other Protein Products Salted, canned, smoked, spiced, or pickled meats, seafood, or fish. Bacon, ham, sausage, hot dogs, corned beef, chipped beef, and packaged luncheon meats. Salt pork. Jerky. Pickled herring. Anchovies, regular canned tuna, and sardines. Salted nuts. Dairy Processed cheese and cheese spreads. Cheese curds. Blue cheese and cottage cheese. Buttermilk.  Condiments Onion and garlic salt, seasoned salt, table salt, and sea salt. Canned and packaged gravies. Worcestershire sauce. Tartar sauce. Barbecue sauce. Teriyaki sauce. Soy sauce, including reduced sodium. Steak sauce. Fish sauce. Oyster sauce. Cocktail sauce. Horseradish. Regular ketchup and mustard. Meat flavorings and tenderizers. Bouillon cubes. Hot sauce. Tabasco sauce. Marinades. Taco seasonings. Relishes. Fats and  Oils Regular salad dressings. Salted butter. Margarine. Ghee. Bacon fat.  Other Potato and tortilla chips. Corn chips and puffs. Salted popcorn and pretzels. Canned or dried soups. Pizza. Frozen entrees and pot pies.  The items listed above may not be a complete list of foods and beverages to avoid. Contact your dietitian for more information. Document Released: 01/15/2002 Document Revised: 07/31/2013 Document Reviewed: 05/30/2013 [facility] Patient Information 2015 [place], [place]. This information is not intended to replace advice given to you by your health care provider. Make sure you discuss any questions you have with your health care provider.

## 2015-04-23 NOTE — Progress Notes (Signed)
Chief Complaint  Patient presents with  . Med check plus    fasting med check plus with pelvic. No concerns. Would like to ask about allergy testing for mouth sores.     Lisa [name] is a 74 y.o. female who presents for annual wellness visit and follow-up on chronic medical conditions.  She has the following concerns:  Gets occasional sores on her tongue and throat, coming and going for about 7 years.  When she has them, she is more aware of the discomfort with certain foods, but doesn't avoid the fruits that bother it.  She has steroids from dentist to use prn.  Previously took Lysine, but forgot about it.  Not getting worse or more severe.  Carotid artery disease in her family--she has previously had [facility] screening that was normal. She reports taking better care of herself than her twin.  We discussed this at her last visit, and she elected to hold off on carotid artery.  She still is not interested in carotid ultrasound at this time.  Hypertension follow-up: Her avapro dose was increased from 75 to 166m In September 2015 after noting her BP's to be running high. She remains under a lot of stress related to the cognitive impairment of her husband, which isn't improving. She brings in a list of her BP's--in the last 3 months, ranging from 127/73 (just once) up to 1220systolic, over 725-42diastolic.  In the last 3 months, BP's have been mostly 140's/80's. 6 months ago, she had more frequent lower numbers (in the 120's to lower 130's, that don't seem to be occuring now. No headaches, dizziness, chest pain, palpitations. Denies edema; denies side effects.  Hyperlipidemia follow-up: Patient is reportedly following a low-fat, low cholesterol diet. Compliant with medications and denies medication side effects.   Hypothyroidism: Thyroid biopsy in 2011 was benign; had seen Dr. BChalmers Caterin the past, but we are now managing her medications. Denies fatigue, hair, skin, bowel changes. Denies any mood  changes. Denies dysphagia or any thyroid concerns.  Husband had concussion, got cognitive therapy, but still having ongoing issues, which don't seem to be improving.She is aware of some support groups, but hasn't reached out yet.  Insomnia: Well controlled with Seroquel; has been on it for over 5 years.   Osteopenia:   Takes Calcium with D BID, gets regular exercise. She has been getting weight-bearing exercise through [name] Last DEXA 02/2015 with slight decline. T-1.7, but now the FRAX score at hip is >3%. She reports previously taking Actonel for 10 years, stopped it on her own.  Doesn't want to restart meds.  Immunization History  Administered Date(s) Administered  . Influenza Whole 05/02/2010  . Influenza, High Dose Seasonal PF 05/16/2014  . Pneumococcal Conjugate-13 04/17/2014  . Pneumococcal Polysaccharide-23 09/21/2005  . Td 01/02/2002  . Tdap 01/13/2012  . Zoster 02/26/2011   Last Pap smear: 12/2010 (pt declined last year) Last mammo 02/2015 Colonoscopy 5/08  Dexa 02/2015 Dentist and ophtho regularly  Exercise:  She goes to [name] and she loves it, and helps her deal with stress. Also does chair yoga once weekly. Not walking as much due to the heat.  Also doing another program on Saturdays with her husband.  Other doctors caring for patient include: Dentist: Dr. KNoah [name]: Dr. SGershon CraneCardiologist: Dr. KGeorgina [name](no longer sees) Endocrinologist: Dr. BChalmers [name](no longer sees)  Depression, fall and functional status screens were all negative--see epic questionnaires.  End of Life Discussion:  Patient has a living will and medical power of attorney. She is interested in changing them, due to the change in cognitive status of her husband.  Past Medical History  Diagnosis Date  . Allergy   . Leukopenia   . Osteopenia   . OA (osteoarthritis)     hips, knees and LS  . Psoriasis   . Hypertension   . Hypercholesteremia   .  Insomnia   . Colon polyps     hyperplastic  . Vitamin D deficiency   . Multinodular thyroid     Past Surgical History  Procedure Laterality Date  . Flexible sigmoidoscopy  05-2000  . Cardiolyte  02-2002  . Colonoscopy  12-2006  . Tonsillectomy and adenoidectomy    . Knee surgery  1969    Left, cartilage  . Hand tendon surgery  1979    L 4th finger    Social History   Social History  . Marital Status: Married    Spouse Name: N/A  . Number of Children: 2  . 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 Smoking status: Never Smoker   . Smokeless tobacco: Never Used  . Alcohol Use: 0.0 oz/week    0 Standard drinks or equivalent per week     Comment: socially, 2 drinks twice weekly  . Drug Use: No  . Sexual Activity:    Partners: Male   Other Topics Concern  . Not on file   Social History Narrative   Lives with her husband, who has cognitive deficits.  Son is in [place] (with 2 grandchildren there), daughter and 1 grandson are local.      Family History  Problem Relation Age of Onset  . Allergies Mother   . Hypertension Mother   . Vision loss Mother   . Heart disease Mother   . Arthritis Mother   . Arthritis Father   . Heart disease Father 46  . Hearing loss Sister   . Hypertension Sister   . Hyperlipidemia Sister   . Arthritis Sister   . Heart disease Sister 86    massive MI  . Stroke Maternal Grandmother   . Heart disease Maternal Grandfather   . Stroke Maternal Grandfather   . Heart disease Paternal Grandmother   . Stroke Paternal Grandmother   . Cancer Neg Hx   . Arthritis Sister   . Heart disease Sister   . Hyperlipidemia Sister   . Hypertension Sister   . Vision loss Sister     macular degeneration  . Arthritis Sister   . Hypertension Sister   . Hyperlipidemia Sister   . Thyroid disease Sister   . Diabetes Sister 16    diet controlled    Outpatient  Encounter Prescriptions as of 04/23/2015  Medication Sig Note  . aspirin 81 MG tablet Take 81 mg by mouth daily.     Marland Kitchen atorvastatin (LIPITOR) 20 MG tablet Take 1 tablet (20 mg total) by mouth daily.   . Calcium Carbonate-Vitamin D (CALTRATE 600+D PO) Take 1 tablet by mouth 2 (two) times daily.     . fluocinonide (LIDEX) 0.05 % gel Apply 1 application topically 4 (four) times daily. Apply to affected area four times daily until resolved  01/06/2011: Prescribed by Dentist for [employer]  . irbesartan (AVAPRO) 150 MG tablet Take 1 tablet (150 mg total) by mouth at bedtime.   . Multiple Vitamins-Minerals (CENTRUM SILVER  PO) Take 1 tablet by mouth daily.     . QUEtiapine (SEROQUEL) 25 MG tablet Take 1 tablet (25 mg total) by mouth at bedtime.   Marland Kitchen SYNTHROID 50 MCG tablet Take 1 tablet (50 mcg total) by mouth daily before breakfast.   . [DISCONTINUED] atorvastatin (LIPITOR) 20 MG tablet Take 1 tablet (20 mg total) by mouth daily.   . [DISCONTINUED] irbesartan (AVAPRO) 150 MG tablet Take 1 tablet (150 mg total) by mouth at bedtime.   . [DISCONTINUED] QUEtiapine (SEROQUEL) 25 MG tablet Take 1 tablet (25 mg total) by mouth at bedtime.   . [DISCONTINUED] irbesartan (AVAPRO) 150 MG tablet TAKE 1 TABLET AT BEDTIME    No facility-administered encounter medications on file as of 04/23/2015.    Allergies  Allergen Reactions  . Amoxicillin Rash  . Erythromycin Rash  . Sulfa Antibiotics Rash   ROS: The patient denies anorexia, fever, weight changes, headaches, vision changes, ear pain, sore throat, breast concerns, chest pain, palpitations, dizziness, syncope, dyspnea on exertion, cough, swelling, nausea, vomiting, diarrhea, constipation, abdominal pain, melena, hematochezia, indigestion/heartburn, hematuria, incontinence, dysuria, vaginal bleeding, discharge, odor or itch, genital lesions, numbness, tingling, weakness, tremor, suspicious skin lesions, depression, anxiety, abnormal bleeding/bruising, or  enlarged lymph nodes. Mouth sores as per HPI. Every since a bad spell of sores in her mouth about 5 years ago, she has been more sensitive.  PHYSICAL EXAM:  BP 144/88 mmHg  Pulse 72  Ht 5' 2"  (1.575 m)  Wt 143 lb 9.6 oz (65.137 kg)  BMI 26.26 kg/m2 142/76 on repeat by MD  General Appearance:  Alert, cooperative, no distress, appears stated age   Head:  Normocephalic, without obvious abnormality, atraumatic   Eyes:  PERRL, conjunctiva/corneas clear, EOM's intact, fundi  benign   Ears:  Normal TM's and external ear canals   Nose:  Nares normal, mucosa normal, no drainage or sinus tenderness   Throat:  Lips and tongue normal; teeth normal; small aphthous ulcer at buccal mucosa on left upper cheek, at level of teeth  Neck:  Supple, no lymphadenopathy; thyroid: no enlargement/tenderness/nodules; no carotid  bruit or JVD   Back:  Spine nontender, no curvature, ROM normal, no CVA tenderness   Lungs:  Clear to auscultation bilaterally without wheezes, rales or ronchi; respirations unlabored   Chest Wall:  No tenderness or deformity   Heart:  Regular rate and rhythm, S1 and S2 normal, no murmur, rub  or gallop   Breast Exam:  No tenderness, masses, or nipple discharge or inversion. No axillary lymphadenopathy   Abdomen:  Soft, non-tender, nondistended, normoactive bowel sounds,  no masses, no hepatosplenomegaly   Genitalia:  Normal external genitalia without lesions. Mild atrophic changes. BUS and vagina normal; no cervical motion tenderness. No abnormal vaginal discharge. Uterus and adnexa not enlarged, nontender, no masses. Polyp noted at left side of cervical os, no other lesions noted.  Pap performed   Rectal:  Normal tone, no masses or tenderness; guaiac negative stool   Extremities:  No clubbing, cyanosis or edema.   Pulses:  2+ and symmetric all extremities   Skin:  Skin color, texture, turgor normal, no rashes or lesions   Lymph nodes:   Cervical, supraclavicular, and axillary nodes normal   Neurologic:  CNII-XII intact, normal strength, sensation and gait; reflexes 2+ and symmetric throughout   Psych: Normal mood, affect, hygiene and grooming.         ASSESSMENT/PLAN:  Medicare annual wellness visit, subsequent  Pure hypercholesterolemia - Plan: Lipid panel, Comprehensive  metabolic panel, atorvastatin (LIPITOR) 20 MG tablet  Essential hypertension, benign - not at goal, possibly related to stress. low sodium diet, daily exercise, weight loss. If remains high, add HCTZ 12.5 - Plan: Comprehensive metabolic panel, irbesartan (AVAPRO) 150 MG tablet  Osteopenia - with abnormal FRAX at hip. Declines restarting meds. Weight-bearing exercise, Ca and D. Recheck DEXA 02/2017 - Plan: Vit D  25 hydroxy (rtn osteoporosis monitoring)  Insomnia - controlled - Plan: QUEtiapine (SEROQUEL) 25 MG tablet  Hypothyroidism, unspecified hypothyroidism type - Plan: TSH  Insomnia - well controlled - Plan: QUEtiapine (SEROQUEL) 25 MG tablet  Encounter for routine gynecological examination - Plan: Cytology - PAP [place]  Medication monitoring encounter - Plan: Lipid panel, Comprehensive metabolic panel, CBC with Differential/Platelet, Vit D  25 hydroxy (rtn osteoporosis monitoring), TSH  Vitamin D deficiency - Plan: Vit D  25 hydroxy (rtn osteoporosis monitoring)  Need for prophylactic vaccination and inoculation against influenza - Plan: Flu vaccine HIGH DOSE PF (Fluzone High dose)  HTN--not at goal.  declines starting HCTZ Continue to check your blood pressure regularly.  Goal is <130/80. Limit the sodium in your diet, get daily exercise, and try and lose a few pounds. Bring your monitor and your list to your next visit in a month.  Osteopenia. H/o 10 years of Actonel.  Prefers not to start new medications, would rather recheck in 2 years. Ca, Vit D, weight-bearing exercise reviewed.  Caregiver stress;  Encouraged support groups. Counseled re: depression, need to take care of self.  Discussed monthly self breast exams and yearly mammograms; at least 30 minutes of aerobic activity at least 5 days/week and weight-bearing exercise 2x/week; proper sunscreen use reviewed; healthy diet, including goals of calcium and vitamin D intake and alcohol recommendations (less than or equal to 1 drink/day) reviewed; regular seatbelt use; changing batteries in smoke detectors.  Immunization recommendations discussed--high dose flu shot today.  Colonoscopy recommendations reviewed, UTD.  Pt is full code, full care. She was given paperwork to re-do her Living Will and healthcare power of attorney; asked to give [place] copies after getting it notarized.  c-met, lipid, tsh, cbc, vit D (last done in 2013, normal). Pap performed.  F/u 1 month on BP; 6 mos for med check   Medicare Attestation I have personally reviewed: The patient's medical and social history Their use of alcohol, tobacco or illicit drugs Their current medications and supplements The patient's functional ability including ADLs,fall risks, home safety risks, cognitive, and hearing and visual impairment Diet and physical activities Evidence for depression or mood disorders  The patient's weight, height, BMI, and visual acuity have been recorded in the chart.  I have made referrals, counseling, and provided education to the patient based on review of the above and I have provided the patient with a written personalized care plan for preventive services.     Ani Deoliveira A, MD   04/23/2015

## 2015-04-24 ENCOUNTER — Encounter: Payer: Self-pay | Admitting: Family Medicine

## 2015-04-24 LAB — COMPREHENSIVE METABOLIC PANEL
ALK PHOS: 59 U/L (ref 33–130)
ALT: 21 U/L (ref 6–29)
AST: 21 U/L (ref 10–35)
Albumin: 4.7 g/dL (ref 3.6–5.1)
BUN: 14 mg/dL (ref 7–25)
CALCIUM: 10.7 mg/dL — AB (ref 8.6–10.4)
CO2: 25 mmol/L (ref 20–31)
Chloride: 102 mmol/L (ref 98–110)
Creat: 0.61 mg/dL (ref 0.60–0.93)
Glucose, Bld: 89 mg/dL (ref 65–99)
POTASSIUM: 4.4 mmol/L (ref 3.5–5.3)
Sodium: 138 mmol/L (ref 135–146)
TOTAL PROTEIN: 7 g/dL (ref 6.1–8.1)
Total Bilirubin: 0.5 mg/dL (ref 0.2–1.2)

## 2015-04-24 LAB — LIPID PANEL
CHOLESTEROL: 155 mg/dL (ref 125–200)
HDL: 60 mg/dL (ref >=46)
LDL Cholesterol: 79 mg/dL (ref <130)
TRIGLYCERIDES: 78 mg/dL (ref <150)
Total CHOL/HDL Ratio: 2.6 Ratio (ref <=5.0)
VLDL: 16 mg/dL (ref <30)

## 2015-04-24 LAB — TSH: TSH: 1.389 u[IU]/mL (ref 0.350–4.500)

## 2015-04-24 LAB — VITAMIN D 25 HYDROXY (VIT D DEFICIENCY, FRACTURES): Vit D, 25-Hydroxy: 40 ng/mL (ref 30–100)

## 2015-04-24 MED ORDER — SYNTHROID 50 MCG PO TABS: 50.0000 ug | ORAL_TABLET | Freq: Every day | ORAL | Status: DC

## 2015-04-24 NOTE — Addendum Note (Signed)
Addended by: Joselyn Arrow on: 04/24/2015 09:14 AM   Modules accepted: Orders

## 2015-04-25 LAB — CYTOLOGY - PAP

## 2015-05-29 ENCOUNTER — Encounter: Payer: Self-pay | Admitting: Family Medicine

## 2015-05-29 ENCOUNTER — Ambulatory Visit (INDEPENDENT_AMBULATORY_CARE_PROVIDER_SITE_OTHER): Payer: Medicare Other | Admitting: Family Medicine

## 2015-05-29 VITALS — BP 162/100 | HR 64 | Ht 62.0 in | Wt 141.6 lb

## 2015-05-29 DIAGNOSIS — M545 Low back pain: Secondary | ICD-10-CM | POA: Diagnosis not present

## 2015-05-29 DIAGNOSIS — M9901 Segmental and somatic dysfunction of cervical region: Secondary | ICD-10-CM | POA: Diagnosis not present

## 2015-05-29 DIAGNOSIS — M9902 Segmental and somatic dysfunction of thoracic region: Secondary | ICD-10-CM | POA: Diagnosis not present

## 2015-05-29 DIAGNOSIS — F411 Generalized anxiety disorder: Secondary | ICD-10-CM | POA: Diagnosis not present

## 2015-05-29 DIAGNOSIS — I10 Essential (primary) hypertension: Secondary | ICD-10-CM

## 2015-05-29 DIAGNOSIS — M5432 Sciatica, left side: Secondary | ICD-10-CM | POA: Diagnosis not present

## 2015-05-29 MED ORDER — ESCITALOPRAM OXALATE 10 MG PO TABS: ORAL_TABLET | ORAL | Status: DC

## 2015-05-29 MED ORDER — ALPRAZOLAM 0.25 MG PO TABS: 0.2500 mg | ORAL_TABLET | Freq: Three times a day (TID) | ORAL | Status: DC | PRN

## 2015-05-29 NOTE — Patient Instructions (Signed)
  lexapro 10mg  Start at 1/2 tablet once daily--you can take it in the morning (change to evening only if it makes you sleepy, which usually only occurs in some people, at the higher doses). After 1-2 weeks, if you aren't noticing any benefit, but don't have any significant side effects, please increase it to the full tablet daily (and change to nighttime only if it makes you sleepy). Return here in 4-6 weeks with your blood pressure list. Sometimes check your blood pressure when you are feeling anxious/tired/overwhelmed, and make that comment on the sheet.  Use alprazolam sparingly, only if needed for severe anxiety.  This may make you a little sleepy, so use with caution during the day and with driving.  I'm giving you the lowest dose, you can feel free to start at just 1/2, but if that isn't effective, it is fine to use the full pill.  Please call [facility] Berniece Andreas(Julie Whitt) for counseling.  I think you would benefit from support groups, and she might be able to point you in the right direction. Consider checking into respite care/caregivers if needed for you to have more freedom to leave the house.

## 2015-05-29 NOTE — Progress Notes (Signed)
Chief Complaint  Patient presents with  . Follow-up    1 month of blood pressure.    BP's from 9/16 through 10/6 were running 128-150/68-86, averaging about 140/80. Checked when she felt relaxed and calm.  She had a rough week--traveling from [place] this past weekend, with flight cancelations; while on line, husband wandered away.  She has been exhausted after such panic. Her daughter said "mom, you don't need more blood pressure medicine, you need xanax!"  She signed up for a meditation class, feels like it helps some, but isn't sure it is right for her. She has some pain in her left hip and lower back. She saw a chiropractor (recommended by Sung Amabile) yesterday for initial visit, sees him again today for first treatment.  Also has some neck pain. She was anxious at the chiropractor yesterday, and recalls that BP was high, siimlar to today's BP here.  Overwhelmed, exhausted.  "not my style" to feel down.  Feels frustrated, and "weary".  Past Medical History  Diagnosis Date  . Allergy   . Leukopenia   . Osteopenia   . OA (osteoarthritis)     hips, knees and LS  . Psoriasis   . Hypertension   . Hypercholesteremia   . Insomnia   . Colon polyps     hyperplastic  . Vitamin D deficiency   . Multinodular thyroid    Past Surgical History  Procedure Laterality Date  . Flexible sigmoidoscopy  05-2000  . Cardiolyte  02-2002  . Colonoscopy  12-2006  . Tonsillectomy and adenoidectomy    . Knee surgery  1969    Left, cartilage  . Hand tendon surgery  1979    L 4th finger   Social History   Social History  . Marital Status: Married    Spouse Name: N/A  . Number of Children: 2  . 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 Smoking status: Never Smoker   . Smokeless tobacco: Never Used  . Alcohol Use: 0.0 oz/week    0 Standard drinks or equivalent per week     Comment: socially, 2 drinks  twice weekly  . Drug Use: No  . Sexual Activity:    Partners: Male   Other Topics Concern  . Not on file   Social History Narrative   Lives with her husband, who has cognitive deficits.  Son is in [place] (with 2 grandchildren there), daughter and 1 grandson are local.     Outpatient Encounter Prescriptions as of 05/29/2015  Medication Sig Note  . aspirin 81 MG tablet Take 81 mg by mouth daily.     Marland Kitchen atorvastatin (LIPITOR) 20 MG tablet Take 1 tablet (20 mg total) by mouth daily.   . Calcium Carbonate-Vitamin D (CALTRATE 600+D PO) Take 1 tablet by mouth 2 (two) times daily.     . fluocinonide (LIDEX) 0.05 % gel Apply 1 application topically 4 (four) times daily. Apply to affected area four times daily until resolved  01/06/2011: Prescribed by Dentist for [employer]  . irbesartan (AVAPRO) 150 MG tablet Take 1 tablet (150 mg total) by mouth at bedtime.   . Multiple Vitamins-Minerals (CENTRUM SILVER PO) Take 1 tablet by mouth daily.     . QUEtiapine (SEROQUEL) 25 MG tablet Take 1 tablet (25 mg total) by mouth at bedtime.   Marland Kitchen SYNTHROID 50 MCG tablet Take 1 tablet (50 mcg total) by mouth  daily before breakfast.   . ALPRAZolam (XANAX) 0.25 MG tablet Take 1 tablet (0.25 mg total) by mouth 3 (three) times daily as needed for anxiety.   Marland Kitchen. escitalopram (LEXAPRO) 10 MG tablet Take 1/2 tablet by mouth once daily in the morning.  Increase to full tablet after 1-2 weeks    No facility-administered encounter medications on file as of 05/29/2015.   (xanax and lexapro rx'd today, not prior to visit).  Allergies  Allergen Reactions  . Amoxicillin Rash  . Erythromycin Rash  . Sulfa Antibiotics Rash   ROS:  No headaches, dizziness, chest pain, shortness of breath, nausea, vomiting, bowel changes, bleeding, bruising, rash.  +anxiety/worry as per HPI  PHYSICAL EXAM: BP 162/100 mmHg  Pulse 64  Ht 5\' 2"  (1.575 m)  Wt 141 lb 9.6 oz (64.229 kg)  BMI 25.89 kg/m2 Patient's monitor read  158/93  Excitable, talkative elderly female in no distress HEENT: PERRL, EOMI, conjunctiva clear. OP clear Neck: no lymphadenopathy, thyromegaly or mass Heart: regular rate and rhythm Lungs: clear bilaterally Extremities: no edema Skin: no rash Neuro: alert and oriented, cranial nerves intact, normal gait. Balance  ASSESSMENT/PLAN:  Generalized anxiety disorder - risks/benefits/SE of xanax and lexapro reiewed in detail. Counseling encouraged - Plan: escitalopram (LEXAPRO) 10 MG tablet, ALPRAZolam (XANAX) 0.25 MG tablet  Essential hypertension - high today; borderline high at home. monitor accurate.    lexapro 10mg  Start at 1/2 tablet once daily--you can take it in the morning (change to evening only if it makes you sleepy, which usually only occurs in some people, at the higher doses). After 1-2 weeks, if you aren't noticing any benefit, but don't have any significant side effects, please increase it to the full tablet daily (and change to nighttime only if it makes you sleepy). Return here in 4-6 weeks with your blood pressure list. Sometimes check your blood pressure when you are feeling anxious/tired/overwhelmed, and make that comment on the sheet.  Use alprazolam sparingly, only if needed for severe anxiety.  This may make you a little sleepy, so use with caution during the day and with driving.  I'm giving you the lowest dose, you can feel free to start at just 1/2, but if that isn't effective, it is fine to use the full pill.  Please call [facility] Berniece Andreas(Julie Whitt) for counseling.  I think you would benefit from support groups, and she might be able to point you in the right direction. Consider checking into respite care/caregivers if needed for you to have more freedom to leave the house.    Continue current medications.  If remains elevated (despite treating anxiety), then adjust meds at her f/u  25 min visit, more than 1/2 spent counseling.

## 2015-06-02 DIAGNOSIS — M545 Low back pain: Secondary | ICD-10-CM | POA: Diagnosis not present

## 2015-06-02 DIAGNOSIS — M5432 Sciatica, left side: Secondary | ICD-10-CM | POA: Diagnosis not present

## 2015-06-02 DIAGNOSIS — M9902 Segmental and somatic dysfunction of thoracic region: Secondary | ICD-10-CM | POA: Diagnosis not present

## 2015-06-02 DIAGNOSIS — M9901 Segmental and somatic dysfunction of cervical region: Secondary | ICD-10-CM | POA: Diagnosis not present

## 2015-06-04 DIAGNOSIS — M5432 Sciatica, left side: Secondary | ICD-10-CM | POA: Diagnosis not present

## 2015-06-04 DIAGNOSIS — M9902 Segmental and somatic dysfunction of thoracic region: Secondary | ICD-10-CM | POA: Diagnosis not present

## 2015-06-04 DIAGNOSIS — M545 Low back pain: Secondary | ICD-10-CM | POA: Diagnosis not present

## 2015-06-04 DIAGNOSIS — M9901 Segmental and somatic dysfunction of cervical region: Secondary | ICD-10-CM | POA: Diagnosis not present

## 2015-06-09 DIAGNOSIS — M545 Low back pain: Secondary | ICD-10-CM | POA: Diagnosis not present

## 2015-06-09 DIAGNOSIS — M9901 Segmental and somatic dysfunction of cervical region: Secondary | ICD-10-CM | POA: Diagnosis not present

## 2015-06-09 DIAGNOSIS — M9902 Segmental and somatic dysfunction of thoracic region: Secondary | ICD-10-CM | POA: Diagnosis not present

## 2015-06-09 DIAGNOSIS — M5432 Sciatica, left side: Secondary | ICD-10-CM | POA: Diagnosis not present

## 2015-06-12 DIAGNOSIS — M9901 Segmental and somatic dysfunction of cervical region: Secondary | ICD-10-CM | POA: Diagnosis not present

## 2015-06-12 DIAGNOSIS — M545 Low back pain: Secondary | ICD-10-CM | POA: Diagnosis not present

## 2015-06-12 DIAGNOSIS — M9902 Segmental and somatic dysfunction of thoracic region: Secondary | ICD-10-CM | POA: Diagnosis not present

## 2015-06-12 DIAGNOSIS — M5432 Sciatica, left side: Secondary | ICD-10-CM | POA: Diagnosis not present

## 2015-06-23 DIAGNOSIS — M9901 Segmental and somatic dysfunction of cervical region: Secondary | ICD-10-CM | POA: Diagnosis not present

## 2015-06-23 DIAGNOSIS — M545 Low back pain: Secondary | ICD-10-CM | POA: Diagnosis not present

## 2015-06-23 DIAGNOSIS — M5432 Sciatica, left side: Secondary | ICD-10-CM | POA: Diagnosis not present

## 2015-06-23 DIAGNOSIS — M9902 Segmental and somatic dysfunction of thoracic region: Secondary | ICD-10-CM | POA: Diagnosis not present

## 2015-06-26 DIAGNOSIS — M9901 Segmental and somatic dysfunction of cervical region: Secondary | ICD-10-CM | POA: Diagnosis not present

## 2015-06-26 DIAGNOSIS — M5432 Sciatica, left side: Secondary | ICD-10-CM | POA: Diagnosis not present

## 2015-06-26 DIAGNOSIS — M9902 Segmental and somatic dysfunction of thoracic region: Secondary | ICD-10-CM | POA: Diagnosis not present

## 2015-06-26 DIAGNOSIS — M545 Low back pain: Secondary | ICD-10-CM | POA: Diagnosis not present

## 2015-06-30 DIAGNOSIS — M5432 Sciatica, left side: Secondary | ICD-10-CM | POA: Diagnosis not present

## 2015-06-30 DIAGNOSIS — M545 Low back pain: Secondary | ICD-10-CM | POA: Diagnosis not present

## 2015-06-30 DIAGNOSIS — M9902 Segmental and somatic dysfunction of thoracic region: Secondary | ICD-10-CM | POA: Diagnosis not present

## 2015-06-30 DIAGNOSIS — M9901 Segmental and somatic dysfunction of cervical region: Secondary | ICD-10-CM | POA: Diagnosis not present

## 2015-07-07 DIAGNOSIS — M545 Low back pain: Secondary | ICD-10-CM | POA: Diagnosis not present

## 2015-07-07 DIAGNOSIS — M9902 Segmental and somatic dysfunction of thoracic region: Secondary | ICD-10-CM | POA: Diagnosis not present

## 2015-07-07 DIAGNOSIS — M9901 Segmental and somatic dysfunction of cervical region: Secondary | ICD-10-CM | POA: Diagnosis not present

## 2015-07-07 DIAGNOSIS — M5432 Sciatica, left side: Secondary | ICD-10-CM | POA: Diagnosis not present

## 2015-07-10 ENCOUNTER — Encounter: Payer: Self-pay | Admitting: Family Medicine

## 2015-07-10 ENCOUNTER — Ambulatory Visit (INDEPENDENT_AMBULATORY_CARE_PROVIDER_SITE_OTHER): Payer: Medicare Other | Admitting: Family Medicine

## 2015-07-10 VITALS — BP 128/72 | HR 68 | Ht 62.0 in | Wt 142.6 lb

## 2015-07-10 DIAGNOSIS — I10 Essential (primary) hypertension: Secondary | ICD-10-CM | POA: Diagnosis not present

## 2015-07-10 DIAGNOSIS — F411 Generalized anxiety disorder: Secondary | ICD-10-CM | POA: Diagnosis not present

## 2015-07-10 NOTE — Progress Notes (Signed)
Chief Complaint  Patient presents with  . Follow-up    6 week follow up on moods. Still taking 1/2 tab of Lexapro-forgot to go up to a whole.    She is taking 1/2 tablet of lexapro. Had some mild nausea off/on for the first week.  This has resolved.  She completely forgot to increase the dose.  She never took any of the alprazolam. Feeling less overwhelmed.  She had a lot of fun at a beach trip in November with old friends--friends that know her husband very well, and the environment was supportive and fun.  She hasn't had stressful situations arise like prior to her last visit (trip to [place]).  Ree Kida continues to decline.  PET scan scheduled for next week, and then will be discussing medications. She has concerns about meds, side effects, which she plans to address at neuro visit scheduled after the PET.  Despite his changes/problems, she reports he has stayed his sweet, gentle self, not combative.  Patient feels safe. She went to a luncheon on dementia.  She is aware that there is a lot of support--hasn't reached out for it yet.  Hasn't contacted counselor yet (given Raynelle Fanning Whitt's info at last visit).  BP's since last visit range from 132-160/74-101. Yesterday was 132/77.  Denies any headaches. Only one day felt somewhat dizzy.  PMH, PSH, SH reviewed.  Outpatient Encounter Prescriptions as of 07/10/2015  Medication Sig Note  . aspirin 81 MG tablet Take 81 mg by mouth daily.     Marland Kitchen atorvastatin (LIPITOR) 20 MG tablet Take 1 tablet (20 mg total) by mouth daily.   . Calcium Carbonate-Vitamin D (CALTRATE 600+D PO) Take 1 tablet by mouth 2 (two) times daily.     Marland Kitchen escitalopram (LEXAPRO) 10 MG tablet Take 1/2 tablet by mouth once daily in the morning.  Increase to full tablet after 1-2 weeks 07/10/2015: Still taking just 1/2 tablet daily  . irbesartan (AVAPRO) 150 MG tablet Take 1 tablet (150 mg total) by mouth at bedtime.   . Multiple Vitamins-Minerals (CENTRUM SILVER PO) Take 1 tablet by mouth  daily.     . QUEtiapine (SEROQUEL) 25 MG tablet Take 1 tablet (25 mg total) by mouth at bedtime.   Marland Kitchen SYNTHROID 50 MCG tablet Take 1 tablet (50 mcg total) by mouth daily before breakfast.   . ALPRAZolam (XANAX) 0.25 MG tablet Take 1 tablet (0.25 mg total) by mouth 3 (three) times daily as needed for anxiety. (Patient not taking: Reported on 07/10/2015) 07/10/2015: Has never taken this  . fluocinonide (LIDEX) 0.05 % gel Apply 1 application topically 4 (four) times daily. Apply to affected area four times daily until resolved  01/06/2011: Prescribed by Dentist for Canker Sores   No facility-administered encounter medications on file as of 07/10/2015.   Allergies  Allergen Reactions  . Amoxicillin Rash  . Erythromycin Rash  . Sulfa Antibiotics Rash   ROS:  No fever, chills, URI symptoms, chest pain, palpitations, shortness of breath. Nausea resolved. No other GI complaints or other concerns.  PHYSICAL EXAM: BP 128/72 mmHg  Pulse 68  Ht  (1.575 m)  Wt 142 lb 9.6 oz (64.683 kg)  BMI 26.08 kg/m2  154/70 on repeat by MD--after talking, somewhat excitedly about her trip, and concerns about her husband She appears to be much calmer and happier, less overwhelmed than at last visit.  She is talkative, smiling, laughing, full range of affect.  Normal eye contact, speech, hygiene and grooming  ASSESSMENT/PLAN:  Generalized  anxiety disorder - improved; ongoing stressors. Encouraged counseling/support groups. Increase to full tablet of lexapro  Essential hypertension, benign - overall controlled, elevated when excited/anxious. Continue to montor and continue 150mg  dose for now.   Increase the lexapro to the full tablet.  You likely will have some mild stomach side effects like when you first started the half tablet, but it also should resolve within a week or so.  If after 2 weeks at the full tablet you are still having any significant side effects, you can decrease back to the half  tablet. Continue on the full tablet, and follow up in 4-6 weeks if you feel like your moods are not improving or worsening.  At this point, I'm going to say to follow up in 6 months as scheduled, sooner only if needed.  You have a month's refill at the pharmacy to pick up. If you are doing well on the full tablet, we will authorize the pharmacy for more refills when they contact us.  F/u as scheduled in march

## 2015-07-10 NOTE — Patient Instructions (Signed)
  Increase the lexapro to the full tablet.  You likely will have some mild stomach side effects like when you first started the half tablet, but it also should resolve within a week or so.  If after 2 weeks at the full tablet you are still having any significant side effects, you can decrease back to the half tablet. Continue on the full tablet, and follow up in 4-6 weeks if you feel like your moods are not improving or worsening.  At this point, I'm going to say to follow up in 6 months as scheduled, sooner only if needed.  You have a month's refill at the pharmacy to pick up. If you are doing well on the full tablet, we will authorize the pharmacy for more refills when they contact us.

## 2015-07-21 DIAGNOSIS — M9902 Segmental and somatic dysfunction of thoracic region: Secondary | ICD-10-CM | POA: Diagnosis not present

## 2015-07-21 DIAGNOSIS — M9901 Segmental and somatic dysfunction of cervical region: Secondary | ICD-10-CM | POA: Diagnosis not present

## 2015-07-21 DIAGNOSIS — M5432 Sciatica, left side: Secondary | ICD-10-CM | POA: Diagnosis not present

## 2015-07-21 DIAGNOSIS — M545 Low back pain: Secondary | ICD-10-CM | POA: Diagnosis not present

## 2015-07-28 DIAGNOSIS — M545 Low back pain: Secondary | ICD-10-CM | POA: Diagnosis not present

## 2015-07-28 DIAGNOSIS — M9901 Segmental and somatic dysfunction of cervical region: Secondary | ICD-10-CM | POA: Diagnosis not present

## 2015-07-28 DIAGNOSIS — M5432 Sciatica, left side: Secondary | ICD-10-CM | POA: Diagnosis not present

## 2015-07-28 DIAGNOSIS — M9902 Segmental and somatic dysfunction of thoracic region: Secondary | ICD-10-CM | POA: Diagnosis not present

## 2015-08-06 ENCOUNTER — Other Ambulatory Visit: Payer: Self-pay | Admitting: Family Medicine

## 2015-09-23 ENCOUNTER — Telehealth: Payer: Self-pay | Admitting: Family Medicine

## 2015-09-23 DIAGNOSIS — I10 Essential (primary) hypertension: Secondary | ICD-10-CM

## 2015-09-23 DIAGNOSIS — E039 Hypothyroidism, unspecified: Secondary | ICD-10-CM

## 2015-09-23 DIAGNOSIS — G4700 Insomnia, unspecified: Secondary | ICD-10-CM

## 2015-09-23 NOTE — Telephone Encounter (Signed)
Rcvd New Prescription Request to send Atorvastatin and Escitalopram scripts to NEW PHARMACY at [facility]

## 2015-09-23 NOTE — Telephone Encounter (Signed)
[facility] is requesting refills on Synthroid, Quetipaine and Irbesartan.

## 2015-09-24 ENCOUNTER — Other Ambulatory Visit: Payer: Self-pay | Admitting: *Deleted

## 2015-09-24 DIAGNOSIS — E7800 Pure hypercholesterolemia, unspecified: Secondary | ICD-10-CM

## 2015-09-24 MED ORDER — SYNTHROID 50 MCG PO TABS: 50.0000 ug | ORAL_TABLET | Freq: Every day | ORAL | Status: DC

## 2015-09-24 MED ORDER — IRBESARTAN 150 MG PO TABS: 150.0000 mg | ORAL_TABLET | Freq: Every day | ORAL | Status: DC

## 2015-09-24 MED ORDER — QUETIAPINE FUMARATE 25 MG PO TABS: 25.0000 mg | ORAL_TABLET | Freq: Every day | ORAL | Status: DC

## 2015-09-24 MED ORDER — ESCITALOPRAM OXALATE 10 MG PO TABS: ORAL_TABLET | ORAL | Status: DC

## 2015-09-24 MED ORDER — ATORVASTATIN CALCIUM 20 MG PO TABS: 20.0000 mg | ORAL_TABLET | Freq: Every day | ORAL | Status: DC

## 2015-09-24 NOTE — Telephone Encounter (Signed)
Done

## 2015-10-17 ENCOUNTER — Encounter: Payer: Self-pay | Admitting: *Deleted

## 2015-10-23 ENCOUNTER — Encounter: Payer: Self-pay | Admitting: Family Medicine

## 2015-10-30 ENCOUNTER — Ambulatory Visit (INDEPENDENT_AMBULATORY_CARE_PROVIDER_SITE_OTHER): Payer: Medicare Other | Admitting: Family Medicine

## 2015-10-30 ENCOUNTER — Encounter: Payer: Self-pay | Admitting: Family Medicine

## 2015-10-30 VITALS — BP 170/90 | HR 68 | Ht 62.0 in | Wt 142.2 lb

## 2015-10-30 DIAGNOSIS — Z5181 Encounter for therapeutic drug level monitoring: Secondary | ICD-10-CM | POA: Diagnosis not present

## 2015-10-30 DIAGNOSIS — E7800 Pure hypercholesterolemia, unspecified: Secondary | ICD-10-CM

## 2015-10-30 DIAGNOSIS — G4700 Insomnia, unspecified: Secondary | ICD-10-CM | POA: Diagnosis not present

## 2015-10-30 DIAGNOSIS — I10 Essential (primary) hypertension: Secondary | ICD-10-CM | POA: Diagnosis not present

## 2015-10-30 DIAGNOSIS — F411 Generalized anxiety disorder: Secondary | ICD-10-CM | POA: Diagnosis not present

## 2015-10-30 DIAGNOSIS — M8580 Other specified disorders of bone density and structure, unspecified site: Secondary | ICD-10-CM

## 2015-10-30 DIAGNOSIS — E039 Hypothyroidism, unspecified: Secondary | ICD-10-CM

## 2015-10-30 NOTE — Patient Instructions (Signed)
  Your blood pressure is higher in the office than at home, but even at home it is running above goal. Goal blood pressure is <135/85, definitely under 140 systolic consistently. I would like for you to try to increase the avapro to 300mg /day.  You mentioned wanting to try it twice daily--start taking 150mg  twice daily.  If you are tolerating this without problems, you can try and work the two doses closer together to then be able to switch to taking all 300mg  at one time.  If you are able to take them together, then we will switch you to a 300mg  tablet. If you cannot tolerate taking them together (feel better taking it separately) we can try and change the prescription to 150mg  twice daily, and change the quantity given each month (if your insurance allows).  With the increase in this medication, we are supposed to check the blood work for your kidneys and potassium within a month of the higher dose.  Bring your list of blood pressures to your appointment. We will do bloodwork and refill the medication at your visit.

## 2015-10-30 NOTE — Progress Notes (Signed)
Chief Complaint  Patient presents with  . Hypertension    6 month nonfasting med check.    Anxiety:  She has been on the full 10mg of lexapro without side effects, and feels that she is doing pretty well with respect to her moods and anxiety.  She never needed to take alprazolam.  Husband seems to be stable, on aricept. She has come to terms with it, and doing well with understanding what their life is like now.  Hypertension follow-up:  She is compliant with taking her avapro daily.  No headaches, dizziness, chest pain, palpitations. Denies edema; denies side effects. BP's range from 129-147/62-82, most often 135-145/mid'70's.  She has some very mild vertigo intermittently, very slight last week.  Hyperlipidemia follow-up: Patient is reportedly following a low-fat, low cholesterol diet. Compliant with medications and denies medication side effects.   Hypothyroidism: Thyroid biopsy in 2011 was benign; had seen Dr. Balan in the past, but we are now managing her medications. Denies fatigue, hair, skin, bowel changes. Denies any mood changes. Denies dysphagia or any thyroid concerns. Takes Synthroid on empty stomach, separate from food and other medications.  Insomnia: Well controlled with Seroquel; has been on it for well over 5 years.   Osteopenia: Takes Calcium with D BID, gets regular exercise. She has been getting weight-bearing exercise through [employer]. Last DEXA 02/2015 with slight decline. T-1.7, but now the FRAX score at hip is >3%. She reports previously taking Actonel for 10 years, stopped it on her own. Doesn't want to restart meds.  Upcoming beach trip with the entire family--she is looking forward to this.  PMH, PSH, SH reviewed.   Outpatient Encounter Prescriptions as of 10/30/2015  Medication Sig Note  . aspirin 81 MG tablet Take 81 mg by mouth daily.     . atorvastatin (LIPITOR) 20 MG tablet Take 1 tablet (20 mg total) by mouth daily.   . Calcium Carbonate-Vitamin  D (CALTRATE 600+D PO) Take 1 tablet by mouth 2 (two) times daily.     . escitalopram (LEXAPRO) 10 MG tablet TAKE 1/2 TABLET BY MOUTH ONCE DAILY IN THE MORNING. INCREASE TO FULL TABLET AFTER 1-2 WEEKS (Patient taking differently: Take 10 mg by mouth daily. )   . irbesartan (AVAPRO) 150 MG tablet Take 1 tablet (150 mg total) by mouth at bedtime.   . Multiple Vitamins-Minerals (CENTRUM SILVER PO) Take 1 tablet by mouth daily.     . QUEtiapine (SEROQUEL) 25 MG tablet Take 1 tablet (25 mg total) by mouth at bedtime.   . SYNTHROID 50 MCG tablet Take 1 tablet (50 mcg total) by mouth daily before breakfast.   . ALPRAZolam (XANAX) 0.25 MG tablet Take 1 tablet (0.25 mg total) by mouth 3 (three) times daily as needed for anxiety. (Patient not taking: Reported on 07/10/2015) 07/10/2015: Has never taken this  . fluocinonide (LIDEX) 0.05 % gel Apply 1 application topically 4 (four) times daily. Reported on 10/30/2015 01/06/2011: Prescribed by Dentist for Canker Sores   No facility-administered encounter medications on file as of 10/30/2015.   She recently started taking Allegra daily for allergies, along with tea with local honey.   Allergies  Allergen Reactions  . Amoxicillin Rash  . Erythromycin Rash  . Sulfa Antibiotics Rash   ROS:  Denies fever, chills, headaches, dizziness, no chest pain, palpitations, shortness of breath, URI symptoms.  Mild allergies that are improved with allegra and honey. Denies nausea, vomiting, diarrhea or other bowel changes. No hair/skin/nail/weight/bowel/energy changes.  PHYSICAL EXAM: BP   170/90 mmHg  Pulse 68  Ht 5' 2" (1.575 m)  Wt 142 lb 3.2 oz (64.501 kg)  BMI 26.00 kg/m2  162/74 on repeat by MD  Well developed, pleasant female, in good spirits HEENT: PERRL, EOMI, conjunctiva clear. OP is clear. Neck: no lymphadenopathy, thyromegaly or carotid bruit Heart: regular rate and rhythm without murmur Lungs: clear bilaterally Abdomen: soft, nontender, no organomegaly or  mass Extremities: no edema, 2+ pulse Neuro: alert and oriented, cranial nerves intact. Normal strength, sensation.  Psych: normal mood, affect, hygiene and grooming.   ASSESSMENT/PLAN:  Essential hypertension, benign - high today; borderline at home.  increase avapro to 300mg daily; pt prefers to start 150mg BID, work on taking at same time. monitor BP; cont low Na diet  Hypothyroidism, unspecified hypothyroidism type - euthyroid by history; normal TSH in September. Continue current dose  Insomnia - well controlled on current regimen  Pure hypercholesterolemia - at goal per September labs.  Continue current medications and low cholesterol diet  Osteopenia - continue calcium, D, weight-bearing exercise.  repeat DEXA 02/2017  Generalized anxiety disorder - controlled on lexapro; continue   All meds were refilled in February x 90 days.  Some of these bottles she hasn't even started yet. She will contact her pharmacy when refills are needed.   All of September's labs were reviewed and all normal (calcium slightly high but albumin was 4.7, corrects) No labs needed today  Return in 6 months for CPE/AWV with labs prior  c-met, lipids, TSH, CBC   Your blood pressure is higher in the office than at home, but even at home it is running above goal. Goal blood pressure is <135/85, definitely under 140 systolic consistently. I would like for you to try to increase the avapro to 300mg/day.  You mentioned wanting to try it twice daily--start taking 150mg twice daily.  If you are tolerating this without problems, you can try and work the two doses closer together to then be able to switch to taking all 300mg at one time.  If you are able to take them together, then we will switch you to a 300mg tablet. If you cannot tolerate taking them together (feel better taking it separately) we can try and change the prescription to 150mg twice daily, and change the quantity given each month (if your  insurance allows).  With the increase in this medication, we are supposed to check the blood work for your kidneys and potassium within a month of the higher dose.  Bring your list of blood pressures to your appointment. We will do bloodwork and refill the medication at your visit.  

## 2015-11-12 ENCOUNTER — Other Ambulatory Visit: Payer: Self-pay | Admitting: Family Medicine

## 2015-12-12 ENCOUNTER — Ambulatory Visit (INDEPENDENT_AMBULATORY_CARE_PROVIDER_SITE_OTHER): Payer: Medicare Other | Admitting: Family Medicine

## 2015-12-12 ENCOUNTER — Encounter: Payer: Self-pay | Admitting: Family Medicine

## 2015-12-12 VITALS — BP 134/80 | HR 68 | Ht 62.0 in | Wt 145.6 lb

## 2015-12-12 DIAGNOSIS — Z5181 Encounter for therapeutic drug level monitoring: Secondary | ICD-10-CM

## 2015-12-12 DIAGNOSIS — I10 Essential (primary) hypertension: Secondary | ICD-10-CM

## 2015-12-12 LAB — BASIC METABOLIC PANEL
BUN: 12 mg/dL (ref 7–25)
CHLORIDE: 100 mmol/L (ref 98–110)
CO2: 24 mmol/L (ref 20–31)
CREATININE: 0.52 mg/dL — AB (ref 0.60–0.93)
Calcium: 10 mg/dL (ref 8.6–10.4)
Glucose, Bld: 82 mg/dL (ref 65–99)
Potassium: 4.3 mmol/L (ref 3.5–5.3)
Sodium: 135 mmol/L (ref 135–146)

## 2015-12-12 MED ORDER — IRBESARTAN 300 MG PO TABS: 300.0000 mg | ORAL_TABLET | Freq: Every day | ORAL | Status: DC

## 2015-12-12 NOTE — Patient Instructions (Signed)
Continue your current medications--feel free to take both 150mg  irbesartan tablets together until you finish them.  Your new prescription is for the 300mg  tablet--take just ONE tablet once daily  Return as scheduled in September, sooner if needed. Bring your list of blood pressures to your visit.

## 2015-12-12 NOTE — Progress Notes (Signed)
Chief Complaint  Patient presents with  . Hypertension    1 month follow up on blood pressure.    Hypertension:  Avapro dose was increased to 300mg  daily at her last visit 5 weeks ago. She has been taking 150mg  twice daily--forgot to try and work towards taking them at the same time; takes one in the morning and one at lunch. Denies headaches, dizziness, side effects.  BP's at home are ranging from 124-151/66-81 (upper 130's/high 70's).   Anxiety is well controlled, doing well on the Lexapro. She continues to go to [employer] [facility], and her husband has been doing fairly well overall, now on Aricept.  PMH, PSH, SH reviewed, unchanged  Current Outpatient Prescriptions on File Prior to Visit  Medication Sig Dispense Refill  . aspirin 81 MG tablet Take 81 mg by mouth daily.      Marland Kitchen. atorvastatin (LIPITOR) 20 MG tablet Take 1 tablet by mouth  daily 90 tablet 0  . Calcium Carbonate-Vitamin D (CALTRATE 600+D PO) Take 1 tablet by mouth 2 (two) times daily.      Marland Kitchen. escitalopram (LEXAPRO) 10 MG tablet Take one-half tablet by  mouth once daily in the  morning . Increase to 1  tablet by mouth daily after 1-2 weeks (Patient taking differently: daily) 90 tablet 0  . Multiple Vitamins-Minerals (CENTRUM SILVER PO) Take 1 tablet by mouth daily.      . QUEtiapine (SEROQUEL) 25 MG tablet Take 1 tablet (25 mg total) by mouth at bedtime. 90 tablet 0  . SYNTHROID 50 MCG tablet Take 1 tablet by mouth  daily before breakfast 90 tablet 0  . ALPRAZolam (XANAX) 0.25 MG tablet Take 1 tablet (0.25 mg total) by mouth 3 (three) times daily as needed for anxiety. (Patient not taking: Reported on 07/10/2015) 10 tablet 0  . fluocinonide (LIDEX) 0.05 % gel Apply 1 application topically 4 (four) times daily. Reported on 12/12/2015     No current facility-administered medications on file prior to visit.   Irbesartan (Avapro) 150mg  twice daily prior to today's visit.  Allergies  Allergen Reactions  . Amoxicillin Rash  .  Erythromycin Rash  . Sulfa Antibiotics Rash   ROS:  No headaches, dizziness, chest pain, palpitations, shortness of breath, edema. No fever, chills, URI symptoms, bleeding, bruising, rashes.  Very rare mild vertigo.  PHYSICAL EXAM: BP 134/80 mmHg  Pulse 68  Ht 5\' 2"  (1.575 m)  Wt 145 lb 9.6 oz (66.044 kg)  BMI 26.62 kg/m2  Well developed, pleasant female in no distress Neck: no lymphadenopathy, thyromegaly or mass Heart: regular rate and rhythm without murmur Lungs: clear bilaterally Extremities: no edema Psych: normal mood, affect, hygiene and grooming  ASSESSMENT/PLAN:  Essential hypertension, benign - improved control; continue 300mg  Avapro - Plan: Basic metabolic panel, irbesartan (AVAPRO) 300 MG tablet  Medication monitoring encounter - Plan: Basic metabolic panel    F/u in September for physical, as scheduled.

## 2015-12-15 DIAGNOSIS — H2513 Age-related nuclear cataract, bilateral: Secondary | ICD-10-CM | POA: Diagnosis not present

## 2016-01-27 ENCOUNTER — Telehealth: Payer: Self-pay | Admitting: Family Medicine

## 2016-01-27 NOTE — Telephone Encounter (Signed)
They changed some of the dad's meds and he is doing better.  Mom had been doing okay, but her best friend from grade school passed away this past Sunday, and she isn't handling it well.  She is "ill", irritable to her daughter, grandson. She reports this is typical for how she handles grief, taking it out on her/others, and that it lasts much longer than it wold for other people.  She spoke to her brother for a long time last night.  They spoke about wanting her mother to take xanax twice daily, regularly.  She is concerned about her having a stroke, due to the anxiety raising her BP.  She took it once in the past when she broke her arm, and they report she was a different person--happier, more relaxed, and they want to see her that way again.  40 minute phone conversation, providing counseling to her, listening to her concerns, explaining guidelines and rationale for not going to scheduled benzos as firstline treatment in the elderly, and the recommendation to titrate up preventative meds instead, using the benzos if needed.  She expressed that celexa was much more effective for her than lexapro was, when going through a divorce (discussed this--not likely to make a significant difference in her mother).  Encouraged her to have her mother follow up to discuss her anxiety and moods, and medications. She can also check to see if she ever took any of the alprazolam that was prescribed (#10) back in October (when the lexapro was first started)--I don't know that she ever took any of the pills.

## 2016-01-27 NOTE — Telephone Encounter (Signed)
Pt's daughter came in (she is on HIPAA) and requested a call from Dr. Lynelle DoctorKnapp. She states she was very concerned about her mother. She stated that her  mother was "losing it" due to issues with her father. Pt's daughter can be reached at [phone].

## 2016-02-09 ENCOUNTER — Other Ambulatory Visit: Payer: Self-pay | Admitting: Family Medicine

## 2016-02-09 NOTE — Telephone Encounter (Signed)
done

## 2016-02-09 NOTE — Telephone Encounter (Signed)
Is this okay to refill? 

## 2016-02-10 ENCOUNTER — Other Ambulatory Visit: Payer: Self-pay | Admitting: Family Medicine

## 2016-02-11 ENCOUNTER — Ambulatory Visit (INDEPENDENT_AMBULATORY_CARE_PROVIDER_SITE_OTHER): Payer: Medicare Other | Admitting: Family Medicine

## 2016-02-11 ENCOUNTER — Encounter: Payer: Self-pay | Admitting: Family Medicine

## 2016-02-11 VITALS — BP 126/82 | HR 78 | Wt 144.0 lb

## 2016-02-11 DIAGNOSIS — M9902 Segmental and somatic dysfunction of thoracic region: Secondary | ICD-10-CM

## 2016-02-11 DIAGNOSIS — M150 Primary generalized (osteo)arthritis: Secondary | ICD-10-CM

## 2016-02-11 DIAGNOSIS — M159 Polyosteoarthritis, unspecified: Secondary | ICD-10-CM

## 2016-02-11 DIAGNOSIS — M999 Biomechanical lesion, unspecified: Secondary | ICD-10-CM | POA: Insufficient documentation

## 2016-02-11 DIAGNOSIS — M9901 Segmental and somatic dysfunction of cervical region: Secondary | ICD-10-CM

## 2016-02-11 DIAGNOSIS — M1990 Unspecified osteoarthritis, unspecified site: Secondary | ICD-10-CM | POA: Insufficient documentation

## 2016-02-11 DIAGNOSIS — M9908 Segmental and somatic dysfunction of rib cage: Secondary | ICD-10-CM

## 2016-02-11 NOTE — Progress Notes (Signed)
Pre visit review using our clinic review tool, if applicable. No additional management support is needed unless otherwise documented below in the visit note. 

## 2016-02-11 NOTE — Assessment & Plan Note (Signed)
I do believe the patient does have osteoarthritic changes of multiple joints. We discussed patient posture. Ergonomics, we discussed icing and over-the-counter medications. I do believe that patient's generalized anxiety disorder is also contribute in. We discussed icing regimen. We discussed proper shoes. Patient will come back and see me again in 3-4 weeks. Did respond well to osteopathic manipulation.

## 2016-02-11 NOTE — Progress Notes (Signed)
Tawana ScaleZach Gertha Lichtenberg D.O. [facility] [address] [place], [place] [address] Phone: [phone] Subjective:    I'm seeing this patient by the request  of:  KNAPP,[name] A, MD  CC: multiple joint pains  QMV:[id number]:Subjective Lisa [name] is a 75 y.o. female coming in with complaint of multiple joint pains. States that it seems to be more of the neck, low back, as well as left knee. Patient states that she has aches and pains occasionally. Had been seen a chiropractor with some minimal improvement. Feels that massage is very beneficial. Patient has been taking some other medications to try to make this better but has not notice anything significant. Patient states different bed seem to make the difference. This no stress plays a role in patient's has been recently had Alzheimer's. 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 Patient describes the pain as more of a dull, throbbing aching sensation. Patient rates the severity of 6 out of 10. Denies any radiation down the arms or legs. Patient states though that it can give her significant pain when she wakes up in the morning multiple minutes to move correctly. .  Left knee has a history of an open meniscectomy multiple decades ago. States that the pain seems to give her discomfort over the medial aspect. Rates the severity of pain a 6 out of 10. Patient denies any instability or giving out on her.     Past Medical History  Diagnosis Date  . Allergy   . Leukopenia   . Osteopenia   . OA (osteoarthritis)     hips, knees and LS  . Psoriasis   . Hypertension   . Hypercholesteremia   . Insomnia   . Colon polyps     hyperplastic  . Vitamin D deficiency   . Multinodular thyroid    Past Surgical History  Procedure Laterality Date  . Flexible sigmoidoscopy  05-2000  . Cardiolyte  02-2002  . Colonoscopy  12-2006  . Tonsillectomy and adenoidectomy    . Knee surgery  1969    Left, cartilage  . Hand tendon  surgery  1979    L 4th finger   Social History   Social History  . Marital Status: Married    Spouse Name: N/A  . Number of Children: 2  . Years of Education: N/A   Occupational History  . 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 Smoking status: Never Smoker   . Smokeless tobacco: Never Used  . Alcohol Use: 0.0 oz/week    0 Standard drinks or equivalent per week     Comment: socially, 2 drinks twice weekly  . Drug Use: No  . Sexual Activity:    Partners: Male   Other Topics Concern  . Not on file   Social History Narrative   Lives with her husband, who has cognitive deficits.  Son is in [place] (with 2 grandchildren there), daughter and 1 grandson are local.     Allergies  Allergen Reactions  . Amoxicillin Rash  . Erythromycin Rash  . Sulfa Antibiotics Rash   Family History  Problem Relation Age of Onset  . Allergies Mother   . Hypertension Mother   . Vision loss Mother   . Heart disease Mother   . Arthritis Mother   . Arthritis Father   . Heart disease Father [id number]  . Hearing loss Sister   . Hypertension Sister   . Hyperlipidemia  Sister   . Arthritis Sister   . Heart disease Sister [id number]    massive MI  . Stroke Maternal Grandmother   . Heart disease Maternal Grandfather   . Stroke Maternal Grandfather   . Heart disease Paternal Grandmother   . Stroke Paternal Grandmother   . Cancer Neg Hx   . Arthritis Sister   . Heart disease Sister   . Hyperlipidemia Sister   . Hypertension Sister   . Vision loss Sister     macular degeneration  . Arthritis Sister   . Hypertension Sister   . Hyperlipidemia Sister   . Thyroid disease Sister   . Diabetes Sister [id number]    diet controlled    Past medical history, social, surgical and family history all reviewed in electronic medical record.  No pertanent information unless stated regarding to the chief complaint.   Review of Systems: No headache, visual changes, nausea, vomiting,  diarrhea, constipation, dizziness, abdominal pain, skin rash, fevers, chills, night sweats, weight loss, swollen lymph nodes, body aches, joint swelling, muscle aches, chest pain, shortness of breath, mood changes.   Objective Blood pressure 126/82, pulse 78, weight 144 lb (65.318 kg), SpO2 97 %.  General: No apparent distress alert and oriented x3 mood and affect normal, dressed appropriately.  HEENT: Pupils equal, extraocular movements intact  Respiratory: Patient's speak in full sentences and does not appear short of breath  Cardiovascular: No lower extremity edema, non tender, no erythema  Skin: Warm dry intact with no signs of infection or rash on extremities or on axial skeleton.  Abdomen: Soft nontender  Neuro: Cranial nerves II through XII are intact, neurovascularly intact in all extremities with 2+ DTRs and 2+ pulses.  Lymph: No lymphadenopathy of posterior or anterior cervical chain or axillae bilaterally.  Gait normal with good balance and coordination.  MSK:  Non tender with full range of motion and good stability and symmetric strength and tone of shoulders, elbows, wrist, hip and ankles bilaterally. Arthritic changes multiple joints Neck: Inspection mild increase in lordosis No palpable stepoffs. Negative Spurling's maneuver. Next last 5 of extension Grip strength and sensation normal in bilateral hands Strength good C4 to T1 distribution No sensory change to C4 to T1 Negative Hoffman sign bilaterally Reflexes normal Back Exam:  Inspection: I'll increase in kyphosis with poor posture Motion: Flexion 35 deg, Extension 15 deg, Side Bending to 25 deg bilaterally,  Rotation to 25 deg bilaterally  SLR laying: Negative  XSLR laying: Negative  Palpable tenderness: mild tenderness to palpation of the paraspinal [place]ket trauma the lumbar spine FABER: tightness but negative Sensory change: Gross sensation intact to all lumbar and sacral dermatomes.  Reflexes: 2+ at both patellar  tendons, 2+ at achilles tendons, Babinski's downgoing.  Strength at foot  Plantar-flexion: 5/5 Dorsi-flexion: 5/5 Eversion: 5/5 Inversion: 5/5  Leg strength  Quad: 5/5 Hamstring: 5/5 Hip flexor: 5/5 Hip abductors: 4/5  Gait unremarkable.  Left knee shows advanced nevus have scar on the medial aspect of the knee. Patient does have moderate degenerative changes. Patient had some mild instability with valgus force. Patient has full range of motion but does have crepitus. Tenderness to palpation over the medial joint line. Neurovascular intact distally.  Osteopathic findings C2 F RS right T3-7 E R right side bent left inhaled 3rd rib.     Impression and Recommendations:     This case required medical decision making of moderate complexity.      Note: This dictation was prepared with Dragon dictation along  with smaller phrase technology. Any transcriptional errors that result from this process are unintentional.

## 2016-02-11 NOTE — Assessment & Plan Note (Signed)
Decision today to treat with OMT was based on Physical Exam  After verbal consent patient was treated with ME, FPR, ST techniques in Cervical, thoracic and rib areas  Patient tolerated the procedure well with improvement in symptoms  Patient given exercises, stretches and lifestyle modifications  See medications in patient instructions if given  Patient will follow up in 3-4 weeks

## 2016-02-11 NOTE — Patient Instructions (Signed)
Good to see you.  Ice 20 minutes 2 times daily. Usually after activity and before bed. On wall with heels, butt shoulder and head touching for a goal of 5 minutes daily  Vitamin D 2000IU daily  Turmeric 500mg  daily  Tart cherry extract at night any dose you can find.  This will all help with arthritis pain  If a lot of pain pennsaid pinkie amount topically 2 times daily as needed.  For shoes look into Newtons See me again in 3-4 weeks.

## 2016-02-13 ENCOUNTER — Telehealth: Payer: Self-pay | Admitting: Family Medicine

## 2016-02-13 DIAGNOSIS — G4700 Insomnia, unspecified: Secondary | ICD-10-CM

## 2016-02-13 MED ORDER — QUETIAPINE FUMARATE 25 MG PO TABS: 25.0000 mg | ORAL_TABLET | Freq: Every day | ORAL | Status: DC

## 2016-02-13 NOTE — Telephone Encounter (Signed)
Requesting refill on Quetiapine 25 mg

## 2016-02-13 NOTE — Telephone Encounter (Signed)
done

## 2016-02-19 ENCOUNTER — Encounter: Payer: Self-pay | Admitting: Family Medicine

## 2016-02-28 ENCOUNTER — Other Ambulatory Visit: Payer: Self-pay | Admitting: Family Medicine

## 2016-03-02 NOTE — Progress Notes (Signed)
[name] [address] [place], [place] [address] Phone: [phone] Subjective:    I'm seeing this patient by the request  of:  KNAPP,[name] A, MD  CC: multiple joint pains  [id number]  Lisa [name] is a 75 y.o. female coming in with complaint of multiple joint pains. Having chronic neck and back pain. Has been told that she's had arthritis previously. Patient elected to try osteopathic manipulation, home exercises and over-the-counter natural supplementations. Patient states  Left knee patient was found to have moderate degenerative changes as well as some intermittent instability of the knee. We elected to try different shoes, over-the-counter natural supplementations to help with her arthritic pain as well as topical anti-inflammatories. Patient states     Past Medical History:  Diagnosis Date  . Allergy   . Colon polyps    hyperplastic  . Hypercholesteremia   . Hypertension   . Insomnia   . Leukopenia   . Multinodular thyroid   . OA (osteoarthritis)    hips, knees and LS  . Osteopenia   . Psoriasis   . Vitamin D deficiency    Past Surgical History:  Procedure Laterality Date  . cardiolyte  02-2002  . COLONOSCOPY  12-2006  . FLEXIBLE SIGMOIDOSCOPY  05-2000  . HAND TENDON SURGERY  1979   L 4th finger  . KNEE SURGERY  1969   Left, cartilage  . TONSILLECTOMY AND ADENOIDECTOMY     Social History   Social History  . Marital status: Married    Spouse name: N/A  . Number of children: 2  . 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 Smoking status: Never Smoker  . Smokeless tobacco: Never Used  . Alcohol use 0.0 oz/week     Comment: socially, 2 drinks twice weekly  . Drug use: No  . Sexual activity: Not Currently    Partners: Male   Other Topics Concern  . Not on file   Social History Narrative   Lives with her husband, who has  cognitive deficits.  Son is in [place] (with 2 grandchildren there), daughter and 1 grandson are local.     Allergies  Allergen Reactions  . Amoxicillin Rash  . Erythromycin Rash  . Sulfa Antibiotics Rash   Family History  Problem Relation Age of Onset  . Allergies Mother   . Hypertension Mother   . Vision loss Mother   . Heart disease Mother   . Arthritis Mother   . Arthritis Father   . Heart disease Father 17  . Hearing loss Sister   . Hypertension Sister   . Hyperlipidemia Sister   . Arthritis Sister   . Heart disease Sister 91    massive MI  . Stroke Maternal Grandmother   . Heart disease Maternal Grandfather   . Stroke Maternal Grandfather   . Heart disease Paternal Grandmother   . Stroke Paternal Grandmother   . Cancer Neg Hx   . Arthritis Sister   . Heart disease Sister   . Hyperlipidemia Sister   . Hypertension Sister   . Vision loss Sister     macular degeneration  . Arthritis Sister   . Hypertension Sister   . Hyperlipidemia Sister   . Thyroid disease Sister   . Diabetes Sister 56    diet controlled    Past medical history, social, surgical and family history all reviewed in electronic  medical record.  No pertanent information unless stated regarding to the chief complaint.   Review of Systems: No headache, visual changes, nausea, vomiting, diarrhea, constipation, dizziness, abdominal pain, skin rash, fevers, chills, night sweats, weight loss, swollen lymph nodes, body aches, joint swelling, muscle aches, chest pain, shortness of breath, mood changes.   Objective  There were no vitals taken for this visit.  General: No apparent distress alert and oriented x3 mood and affect normal, dressed appropriately.  HEENT: Pupils equal, extraocular movements intact  Respiratory: Patient's speak in full sentences and does not appear short of breath  Cardiovascular: No lower extremity edema, non tender, no erythema  Skin: Warm dry intact with no signs of infection or  rash on extremities or on axial skeleton.  Abdomen: Soft nontender  Neuro: Cranial nerves II through XII are intact, neurovascularly intact in all extremities with 2+ DTRs and 2+ pulses.  Lymph: No lymphadenopathy of posterior or anterior cervical chain or axillae bilaterally.  Gait normal with good balance and coordination.  MSK:  Non tender with full range of motion and good stability and symmetric strength and tone of shoulders, elbows, wrist, hip and ankles bilaterally. Arthritic changes multiple joints Neck: Inspection mild increase in lordosis No palpable stepoffs. Negative Spurling's maneuver. Next last 5 of extension Grip strength and sensation normal in bilateral hands Strength good C4 to T1 distribution No sensory change to C4 to T1 Negative Hoffman sign bilaterally Reflexes normal Back Exam:  Inspection: I'll increase in kyphosis with poor posture Motion: Flexion 35 deg, Extension 15 deg, Side Bending to 25 deg bilaterally,  Rotation to 25 deg bilaterally  SLR laying: Negative  XSLR laying: Negative  Palpable tenderness: mild tenderness to palpation of the paraspinal Musket trauma the lumbar spine FABER: tightness but negative Sensory change: Gross sensation intact to all lumbar and sacral dermatomes.  Reflexes: 2+ at both patellar tendons, 2+ at achilles tendons, Babinski's downgoing.  Strength at foot  Plantar-flexion: 5/5 Dorsi-flexion: 5/5 Eversion: 5/5 Inversion: 5/5  Leg strength  Quad: 5/5 Hamstring: 5/5 Hip flexor: 5/5 Hip abductors: 4/5  Gait unremarkable.  Left knee shows advanced nevus have scar on the medial aspect of the knee. Patient does have moderate degenerative changes. Patient had some mild instability with valgus force. Patient has full range of motion but does have crepitus. Tenderness to palpation over the medial joint line. Neurovascular intact distally.  Osteopathic findings C2 F RS right T3-7 E R right side bent left inhaled 3rd rib.       Impression and Recommendations:     This case required medical decision making of moderate complexity.      Note: This dictation was prepared with Dragon dictation along with smaller phrase technology. Any transcriptional errors that result from this process are unintentional.          Terrilee Files D.O. [facility] [address] [place], [place] [address] Phone: [phone] Subjective:    I'm seeing this patient by the request  of:  KNAPP,[name] A, MD  CC: multiple joint pains  [id number]  Lisa [name] is a 75 y.o. female coming in with complaint of multiple joint pains. States that it seems to be more of the neck, low back, as well as left knee.  Patient states that the low back in the neck seems to be doing relatively well. We discussed icing regimen. We discussed which activities to do when she is been doing fairly regularly. Working on posture. Does respond well to  manipulation previously. Patient still has some tightness but overall seems to be making some improvement.  Left knee has a history of an open meniscectomy multiple decades ago. States this seems to be doing fairly well as well. We discussed possible bracing which patient did not want to do. Continuing to monitor closely.     Past Medical History:  Diagnosis Date  . Allergy   . Colon polyps    hyperplastic  . Hypercholesteremia   . Hypertension   . Insomnia   . Leukopenia   . Multinodular thyroid   . OA (osteoarthritis)    hips, knees and LS  . Osteopenia   . Psoriasis   . Vitamin D deficiency    Past Surgical History:  Procedure Laterality Date  . cardiolyte  02-2002  . COLONOSCOPY  12-2006  . FLEXIBLE SIGMOIDOSCOPY  05-2000  . HAND TENDON SURGERY  1979   L 4th finger  . KNEE SURGERY  1969   Left, cartilage  . TONSILLECTOMY AND ADENOIDECTOMY     Social History   Social History  . Marital status: Married    Spouse name: N/A  . Number of children: 2  . 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 Smoking status: Never Smoker  . Smokeless tobacco: Never Used  . Alcohol use 0.0 oz/week     Comment: socially, 2 drinks twice weekly  . Drug use: No  . Sexual activity: Not Currently    Partners: Male   Other Topics Concern  . Not on file   Social History Narrative   Lives with her husband, who has cognitive deficits.  Son is in [place] (with 2 grandchildren there), daughter and 1 grandson are local.     Allergies  Allergen Reactions  . Amoxicillin Rash  . Erythromycin Rash  . Sulfa Antibiotics Rash   Family History  Problem Relation Age of Onset  . Allergies Mother   . Hypertension Mother   . Vision loss Mother   . Heart disease Mother   . Arthritis Mother   . Arthritis Father   . Heart disease Father 65  . Hearing loss Sister   . Hypertension Sister   . Hyperlipidemia Sister   . Arthritis Sister   . Heart disease Sister 64    massive MI  . Stroke Maternal Grandmother   . Heart disease Maternal Grandfather   . Stroke Maternal Grandfather   . Heart disease Paternal Grandmother   . Stroke Paternal Grandmother   . Cancer Neg Hx   . Arthritis Sister   . Heart disease Sister   . Hyperlipidemia Sister   . Hypertension Sister   . Vision loss Sister     macular degeneration  . Arthritis Sister   . Hypertension Sister   . Hyperlipidemia Sister   . Thyroid disease Sister   . Diabetes Sister 73    diet controlled    Past medical history, social, surgical and family history all reviewed in electronic medical record.  No pertanent information unless stated regarding to the chief complaint.   Review of Systems: No headache, visual changes, nausea, vomiting, diarrhea, constipation, dizziness, abdominal pain, skin rash, fevers, chills, night sweats, weight loss, swollen lymph nodes, body aches, joint swelling, muscle aches, chest pain, shortness of breath,  mood changes.   Objective  There were no vitals taken for this visit.  General: No apparent distress alert and oriented  x3 mood and affect normal, dressed appropriately.  HEENT: Pupils equal, extraocular movements intact  Respiratory: Patient's speak in full sentences and does not appear short of breath  Cardiovascular: No lower extremity edema, non tender, no erythema  Skin: Warm dry intact with no signs of infection or rash on extremities or on axial skeleton.  Abdomen: Soft nontender  Neuro: Cranial nerves II through XII are intact, neurovascularly intact in all extremities with 2+ DTRs and 2+ pulses.  Lymph: No lymphadenopathy of posterior or anterior cervical chain or axillae bilaterally.  Gait normal with good balance and coordination.  MSK:  Non tender with full range of motion and good stability and symmetric strength and tone of shoulders, elbows, wrist, hip and ankles bilaterally. Arthritic changes multiple joints Neck: Inspection mild increase in lordosis No palpable stepoffs. Negative Spurling's maneuver. Appear full range of motion but still lacks some extension Grip strength and sensation normal in bilateral hands Strength good C4 to T1 distribution No sensory change to C4 to T1 Negative Hoffman sign bilaterally Reflexes normal Back Exam:  Inspection: increase in kyphosis with poor posture Motion: Flexion 35 deg, Extension 15 deg, Side Bending to 25 deg bilaterally,  Rotation to 25 deg bilaterally  SLR laying: Negative  XSLR laying: Negative  Palpable tenderness:nontender on exam today. FABER: tightness but negative Sensory change: Gross sensation intact to all lumbar and sacral dermatomes.  Reflexes: 2+ at both patellar tendons, 2+ at achilles tendons, Babinski's downgoing.  Strength at foot  Plantar-flexion: 5/5 Dorsi-flexion: 5/5 Eversion: 5/5 Inversion: 5/5  Leg strength  Quad: 5/5 Hamstring: 5/5 Hip flexor: 5/5 Hip abductors: 4/5  Gait unremarkable. Mild  improvement   Osteopathic findings C2 F RS right T3-5 E R right side bent left inhaled 3rd rib.     Impression and Recommendations:     This case required medical decision making of moderate complexity.      Note: This dictation was prepared with Dragon dictation along with smaller phrase technology. Any transcriptional errors that result from this process are unintentional.

## 2016-03-03 ENCOUNTER — Ambulatory Visit (INDEPENDENT_AMBULATORY_CARE_PROVIDER_SITE_OTHER): Payer: Medicare Other | Admitting: Family Medicine

## 2016-03-03 ENCOUNTER — Encounter: Payer: Self-pay | Admitting: Family Medicine

## 2016-03-03 DIAGNOSIS — M999 Biomechanical lesion, unspecified: Secondary | ICD-10-CM

## 2016-03-03 DIAGNOSIS — M150 Primary generalized (osteo)arthritis: Secondary | ICD-10-CM

## 2016-03-03 DIAGNOSIS — M9901 Segmental and somatic dysfunction of cervical region: Secondary | ICD-10-CM | POA: Diagnosis not present

## 2016-03-03 DIAGNOSIS — M9902 Segmental and somatic dysfunction of thoracic region: Secondary | ICD-10-CM | POA: Diagnosis not present

## 2016-03-03 DIAGNOSIS — M9908 Segmental and somatic dysfunction of rib cage: Secondary | ICD-10-CM | POA: Diagnosis not present

## 2016-03-03 DIAGNOSIS — M159 Polyosteoarthritis, unspecified: Secondary | ICD-10-CM

## 2016-03-03 NOTE — Patient Instructions (Signed)
You are doing amazing Please tell pat great work! Ice is your friend Keep working on the posture I will see you after labor day but then we will space you out to 6-8 weeks likely  Also look for Newtons.

## 2016-03-03 NOTE — Assessment & Plan Note (Signed)
Patient does have underlying arthritis. Doing very well with conservative therapy. We discussed and encourage her to continue the exercises and the postural. Patient will come back and see me again in 4 weeks. At that time if doing well we can space out to 8 week intervals.

## 2016-03-03 NOTE — Assessment & Plan Note (Signed)
Decision today to treat with OMT was based on Physical Exam  After verbal consent patient was treated with ME, FPR, ST techniques in Cervical, thoracic and rib areas  Patient tolerated the procedure well with improvement in symptoms  Patient given exercises, stretches and lifestyle modifications  See medications in patient instructions if given  Patient will follow up in 3-4 weeks

## 2016-03-09 ENCOUNTER — Encounter: Payer: Self-pay | Admitting: Family Medicine

## 2016-04-12 ENCOUNTER — Other Ambulatory Visit: Payer: Self-pay | Admitting: Family Medicine

## 2016-04-13 NOTE — Telephone Encounter (Signed)
Has appt end of December.  Last filled early July, will last until visit, but she gets from mail order, so will approve

## 2016-04-13 NOTE — Telephone Encounter (Signed)
Is this okay to refill? 

## 2016-04-21 NOTE — Progress Notes (Signed)
Lisa Lisa [name] [address] [place], [place] [address] Phone: [phone] Subjective:    I'm seeing this patient by the request  of:  Lisa Lisa [name],Lisa Lisa [name], Lisa Lisa [name]  CC: multiple joint pain f/u  [id number]  Lisa Lisa [name] is Lisa [name] 75 y.o. female coming in with complaint of multiple joint pains. Having chronic neck and back pain. Has been told that she's had arthritis previously. Patient elected to try osteopathic manipulation, home exercises and over-the-counter natural supplementations. Patient states overall doing well with monthly massages.  Taking medicines less and being more active. Happy with the results.    Left knee is improved.      Past Medical History:  Diagnosis Date  . Allergy   . Colon polyps    hyperplastic  . Hypercholesteremia   . Hypertension   . Insomnia   . Leukopenia   . Multinodular thyroid   . OA (osteoarthritis)    hips, knees and LS  . Osteopenia   . Psoriasis   . Vitamin D deficiency    Past Surgical History:  Procedure Laterality Date  . cardiolyte  02-2002  . COLONOSCOPY  12-2006  . FLEXIBLE SIGMOIDOSCOPY  05-2000  . HAND TENDON SURGERY  1979   L 4th finger  . KNEE SURGERY  1969   Left, cartilage  . TONSILLECTOMY AND ADENOIDECTOMY     Social History   Social History  . Marital status: Married    Spouse name: N/Lisa [name]  . Number of children: 2  . 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 Smoking status: Never Smoker  . Smokeless tobacco: Never Used  . Alcohol use 0.0 oz/week     Comment: socially, 2 drinks twice weekly  . Drug use: No  . Sexual activity: Not Currently    Partners: Male   Other Topics Concern  . Not on file   Social History Narrative   Lives with her husband, who has cognitive deficits.  Son is in [place] (with 2 grandchildren there), daughter and 1 grandson are local.     Allergies  Allergen Reactions  .  Amoxicillin Rash  . Erythromycin Rash  . Sulfa Antibiotics Rash   Family History  Problem Relation Age of Onset  . Allergies Mother   . Hypertension Mother   . Vision loss Mother   . Heart disease Mother   . Arthritis Mother   . Arthritis Father   . Heart disease Father 53  . Hearing loss Sister   . Hypertension Sister   . Hyperlipidemia Sister   . Arthritis Sister   . Heart disease Sister 65    massive MI  . Stroke Maternal Grandmother   . Heart disease Maternal Grandfather   . Stroke Maternal Grandfather   . Heart disease Paternal Grandmother   . Stroke Paternal Grandmother   . Cancer Neg Hx   . Arthritis Sister   . Heart disease Sister   . Hyperlipidemia Sister   . Hypertension Sister   . Vision loss Sister     macular degeneration  . Arthritis Sister   . Hypertension Sister   . Hyperlipidemia Sister   . Thyroid disease Sister   . Diabetes Sister 37    diet controlled    Past medical history, social, surgical and family history all reviewed in electronic medical record.  No pertanent information unless stated regarding to the chief complaint.  Review of Systems: No headache, visual changes, nausea, vomiting, diarrhea, constipation, dizziness, abdominal pain, skin rash, fevers, chills, night sweats, weight loss, swollen lymph nodes, body aches, joint swelling, muscle aches, chest pain, shortness of breath, mood changes.   Objective  There were no vitals taken for this visit.  General: No apparent distress alert and oriented x3 mood and affect normal, dressed appropriately.  HEENT: Pupils equal, extraocular movements intact  Respiratory: Patient's speak in full sentences and does not appear short of breath  Cardiovascular: No lower extremity edema, non tender, no erythema  Skin: Warm dry intact with no signs of infection or rash on extremities or on axial skeleton.  Abdomen: Soft nontender  Neuro: Cranial nerves II through XII are intact, neurovascularly intact in  all extremities with 2+ DTRs and 2+ pulses.  Lymph: No lymphadenopathy of posterior or anterior cervical chain or axillae bilaterally.  Gait normal with good balance and coordination. +/5  Gait unremarkable.  Left knee shows  Moderate valgus deformity and mild instability with valgus forse minimal pain today.   Osteopathic findings C2 F RS right T3-7 E R right side bent left inhaled 3rd rib.  Same pattern   Impression and Recommendations:     This case required medical decision making of moderate complexity.      Note: This dictation was prepared with Dragon dictation along with smaller phrase technology. Any transcriptional errors that result from this process are unintentional.          Lisa Files D.O. Lisa Lisa [name] [address] [place], [place] [address] Phone: [phone] Subjective:    I'm seeing this patient by the request  of:  Lisa Lisa [name],Lisa Lisa [name], Lisa Lisa [name]  CC: multiple joint pains  [id number]  Lisa Lisa [name] is Lisa [name] 75 y.o. female coming in with complaint of multiple joint pains. States that it seems to be more of the neck, low back, as well as left knee.  Patient states that the low back in the neck seems to be doing relatively well. We discussed icing regimen. We discussed which activities to do when she is been doing fairly regularly. Working on posture. Does respond well to manipulation previously. Patient still has some tightness but overall seems to be making some improvement.  Left knee has Lisa [name] history of an open meniscectomy multiple decades ago. States this seems to be doing fairly well as well. We discussed possible bracing which patient did not want to do. Continuing to monitor closely.     Past Medical History:  Diagnosis Date  . Allergy   . Colon polyps    hyperplastic  . Hypercholesteremia   . Hypertension   . Insomnia   . Leukopenia   . Multinodular thyroid   . OA (osteoarthritis)    hips, knees and LS  . Osteopenia   . Psoriasis   .  Vitamin D deficiency    Past Surgical History:  Procedure Laterality Date  . cardiolyte  02-2002  . COLONOSCOPY  12-2006  . FLEXIBLE SIGMOIDOSCOPY  05-2000  . HAND TENDON SURGERY  1979   L 4th finger  . KNEE SURGERY  1969   Left, cartilage  . TONSILLECTOMY AND ADENOIDECTOMY     Social History   Social History  . Marital status: Married    Spouse name: N/Lisa [name]  . Number of children: 2  . 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 Smoking status:  Never Smoker  . Smokeless tobacco: Never Used  . Alcohol use 0.0 oz/week     Comment: socially, 2 drinks twice weekly  . Drug use: No  . Sexual activity: Not Currently    Partners: Male   Other Topics Concern  . Not on file   Social History Narrative   Lives with her husband, who has cognitive deficits.  Son is in [place] (with 2 grandchildren there), daughter and 1 grandson are local.     Allergies  Allergen Reactions  . Amoxicillin Rash  . Erythromycin Rash  . Sulfa Antibiotics Rash   Family History  Problem Relation Age of Onset  . Allergies Mother   . Hypertension Mother   . Vision loss Mother   . Heart disease Mother   . Arthritis Mother   . Arthritis Father   . Heart disease Father 30  . Hearing loss Sister   . Hypertension Sister   . Hyperlipidemia Sister   . Arthritis Sister   . Heart disease Sister 6    massive MI  . Stroke Maternal Grandmother   . Heart disease Maternal Grandfather   . Stroke Maternal Grandfather   . Heart disease Paternal Grandmother   . Stroke Paternal Grandmother   . Cancer Neg Hx   . Arthritis Sister   . Heart disease Sister   . Hyperlipidemia Sister   . Hypertension Sister   . Vision loss Sister     macular degeneration  . Arthritis Sister   . Hypertension Sister   . Hyperlipidemia Sister   . Thyroid disease Sister   . Diabetes Sister 67    diet controlled    Past medical history,  social, surgical and family history all reviewed in electronic medical record.  No pertanent information unless stated regarding to the chief complaint.   Review of Systems: No headache, visual changes, nausea, vomiting, diarrhea, constipation, dizziness, abdominal pain, skin rash, fevers, chills, night sweats, weight loss, swollen lymph nodes, body aches, joint swelling, muscle aches, chest pain, shortness of breath, mood changes.   Objective  There were no vitals taken for this visit.  General: No apparent distress alert and oriented x3 mood and affect normal, dressed appropriately.  HEENT: Pupils equal, extraocular movements intact  Respiratory: Patient's speak in full sentences and does not appear short of breath  Cardiovascular: No lower extremity edema, non tender, no erythema  Skin: Warm dry intact with no signs of infection or rash on extremities or on axial skeleton.  Abdomen: Soft nontender  Neuro: Cranial nerves II through XII are intact, neurovascularly intact in all extremities with 2+ DTRs and 2+ pulses.  Lymph: No lymphadenopathy of posterior or anterior cervical chain or axillae bilaterally.  Gait normal with good balance and coordination.  MSK:  Non tender with full range of motion and good stability and symmetric strength and tone of shoulders, elbows, wrist, hip and ankles bilaterally. Arthritic changes multiple joints Neck: Inspection mild increase in lordosis No palpable stepoffs. Negative Spurling's maneuver. Appear full range of motion but still lacks some extension Grip strength and sensation normal in bilateral hands Strength good C4 to T1 distribution No sensory change to C4 to T1 Negative Hoffman sign bilaterally Reflexes normal Back Exam:  Inspection: increase in kyphosis with poor posture Motion: Flexion 35 deg, Extension 15 deg, Side Bending to 25 deg bilaterally,  Rotation to 25 deg bilaterally  SLR laying: Negative  XSLR laying: Negative  Palpable  tenderness:nontender on exam today. FABER: tightness but negative Sensory  change: Gross sensation intact to all lumbar and sacral dermatomes.  Reflexes: 2+ at both patellar tendons, 2+ at achilles tendons, Babinski's downgoing.  Strength at foot  Plantar-flexion: 5/5 Dorsi-flexion: 5/5 Eversion: 5/5 Inversion: 5/5  Leg strength  Quad: 5/5 Hamstring: 5/5 Hip flexor: 5/5 Hip abductors: 4/5  Gait unremarkable. Mild improvement   Osteopathic findings C2 F RS right T3-5 E R right side bent left inhaled 3rd rib.     Impression and Recommendations:     This case required medical decision making of moderate complexity.      Note: This dictation was prepared with Dragon dictation along with smaller phrase technology. Any transcriptional errors that result from this process are unintentional.

## 2016-04-22 ENCOUNTER — Encounter: Payer: Self-pay | Admitting: Family Medicine

## 2016-04-22 ENCOUNTER — Ambulatory Visit (INDEPENDENT_AMBULATORY_CARE_PROVIDER_SITE_OTHER): Payer: Medicare Other | Admitting: Family Medicine

## 2016-04-22 VITALS — BP 130/80 | HR 77 | Wt 146.0 lb

## 2016-04-22 DIAGNOSIS — M159 Polyosteoarthritis, unspecified: Secondary | ICD-10-CM

## 2016-04-22 DIAGNOSIS — M150 Primary generalized (osteo)arthritis: Secondary | ICD-10-CM | POA: Diagnosis not present

## 2016-04-22 DIAGNOSIS — M9901 Segmental and somatic dysfunction of cervical region: Secondary | ICD-10-CM | POA: Diagnosis not present

## 2016-04-22 DIAGNOSIS — M999 Biomechanical lesion, unspecified: Secondary | ICD-10-CM

## 2016-04-22 DIAGNOSIS — M9908 Segmental and somatic dysfunction of rib cage: Secondary | ICD-10-CM

## 2016-04-22 DIAGNOSIS — M9902 Segmental and somatic dysfunction of thoracic region: Secondary | ICD-10-CM | POA: Diagnosis not present

## 2016-04-22 NOTE — Patient Instructions (Signed)
Kelby FamYoare doing great  I am proud of you Lets not change anything up  Love the monthly massage See me again in 8 weeks.

## 2016-04-22 NOTE — Assessment & Plan Note (Signed)
Doing well with conservative therapy.  Discussed icing, HEP and posture.  I think patient will do well. Continue vitaminD  RTC in 8 weeks.

## 2016-04-22 NOTE — Assessment & Plan Note (Signed)
Decision today to treat with OMT was based on Physical Exam  After verbal consent patient was treated with ME, FPR, ST techniques in Cervical, thoracic and rib areas  Patient tolerated the procedure well with improvement in symptoms  Patient given exercises, stretches and lifestyle modifications  See medications in patient instructions if given  Patient will follow up in 8 weeks

## 2016-04-30 ENCOUNTER — Other Ambulatory Visit: Payer: Medicare Other

## 2016-04-30 DIAGNOSIS — E039 Hypothyroidism, unspecified: Secondary | ICD-10-CM | POA: Diagnosis not present

## 2016-04-30 DIAGNOSIS — E7800 Pure hypercholesterolemia, unspecified: Secondary | ICD-10-CM | POA: Diagnosis not present

## 2016-04-30 DIAGNOSIS — I10 Essential (primary) hypertension: Secondary | ICD-10-CM | POA: Diagnosis not present

## 2016-04-30 DIAGNOSIS — Z5181 Encounter for therapeutic drug level monitoring: Secondary | ICD-10-CM | POA: Diagnosis not present

## 2016-04-30 LAB — CBC WITH DIFFERENTIAL/PLATELET
BASOS PCT: 1 %
Basophils Absolute: 37 cells/uL (ref 0–200)
EOS ABS: 222 {cells}/uL (ref 15–500)
EOS PCT: 6 %
HCT: 38.1 % (ref 35.0–45.0)
Hemoglobin: 12.8 g/dL (ref 11.7–15.5)
LYMPHS PCT: 40 %
Lymphs Abs: 1480 cells/uL (ref 850–3900)
MCH: 30.4 pg (ref 27.0–33.0)
MCHC: 33.6 g/dL (ref 32.0–36.0)
MCV: 90.5 fL (ref 80.0–100.0)
MONOS PCT: 12 %
MPV: 9.4 fL (ref 7.5–12.5)
Monocytes Absolute: 444 cells/uL (ref 200–950)
NEUTROS ABS: 1517 {cells}/uL (ref 1500–7800)
Neutrophils Relative %: 41 %
PLATELETS: 366 10*3/uL (ref 140–400)
RBC: 4.21 MIL/uL (ref 3.80–5.10)
RDW: 13.9 % (ref 11.0–15.0)
WBC: 3.7 10*3/uL — AB (ref 4.0–10.5)

## 2016-04-30 LAB — LIPID PANEL
CHOL/HDL RATIO: 2.7 ratio (ref <=5.0)
CHOLESTEROL: 164 mg/dL (ref 125–200)
HDL: 60 mg/dL (ref >=46)
LDL Cholesterol: 87 mg/dL (ref <130)
Triglycerides: 84 mg/dL (ref <150)
VLDL: 17 mg/dL (ref <30)

## 2016-04-30 LAB — COMPREHENSIVE METABOLIC PANEL
ALBUMIN: 4.3 g/dL (ref 3.6–5.1)
ALT: 19 U/L (ref 6–29)
AST: 19 U/L (ref 10–35)
Alkaline Phosphatase: 53 U/L (ref 33–130)
BUN: 10 mg/dL (ref 7–25)
CALCIUM: 9.9 mg/dL (ref 8.6–10.4)
CHLORIDE: 102 mmol/L (ref 98–110)
CO2: 24 mmol/L (ref 20–31)
CREATININE: 0.69 mg/dL (ref 0.60–0.93)
Glucose, Bld: 90 mg/dL (ref 65–99)
POTASSIUM: 4.6 mmol/L (ref 3.5–5.3)
SODIUM: 136 mmol/L (ref 135–146)
Total Bilirubin: 0.5 mg/dL (ref 0.2–1.2)
Total Protein: 6.6 g/dL (ref 6.1–8.1)

## 2016-04-30 LAB — TSH: TSH: 1.75 m[IU]/L

## 2016-05-04 NOTE — Progress Notes (Signed)
Chief Complaint  Patient presents with  . Medicare Wellness    nonfasting AWV/CPE with pelvic. No concerns. Did have eye exam with Dr.Shapiro.     Lisa [name] is a 75 y.o. female who presents for annual exam, Medicare wellness visit and follow-up on chronic medical conditions.  She has the following concerns:  She has been seeing Dr. Terrilee Files for joint pains (neck, back). Taking turmeric, tart cherry juice (only intermittently) and Vitamin D per Dr. Michaelle Copas recommendation.  She has been seeing him and getting good results regarding her neck and back discomfort.  Anxiety:  She is taking 10mg  of lexapro without side effects, and feels that she is doing well with respect to her moods and anxiety.  She never needed to take alprazolam.  Husband seems to be stable (cognitive deficits), on aricept. She has come to terms with it, and doing well with understanding what their life is like now.  Hypertension follow-up:  She is compliant with taking her avapro daily; dose was increased to 300mg /d in March.  No headaches, dizziness, chest pain, palpitations. Denies edema; denies side effects. BP's are running 125-139/70's (once up to 147/85 when her husband was having a rough day, his BP was very high, caused her some stress). She has some very mild vertigo intermittently, none recently.  Hyperlipidemia follow-up: Patient is reportedly following a low-fat, low cholesterol diet. Compliant with medications and denies medication side effects.   Hypothyroidism: Thyroid biopsy in 2011 was benign; had seen Dr. Talmage Nap in the past, but we are now managing her medications. Denies fatigue, hair, skin, bowel changes. Denies any mood changes. Denies dysphagia or any thyroid concerns. Takes Synthroid on empty stomach, separate from food and other medications.  Insomnia: Well controlled with Seroquel; has been on it for well over 5 years.   Osteopenia: Takes Calcium with D BID, gets regular exercise. She  has been getting weight-bearing exercise through [employer]. Last DEXA 02/2015 with slight decline. T-1.7, but now the FRAX score at hip is >3%. She reports previously taking Actonel for 10 years, stopped it on her own. Doesn't want to restart meds.  Immunization History  Administered Date(s) Administered  . Influenza Whole 05/02/2010  . Influenza, High Dose Seasonal PF 05/16/2014, 04/23/2015  . Pneumococcal Conjugate-13 04/17/2014  . Pneumococcal Polysaccharide-23 09/21/2005  . Td 01/02/2002  . Tdap 01/13/2012  . Zoster 02/26/2011   Last Pap smear: 04/2015 Last mammo 02/2015 Colonoscopy 5/08 (refuses to do again) Dexa 02/2015 T-1.7 L fem neck, slight decline from prior.  Abnormal FRAX at hip, 3.8% Dentist and ophtho regularly  Exercise:  She goes to [employer] 3x/week and chair yoga once weekly. Not walking as much, some short walks daily with her husband.  Vitamin D last checked 04/2015 normal at 40  Other doctors caring for patient include: Dentist: Dr. Lendon Ka Ophtho: Dr. Nile Riggs Cardiologist: Dr. Tresa Endo (no longer sees) Endocrinologist: Dr. Talmage Nap (no longer sees) Sports medicine: Dr. Terrilee Files  Depression, fall and functional status screens were all negative (just slight decrease in hearing noted, some ringing in the right ear)--see epic questionnaires. She is not interested in hearing evaluation--feels like it is mild, and not ready for any hearing aid.  End of Life Discussion:  Patient has a living will and medical power of attorney. She is interested in changing them, due to the change in cognitive status of her husband. She filled out the paperwork she was given last year, but waiting on her daughter's signature.  Past Medical History:  Diagnosis Date  . Allergy   . Colon polyps    hyperplastic  . Hypercholesteremia   . Hypertension   . Insomnia   . Leukopenia   . Multinodular thyroid   . OA (osteoarthritis)    hips, knees and LS  .  Osteopenia   . Psoriasis   . Vitamin D deficiency     Past Surgical History:  Procedure Laterality Date  . cardiolyte  02-2002  . COLONOSCOPY  12-2006  . FLEXIBLE SIGMOIDOSCOPY  05-2000  . HAND TENDON SURGERY  1979   L 4th finger  . KNEE SURGERY  1969   Left, cartilage  . TONSILLECTOMY AND ADENOIDECTOMY      Social History   Social History  . Marital status: Married    Spouse name: N/A  . Number of children: 2  . 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 Smoking status: Never Smoker  . Smokeless tobacco: Never Used  . Alcohol use 0.0 oz/week     Comment: socially, 2 drinks twice weekly  . Drug use: No  . Sexual activity: Not Currently    Partners: Male   Other Topics Concern  . Not on file   Social History Narrative   Lives with her husband, who has cognitive deficits.  [place] is in [place] (with 2 grandchildren there), daughter and 1 grandson are local.      Family History  Problem Relation Age of Onset  . Allergies Mother   . Hypertension Mother   . Vision loss Mother   . Heart disease Mother   . Arthritis Mother   . Arthritis Father   . Heart disease Father 3048  . Hearing loss Sister   . Hypertension Sister   . Hyperlipidemia Sister   . Arthritis Sister   . Heart disease Sister [id number]    massive MI  . Arthritis Sister   . Heart disease Sister   . Hyperlipidemia Sister   . Hypertension Sister   . Arthritis Sister   . Hypertension Sister   . Hyperlipidemia Sister   . Thyroid disease Sister   . Diabetes Sister 3472    diet controlled  . Vision loss Sister     macular degeneration  . Stroke Maternal Grandmother   . Heart disease Maternal Grandfather   . Stroke Maternal Grandfather   . Heart disease Paternal Grandmother   . Stroke Paternal Grandmother   . Cancer Neg Hx     Outpatient Encounter Prescriptions as of 05/05/2016  Medication Sig Note  . aspirin 81 MG  tablet Take 81 mg by mouth daily.     Marland Kitchen. atorvastatin (LIPITOR) 20 MG tablet Take 1 tablet by mouth  daily   . Calcium Carbonate-Vitamin D (CALTRATE 600+D PO) Take 1 tablet by mouth 2 (two) times daily.     . cholecalciferol (VITAMIN D) 1000 units tablet Take 1,000 Units by mouth daily.   Marland Kitchen. escitalopram (LEXAPRO) 10 MG tablet Take 1 tablet by mouth  daily   . irbesartan (AVAPRO) 300 MG tablet Take 1 tablet by mouth  daily   . Multiple Vitamins-Minerals (CENTRUM SILVER PO) Take 1 tablet by mouth daily.     . QUEtiapine (SEROQUEL) 25 MG tablet Take 1 tablet (25 mg total) by mouth at bedtime.   Marland Kitchen. SYNTHROID 50 MCG tablet Take 1 tablet by mouth  daily before breakfast   . fluocinonide (  LIDEX) 0.05 % gel Apply 1 application topically 4 (four) times daily. Reported on 12/12/2015 01/06/2011: Prescribed by Dentist for Canker Sores   No facility-administered encounter medications on file as of 05/05/2016.     Allergies  Allergen Reactions  . Amoxicillin Rash  . Erythromycin Rash  . Sulfa Antibiotics Rash     ROS: The patient denies anorexia, fever, weight changes, headaches, vision changes, ear pain, sore throat, breast concerns, chest pain, palpitations, dizziness, syncope, dyspnea on exertion, cough, swelling, nausea, vomiting, diarrhea, constipation, abdominal pain, melena, hematochezia, indigestion/heartburn, hematuria, incontinence, dysuria, vaginal bleeding, discharge, odor or itch, genital lesions, numbness, tingling, weakness, tremor, suspicious skin lesions, depression, anxiety, abnormal bleeding/bruising, or enlarged lymph nodes. Neck and back pain improved  PHYSICAL EXAM:  BP (!) 144/86 (BP Location: Left Arm, Patient Position: Sitting, Cuff Size: Normal)   Pulse 68   Ht 5\' 2"  (1.575 m)   Wt 146 lb 9.6 oz (66.5 kg)   BMI 26.81 kg/m    General Appearance:  Alert, cooperative, no distress, appears stated age   Head:  Normocephalic, without obvious abnormality, atraumatic   Eyes:   PERRL, conjunctiva/corneas clear, EOM's intact, fundi  benign   Ears:  Normal TM's and external ear canals   Nose:  Nares normal, mucosa normal, no drainage or sinus tenderness   Throat:  Lips and tongue normal; teeth normal; small aphthous ulcer at buccal mucosa on left upper cheek, at level of teeth  Neck:  Supple, no lymphadenopathy; thyroid: no enlargement/tenderness/nodules; no carotid  bruit or JVD   Back:  Spine nontender, no curvature, ROM normal, no CVA tenderness   Lungs:  Clear to auscultation bilaterally without wheezes, rales or ronchi; respirations unlabored   Chest Wall:  No tenderness or deformity   Heart:  Regular rate and rhythm, S1 and S2 normal, no murmur, rub  or gallop   Breast Exam:  No tenderness, masses, or nipple discharge or inversion. No axillary lymphadenopathy   Abdomen:  Soft, non-tender, nondistended, normoactive bowel sounds,  no masses, no hepatosplenomegaly   Genitalia:  Normal external genitalia without lesions. Mild atrophic changes. BUS and vagina normal; no cervical motion tenderness. No abnormal vaginal discharge. Uterus and adnexa not enlarged, nontender, no masses. Polyp noted at left side of cervical os, no other lesions noted.  Pap performed   Rectal:  Normal tone, no masses or tenderness; guaiac negative stool   Extremities:  No clubbing, cyanosis or edema.   Pulses:  2+ and symmetric all extremities   Skin:  Skin color, texture, turgor normal, no rashes or lesions   Lymph nodes:  Cervical, supraclavicular, and axillary nodes normal   Neurologic:  CNII-XII intact, normal strength, sensation and gait; reflexes 2+ and symmetric throughout   Psych: Normal mood, affect, hygiene and grooming    Chemistry      Component Value Date/Time   NA 136 04/30/2016 0813   K 4.6 04/30/2016 0813   CL 102 04/30/2016 0813   CO2 24 04/30/2016 0813   BUN 10 04/30/2016 0813   CREATININE 0.69  04/30/2016 0813      Component Value Date/Time   CALCIUM 9.9 04/30/2016 0813   ALKPHOS 53 04/30/2016 0813   AST 19 04/30/2016 0813   ALT 19 04/30/2016 0813   BILITOT 0.5 04/30/2016 0813     Fasting glucose 90  Lab Results  Component Value Date   CHOL 164 04/30/2016   HDL 60 04/30/2016   LDLCALC 87 04/30/2016   TRIG 84 04/30/2016  CHOLHDL 2.7 04/30/2016   Lab Results  Component Value Date   TSH 1.75 04/30/2016   Lab Results  Component Value Date   WBC 3.7 (L) 04/30/2016   HGB 12.8 04/30/2016   HCT 38.1 04/30/2016   MCV 90.5 04/30/2016   PLT 366 04/30/2016     ASSESSMENT/PLAN:   Medicare annual wellness visit, subsequent - Plan: POCT Urinalysis Dipstick  Essential hypertension, benign - elevated today; okay at home and at recent visit with Dr. Katrinka Blazing - Plan: irbesartan (AVAPRO) 300 MG tablet  Hypothyroidism, unspecified hypothyroidism type - adequately replaced - Plan: SYNTHROID 50 MCG tablet  Pure hypercholesterolemia - lipids at goal - Plan: atorvastatin (LIPITOR) 20 MG tablet  Osteopenia - continue calcium, D, weight-bearing exercise.  Recheck DEXA next year  Generalized anxiety disorder - controlled  Need for prophylactic vaccination and inoculation against influenza - Plan: Flu vaccine HIGH DOSE PF (Fluzone High dose)  Insomnia - controlled - Plan: QUEtiapine (SEROQUEL) 25 MG tablet    Discussed monthly self breast exams and yearly mammograms (past due and reminded to schedule); at least 30 minutes of aerobic activity at least 5 days/week and weight-bearing exercise 2x/week; proper sunscreen use reviewed; healthy diet, including goals of calcium and vitamin D intake and alcohol recommendations (less than or equal to 1 drink/day) reviewed; regular seatbelt use; changing batteries in smoke detectors.  Immunization recommendations discussed--high dose flu shot today.  Colonoscopy recommendations reviewed, UTD. She refuses to have another colonoscopy.  Discussed  Cologard testing (and need for colonoscopy if abnormal) and willing to proceed with this when due, next year.  DEXA due 02/2017.   Pt is full code, full care. Reminded to complete Living Will and healthcare POA and to get [place] copies. MOST form discussed and completed.   Medicare Attestation I have personally reviewed: The patient's medical and social history Their use of alcohol, tobacco or illicit drugs Their current medications and supplements The patient's functional ability including ADLs,fall risks, home safety risks, cognitive, and hearing and visual impairment Diet and physical activities Evidence for depression or mood disorders  The patient's weight, height, and BMI have been recorded in the chart.  I have made referrals, counseling, and provided education to the patient based on review of the above and I have provided the patient with a written personalized care plan for preventive services.     Rhys Lichty A, MD   05/04/2016

## 2016-05-05 ENCOUNTER — Encounter: Payer: Self-pay | Admitting: Family Medicine

## 2016-05-05 ENCOUNTER — Ambulatory Visit (INDEPENDENT_AMBULATORY_CARE_PROVIDER_SITE_OTHER): Payer: Medicare Other | Admitting: Family Medicine

## 2016-05-05 VITALS — BP 158/80 | HR 68 | Ht 62.0 in | Wt 146.6 lb

## 2016-05-05 DIAGNOSIS — Z0000 Encounter for general adult medical examination without abnormal findings: Secondary | ICD-10-CM | POA: Diagnosis not present

## 2016-05-05 DIAGNOSIS — I10 Essential (primary) hypertension: Secondary | ICD-10-CM | POA: Diagnosis not present

## 2016-05-05 DIAGNOSIS — F411 Generalized anxiety disorder: Secondary | ICD-10-CM

## 2016-05-05 DIAGNOSIS — G4700 Insomnia, unspecified: Secondary | ICD-10-CM

## 2016-05-05 DIAGNOSIS — E7800 Pure hypercholesterolemia, unspecified: Secondary | ICD-10-CM

## 2016-05-05 DIAGNOSIS — Z23 Encounter for immunization: Secondary | ICD-10-CM

## 2016-05-05 DIAGNOSIS — E039 Hypothyroidism, unspecified: Secondary | ICD-10-CM

## 2016-05-05 DIAGNOSIS — M8580 Other specified disorders of bone density and structure, unspecified site: Secondary | ICD-10-CM

## 2016-05-05 LAB — POCT URINALYSIS DIPSTICK
BILIRUBIN UA: NEGATIVE
Blood, UA: NEGATIVE
Glucose, UA: NEGATIVE
Ketones, UA: NEGATIVE
LEUKOCYTES UA: NEGATIVE
NITRITE UA: NEGATIVE
PH UA: 7
Protein, UA: NEGATIVE
Spec Grav, UA: 1.015
UROBILINOGEN UA: NEGATIVE

## 2016-05-05 MED ORDER — QUETIAPINE FUMARATE 25 MG PO TABS: 25.0000 mg | ORAL_TABLET | Freq: Every day | ORAL | 3 refills | Status: DC

## 2016-05-05 MED ORDER — SYNTHROID 50 MCG PO TABS: ORAL_TABLET | ORAL | 3 refills | Status: DC

## 2016-05-05 MED ORDER — ATORVASTATIN CALCIUM 20 MG PO TABS: 20.0000 mg | ORAL_TABLET | Freq: Every day | ORAL | 3 refills | Status: DC

## 2016-05-05 MED ORDER — IRBESARTAN 300 MG PO TABS: 300.0000 mg | ORAL_TABLET | Freq: Every day | ORAL | 1 refills | Status: DC

## 2016-05-05 NOTE — Patient Instructions (Signed)
  HEALTH MAINTENANCE RECOMMENDATIONS:  It is recommended that you get at least 30 minutes of aerobic exercise at least 5 days/week (for weight loss, you may need as much as 60-90 minutes). This can be any activity that gets your heart rate up. This can be divided in 10-15 minute intervals if needed, but try and build up your endurance at least once a week.  Weight bearing exercise is also recommended twice weekly.  Eat a healthy diet with lots of vegetables, fruits and fiber.  "Colorful" foods have a lot of vitamins (ie green vegetables, tomatoes, red peppers, etc).  Limit sweet tea, regular sodas and alcoholic beverages, all of which has a lot of calories and sugar.  Up to 1 alcoholic drink daily may be beneficial for women (unless trying to lose weight, watch sugars).  Drink a lot of water.  Calcium recommendations are 1200-1500 mg daily (1500 mg for postmenopausal women or women without ovaries), and vitamin D 1000 IU daily.  This should be obtained from diet and/or supplements (vitamins), and calcium should not be taken all at once, but in divided doses.  Monthly self breast exams and yearly mammograms for women over the age of 75 is recommended.  Sunscreen of at least SPF 30 should be used on all sun-exposed parts of the skin when outside between the hours of 10 am and 4 pm (not just when at beach or pool, but even with exercise, golf, tennis, and yard work!)  Use a sunscreen that says "broad spectrum" so it covers both UVA and UVB rays, and make sure to reapply every 1-2 hours.  Remember to change the batteries in your smoke detectors when changing your clock times in the spring and fall.  Use your seat belt every time you are in a car, and please drive safely and not be distracted with cell phones and texting while driving.   Lisa [name] , Thank you for taking time to come for your Medicare Wellness Visit. I appreciate your ongoing commitment to your health goals. Please review the  following plan we discussed and let me know if I can assist you in the future.   These are the goals we discussed: Goals    None      This is a list of the screening recommended for you and due dates:  Health Maintenance  Topic Date Due  . Flu Shot  03/09/2016  . Colon Cancer Screening  12/22/2016  . Tetanus Vaccine  01/12/2022  . DEXA scan (bone density measurement)  Completed  . Shingles Vaccine  Completed  . Pneumonia vaccines  Completed   You got your flu shot today. We discussed doing Cologard testing next year instead of colonoscopy (for colon cancer screening). Your next bone density test will be due next year (July 2018). Please continue with yearly mammograms (you were due in July of this year--please call and schedule one).  Please remember to get us copies of your living will and healthcare power of attorney at your convenience, once completed.

## 2016-05-23 ENCOUNTER — Other Ambulatory Visit: Payer: Self-pay | Admitting: Family Medicine

## 2016-06-24 ENCOUNTER — Ambulatory Visit (INDEPENDENT_AMBULATORY_CARE_PROVIDER_SITE_OTHER): Payer: Medicare Other | Admitting: Family Medicine

## 2016-06-24 ENCOUNTER — Encounter: Payer: Self-pay | Admitting: Family Medicine

## 2016-06-24 VITALS — BP 130/82 | HR 87 | Ht 62.0 in | Wt 147.0 lb

## 2016-06-24 DIAGNOSIS — M159 Polyosteoarthritis, unspecified: Secondary | ICD-10-CM

## 2016-06-24 DIAGNOSIS — M9988 Other biomechanical lesions of rib cage: Secondary | ICD-10-CM

## 2016-06-24 DIAGNOSIS — M150 Primary generalized (osteo)arthritis: Secondary | ICD-10-CM | POA: Diagnosis not present

## 2016-06-24 DIAGNOSIS — M999 Biomechanical lesion, unspecified: Secondary | ICD-10-CM

## 2016-06-24 NOTE — Assessment & Plan Note (Signed)
Decision today to treat with OMT was based on Physical Exam  After verbal consent patient was treated with ME, FPR, ST techniques in Cervical, thoracic and rib areas  Patient tolerated the procedure well with improvement in symptoms  Patient given exercises, stretches and lifestyle modifications  See medications in patient instructions if given  Patient will follow up in 8-10 weeks

## 2016-06-24 NOTE — Assessment & Plan Note (Signed)
I believe the patient's pain is secondary to some underlying osteophytic arthritis. Patient has though did very well with conservative therapy. Does respond well to osteopathic manipulation. We discussed once again different exercises that could be beneficial. Given suggestions of hip abductor strengthening. We discussed that this can help with core strength and stability. Patient otherwise will see me again in 8-10 weeks for further evaluation and treatment.

## 2016-06-24 NOTE — Progress Notes (Signed)
Tawana ScaleZach Smith D.O. [facility] [address] [place], [place] [address] Phone: [phone] Subjective:    I'm seeing this patient by the request  of:  KNAPP,[name] A, MD  CC: multiple joint pain f/u  HQI:[id number]:Subjective  Lisa AreolaJoan B Heinrich is a 75 y.o. female coming in with complaint of multiple joint pains. Having chronic neck and back pain. Has been told that she's had arthritis previously. Patient elected to try osteopathic manipulation, home exercises and over-the-counter natural supplementations. Patient states overall doing well with monthly massages.  Taking medicines less and being more active. Patient states some mild stiffness in the upper back and neck but nothing severe. Patient states that she is minimally tender daily activities. Denies any radiation pain, any numbness. Patient continues to stay active and as long as she has she seems to do well she states.       Past Medical History:  Diagnosis Date  . Allergy   . Colon polyps    hyperplastic  . Hypercholesteremia   . Hypertension   . Insomnia   . Leukopenia   . Multinodular thyroid   . OA (osteoarthritis)    hips, knees and LS  . Osteopenia   . Psoriasis   . Vitamin D deficiency    Past Surgical History:  Procedure Laterality Date  . cardiolyte  02-2002  . COLONOSCOPY  12-2006  . FLEXIBLE SIGMOIDOSCOPY  05-2000  . HAND TENDON SURGERY  1979   L 4th finger  . KNEE SURGERY  1969   Left, cartilage  . TONSILLECTOMY AND ADENOIDECTOMY     Social History   Social History  . Marital status: Married    Spouse name: N/A  . Number of children: 2  . Years of education: N/A   Occupational History  . 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 Smoking status: Never Smoker  . Smokeless tobacco: Never Used  . Alcohol use 0.0 oz/week     Comment: socially, 2 drinks twice weekly  . Drug use: No  . Sexual activity: Not Currently    Partners: Male   Other Topics Concern    . None   Social History Narrative   Lives with her husband, who has cognitive deficits.  [place] is in [place] ([place] 2 grandchildren there), daughter and 1 grandson are local.     Allergies  Allergen Reactions  . Amoxicillin Rash  . Erythromycin Rash  . Sulfa Antibiotics Rash   Family History  Problem Relation Age of Onset  . Allergies Mother   . Hypertension Mother   . Vision loss Mother   . Heart disease Mother   . Arthritis Mother   . Arthritis Father   . Heart disease Father [id number]  . Hearing loss Sister   . Hypertension Sister   . Hyperlipidemia Sister   . Arthritis Sister   . Heart disease Sister [id number]    massive MI  . Arthritis Sister   . Heart disease Sister   . Hyperlipidemia Sister   . Hypertension Sister   . Arthritis Sister   . Hypertension Sister   . Hyperlipidemia Sister   . Thyroid disease Sister   . Diabetes Sister [id number]    diet controlled  . Vision loss Sister     macular degeneration  . Stroke Maternal Grandmother   . Heart disease Maternal Grandfather   . Stroke Maternal Grandfather   . Heart disease Paternal Grandmother   . Stroke Paternal Grandmother   .  Cancer Neg Hx     Past medical history, social, surgical and family history all reviewed in electronic medical record.  No pertanent information unless stated regarding to the chief complaint.   Review of Systems: No headache, visual changes, nausea, vomiting, diarrhea, constipation, dizziness, abdominal pain, skin rash, fevers, chills, night sweats, weight loss, swollen lymph nodes, hest pain, shortness of breath, mood changes.   Objective  Blood pressure 130/82, pulse 87, height 5\' 2"  (1.575 m), weight 147 lb (66.7 kg), SpO2 96 %.  Systems examined below as of 06/24/16 General: NAD A&O x3 mood, affect normal  HEENT: Pupils equal, extraocular movements intact no nystagmus Respiratory: not short of breath at rest or with speaking Cardiovascular: No lower extremity edema, non tender Skin: Warm dry  intact with no signs of infection or rash on extremities or on axial skeleton. Abdomen: Soft nontender, no masses Neuro: Cranial nerves  intact, neurovascularly intact in all extremities with 2+ DTRs and 2+ pulses. Lymph: No lymphadenopathy appreciated today  Gait normal with good balance and coordination.  MSK: Non tender with full range of motion and good stability and symmetric strength and tone of shoulders, elbows, wrist,  knee hips and ankles bilaterally. Arthritic changes of multiple joints  Osteopathic findings C2 F RS right T3-7 E R right side bent left inhaled 3rd rib.  L2 flexed rotated and side bent right Sacrum left on left   Impression and Recommendations:     This case required medical decision making of moderate complexity.      Note: This dictation was prepared with Dragon dictation along with smaller phrase technology. Any transcriptional errors that result from this process are unintentional.          Terrilee Files D.O. [facility] [address] [place], [place] [address] Phone: [phone] Subjective:    I'm seeing this patient by the request  of:  KNAPP,[name] A, MD  CC: multiple joint pains  BJY:[id number]  Lisa [name] is a 75 y.o. female coming in with complaint of multiple joint pains. States that it seems to be more of the neck, low back, as well as left knee.  Patient states that the low back in the neck seems to be doing relatively well. We discussed icing regimen. We discussed which activities to do when she is been doing fairly regularly. Working on posture. Does respond well to manipulation previously. Patient still has some tightness but overall seems to be making some improvement.  Left knee has a history of an open meniscectomy multiple decades ago. States this seems to be doing fairly well as well. We discussed possible bracing which patient did not want to do. Continuing to monitor closely.     Past Medical History:    Diagnosis Date  . Allergy   . Colon polyps    hyperplastic  . Hypercholesteremia   . Hypertension   . Insomnia   . Leukopenia   . Multinodular thyroid   . OA (osteoarthritis)    hips, knees and LS  . Osteopenia   . Psoriasis   . Vitamin D deficiency    Past Surgical History:  Procedure Laterality Date  . cardiolyte  02-2002  . COLONOSCOPY  12-2006  . FLEXIBLE SIGMOIDOSCOPY  05-2000  . HAND TENDON SURGERY  1979   L 4th finger  . KNEE SURGERY  1969   Left, cartilage  . TONSILLECTOMY AND ADENOIDECTOMY     Social History   Social History  . Marital status: Married  Spouse name: N/A  . Number of children: 2  . Years of education: N/A   Occupational History  . Admin at [employer]   Social History Main Topics  . Smoking status: Never Smoker  . Smokeless tobacco: Never Used  . Alcohol use 0.0 oz/week     Comment: socially, 2 drinks twice weekly  . Drug use: No  . Sexual activity: Not Currently    Partners: Male   Other Topics Concern  . None   Social History Narrative   Lives with her husband, who has cognitive deficits.  Son is in [place] (with 2 grandchildren there), daughter and 1 grandson are local.     Allergies  Allergen Reactions  . Amoxicillin Rash  . Erythromycin Rash  . Sulfa Antibiotics Rash   Family History  Problem Relation Age of Onset  . Allergies Mother   . Hypertension Mother   . Vision loss Mother   . Heart disease Mother   . Arthritis Mother   . Arthritis Father   . Heart disease Father 42  . Hearing loss Sister   . Hypertension Sister   . Hyperlipidemia Sister   . Arthritis Sister   . Heart disease Sister 60    massive MI  . Arthritis Sister   . Heart disease Sister   . Hyperlipidemia Sister   . Hypertension Sister   . Arthritis Sister   . Hypertension Sister   . Hyperlipidemia Sister   . Thyroid disease Sister   . Diabetes Sister 66    diet controlled  . Vision loss Sister     macular  degeneration  . Stroke Maternal Grandmother   . Heart disease Maternal Grandfather   . Stroke Maternal Grandfather   . Heart disease Paternal Grandmother   . Stroke Paternal Grandmother   . Cancer Neg Hx     Past medical history, social, surgical and family history all reviewed in electronic medical record.  No pertanent information unless stated regarding to the chief complaint.   Review of Systems: No headache, visual changes, nausea, vomiting, diarrhea, constipation, dizziness, abdominal pain, skin rash, fevers, chills, night sweats, weight loss, swollen lymph nodes, body aches, joint swelling, muscle aches, chest pain, shortness of breath, mood changes.   Objective  Blood pressure 130/82, pulse 87, height 5\' 2"  (1.575 m), weight 147 lb (66.7 kg), SpO2 96 %.  General: No apparent distress alert and oriented x3 mood and affect normal, dressed appropriately.  HEENT: Pupils equal, extraocular movements intact  Respiratory: Patient's speak in full sentences and does not appear short of breath  Cardiovascular: No lower extremity edema, non tender, no erythema  Skin: Warm dry intact with no signs of infection or rash on extremities or on axial skeleton.  Abdomen: Soft nontender  Neuro: Cranial nerves II through XII are intact, neurovascularly intact in all extremities with 2+ DTRs and 2+ pulses.  Lymph: No lymphadenopathy of posterior or anterior cervical chain or axillae bilaterally.  Gait normal with good balance and coordination.  MSK:  Non tender with full range of motion and good stability and symmetric strength and tone of shoulders, elbows, wrist, hip and ankles bilaterally. Arthritic changes multiple joints Neck: Inspection mild increase in lordosis No palpable stepoffs. Negative Spurling's maneuver. Appear full range of motion but still lacks some extension Grip strength and sensation normal in bilateral hands Strength good C4 to T1 distribution No sensory change to C4 to  T1 Negative Hoffman sign bilaterally Reflexes normal Back Exam:  Inspection: increase in kyphosis with poor posture Motion: Flexion 35 deg, Extension 15 deg, Side Bending to 25 deg bilaterally,  Rotation to 25 deg bilaterally  SLR laying: Negative  XSLR laying: Negative  Palpable tenderness:nontender on exam today. FABER: tightness but negative Sensory change: Gross sensation intact to all lumbar and sacral dermatomes.  Reflexes: 2+ at both patellar tendons, 2+ at achilles tendons, Babinski's downgoing.  Strength at foot  Plantar-flexion: 5/5 Dorsi-flexion: 5/5 Eversion: 5/5 Inversion: 5/5  Leg strength  Quad: 5/5 Hamstring: 5/5 Hip flexor: 5/5 Hip abductors: 4/5  Gait unremarkable. Mild improvement   Osteopathic findings C2 F RS right T3-5 E R right side bent left inhaled 3rd rib.     Impression and Recommendations:     This case required medical decision making of moderate complexity.      Note: This dictation was prepared with Dragon dictation along with smaller phrase technology. Any transcriptional errors that result from this process are unintentional.

## 2016-06-24 NOTE — Patient Instructions (Signed)
Good to see you  Ice is your friend  I am impressed  Continue what you are doing.  See me again in 10 weeks Happy holidays!   Arnica lotion for your daughter and monitor for double vision or clear dripping from the nose.

## 2016-07-27 ENCOUNTER — Other Ambulatory Visit: Payer: Self-pay | Admitting: Family Medicine

## 2016-07-27 DIAGNOSIS — I10 Essential (primary) hypertension: Secondary | ICD-10-CM

## 2016-08-09 DIAGNOSIS — B029 Zoster without complications: Secondary | ICD-10-CM

## 2016-08-09 HISTORY — DX: Zoster without complications: B02.9

## 2016-08-28 DIAGNOSIS — I10 Essential (primary) hypertension: Secondary | ICD-10-CM | POA: Diagnosis not present

## 2016-08-28 DIAGNOSIS — B029 Zoster without complications: Secondary | ICD-10-CM | POA: Diagnosis not present

## 2016-08-30 ENCOUNTER — Ambulatory Visit (INDEPENDENT_AMBULATORY_CARE_PROVIDER_SITE_OTHER): Payer: Medicare Other | Admitting: Family Medicine

## 2016-08-30 ENCOUNTER — Encounter: Payer: Self-pay | Admitting: Family Medicine

## 2016-08-30 VITALS — BP 124/78 | HR 84 | Ht 61.5 in | Wt 143.4 lb

## 2016-08-30 DIAGNOSIS — B029 Zoster without complications: Secondary | ICD-10-CM | POA: Diagnosis not present

## 2016-08-30 NOTE — Progress Notes (Signed)
Chief Complaint  Patient presents with  . Follow-up    diagnosed with shingles and is near her eye-wanted to follow up.    She went to [facility] on 1/20 and was diagnosed with shingles. She was started on acyclovir 800mg  5x/d for a week. She was advised to f/u with PCP within 2 days  Symptoms started 1/18, pain got worse on 1/19, went to [facility] on the 20th. Rash is above her right eyebrow, forehead, discomfort in scalp, and also rash on nose. She takes advil prn, which seems to help.   She has had prior zostavax immunization. She has developed some diarrhea, and slight dizziness, which she relates to the medication.  Eyelid feels a little itchy.  Denies blurred vision.  Has some eye tearing, but she sometimes gets this with colds, and has some congestion.  She had some eye pain earlier, when the rash was more painful. She feels like the medication is helping--less painful.  The lesions on her nose seem a little more prominent, and getting more in her scalp--a slight itchy/painful sensation persists in the right scalp.  She has URI symptoms--some cough, generalized malaise.  Not having much runny nose, sneezing like she usually gets with colds. Denies fevers.  Taking acyclovir as directed--She took 2 on Sat, 5 yesterday, and 2 so far today  PMH, PSH, SH reviewed  Outpatient Encounter Prescriptions as of 08/30/2016  Medication Sig Note  . acyclovir (ZOVIRAX) 800 MG tablet Take 800 mg by mouth 5 (five) times daily.   Marland Kitchen aspirin 81 MG tablet Take 81 mg by mouth daily.     Marland Kitchen atorvastatin (LIPITOR) 20 MG tablet Take 1 tablet (20 mg total) by mouth daily.   . Calcium Carbonate-Vitamin D (CALTRATE 600+D PO) Take 1 tablet by mouth 2 (two) times daily.     . cholecalciferol (VITAMIN D) 1000 units tablet Take 1,000 Units by mouth daily.   Marland Kitchen escitalopram (LEXAPRO) 10 MG tablet TAKE 1 TABLET BY MOUTH  DAILY   . irbesartan (AVAPRO) 300 MG tablet TAKE 1 TABLET BY MOUTH  DAILY   . Multiple  Vitamins-Minerals (CENTRUM SILVER PO) Take 1 tablet by mouth daily.     . QUEtiapine (SEROQUEL) 25 MG tablet Take 1 tablet (25 mg total) by mouth at bedtime.   Marland Kitchen SYNTHROID 50 MCG tablet Take 1 tablet by mouth  daily before breakfast   . fluocinonide (LIDEX) 0.05 % gel Apply 1 application topically 4 (four) times daily. Reported on 12/12/2015 01/06/2011: Prescribed by Dentist for Canker Sores   No facility-administered encounter medications on file as of 08/30/2016.    Allergies  Allergen Reactions  . Amoxicillin Rash  . Erythromycin Rash  . Sulfa Antibiotics Rash   ROS: no fever, chills, headaches; slight intermittent dizziness, and some loose stools.  No vision changes.  Eye discomfort/pain has improved, some tearing persists.  No nausea, vomiting, abdominal pain, chest pain, shortness of breath or other neurologic complaints.  PHYSICAL EXAM:  BP 124/78 (BP Location: Left Arm, Patient Position: Sitting, Cuff Size: Normal)   Pulse 84   Ht 5' 1.5" (1.562 m)   Wt 143 lb 6.4 oz (65 kg)   BMI 26.66 kg/m   Well appearing, talkative female in no distress. HEENT: Cluster of scabbed lesions and pustules on an erythematous base above the right eye, extending to the right temple.  She also has some erythematous papules on the central portion of the nose.  Right temporal-parietal scalp, where she complains of some  itching discomfort, appears normal without lesions. Conjunctiva and sclera are clear, no crusting. PERRL, EOMI.  Sinuses nontender. Nasal mucosa is mildly edematous, no erythema, no purulence. OP is clear. TM's and EAC's normal Neck: no lymphadenopathy or mass Heart: regular rate and rhythm Lungs: clear bilaterally Abdomen: soft, nontender. normal bowel sounds. Extremities: no edema Psych: talkative, normal mood, affect, hygiene and grooming  ASSESSMENT/PLAN:  Herpes zoster without complication - at risk for ocular complications given the nerve affected. F/U with optho recommended.  Complete course of zovirax--call to change to Valtrex if not tolerating.  She is fortunate to not have significant pain, like due to prior zostavax vaccination. Continue Advil prn. Discussed concern and reasons to f/u with ophtho this week.  Discussed  Change to Valtrex vs completing course of acyclovir (she has been compliant with taking it as directed, mild side effects). She opts to continue the acyclovir for now, and will contact us if not tolerating for change.  She was advised to f/u with eye doctor given the involvement of the tip of her nose, and also slight eye symptoms (very mild).    Complete the course of acyclovir as prescribed (verify that your dose is 400mg  per tablet, if instructed to take 2 at a time, for a total dose of 800mg  taken 5 times daily).  If your tablets are 800mg , you should only be taken one at a time, 5 times/day. Try and limit/avoid dairy, and use probiotics to help with the loose stools. If they become worse, and you'd like to try switching to valtrex (valacyclovir--taken 1000mg  three times daily), call and let us know.  I think you should set up a visit with your eye doctor in the next day or so for evaluation, given that you have very slight eye symptoms, and involvement of the shingles with the nose (indicating your eye may be involved).  We will discuss the new shingles vaccine in more detail at another routine visit.   I do think that your case of shingles is luckily fairly mild, due to you having been vaccinated with zostavax in the past.

## 2016-08-30 NOTE — Patient Instructions (Signed)
Complete the course of acyclovir as prescribed (verify that your dose is 400mg  per tablet, if instructed to take 2 at a time, for a total dose of 800mg  taken 5 times daily).  If your tablets are 800mg , you should only be taken one at a time, 5 times/day. Try and limit/avoid dairy, and use probiotics to help with the loose stools. If they become worse, and you'd like to try switching to valtrex (valacyclovir--taken 1000mg  three times daily), call and let us know.  I think you should set up a visit with your eye doctor in the next day or so for evaluation, given that you have very slight eye symptoms, and involvement of the shingles with the nose (indicating your eye may be involved).  We will discuss the new shingles vaccine in more detail at another routine visit.   I do think that your case of shingles is luckily fairly mild, due to you having been vaccinated with zostavax in the past.    Shingles Shingles, which is also known as herpes zoster, is an infection that causes a painful skin rash and fluid-filled blisters. Shingles is not related to genital herpes, which is a sexually transmitted infection. Shingles only develops in people who:  Have had chickenpox.  Have received the chickenpox vaccine. (This is rare.) What are the causes? Shingles is caused by varicella-zoster virus (VZV). This is the same virus that causes chickenpox. After exposure to VZV, the virus stays in the body in an inactive (dormant) state. Shingles develops if the virus reactivates. This can happen many years after the initial exposure to VZV. It is not known what causes this virus to reactivate. What increases the risk? People who have had chickenpox or received the chickenpox vaccine are at risk for shingles. Infection is more common in people who:  Are older than age 76.  Have a weakened defense (immune) system, such as those with HIV, AIDS, or cancer.  Are taking medicines that weaken the immune system, such  as transplant medicines.  Are under great stress. What are the signs or symptoms? Early symptoms of this condition include itching, tingling, and pain in an area on your skin. Pain may be described as burning, stabbing, or throbbing. A few days or weeks after symptoms start, a painful red rash appears, usually on one side of the body in a bandlike or beltlike pattern. The rash eventually turns into fluid-filled blisters that break open, scab over, and dry up in about 2-3 weeks. At any time during the infection, you may also develop:  A fever.  Chills.  A headache.  An upset stomach. How is this diagnosed? This condition is diagnosed with a skin exam. Sometimes, skin or fluid samples are taken from the blisters before a diagnosis is made. These samples are examined under a microscope or sent to a lab for testing. How is this treated? There is no specific cure for this condition. Your health care provider will probably prescribe medicines to help you manage pain, recover more quickly, and avoid long-term problems. Medicines may include:  Antiviral drugs.  Anti-inflammatory drugs.  Pain medicines. If the area involved is on your face, you may be referred to a specialist, such as an eye doctor (ophthalmologist) or an ear, nose, and throat (ENT) doctor to help you avoid eye problems, chronic pain, or disability. Follow these instructions at home: Medicines  Take medicines only as directed by your health care provider.  Apply an anti-itch or numbing cream to the affected  area as directed by your health care provider. Blister and Rash Care  Take a cool bath or apply cool compresses to the area of the rash or blisters as directed by your health care provider. This may help with pain and itching.  Keep your rash covered with a loose bandage (dressing). Wear loose-fitting clothing to help ease the pain of material rubbing against the rash.  Keep your rash and blisters clean with mild soap  and cool water or as directed by your health care provider.  Check your rash every day for signs of infection. These include redness, swelling, and pain that lasts or increases.  Do not pick your blisters.  Do not scratch your rash. General instructions  Rest as directed by your health care provider.  Keep all follow-up visits as directed by your health care provider. This is important.  Until your blisters scab over, your infection can cause chickenpox in people who have never had it or been vaccinated against it. To prevent this from happening, avoid contact with other people, especially:  Babies.  Pregnant women.  Children who have eczema.  Elderly people who have transplants.  People who have chronic illnesses, such as leukemia or AIDS. Contact a health care provider if:  Your pain is not relieved with prescribed medicines.  Your pain does not get better after the rash heals.  Your rash looks infected. Signs of infection include redness, swelling, and pain that lasts or increases. Get help right away if:  The rash is on your face or nose.  You have facial pain, pain around your eye area, or loss of feeling on one side of your face.  You have ear pain or you have ringing in your ear.  You have loss of taste.  Your condition gets worse. This information is not intended to replace advice given to you by your health care provider. Make sure you discuss any questions you have with your health care provider. Document Released: 07/26/2005 Document Revised: 03/21/2016 Document Reviewed: 06/06/2014 Elsevier Interactive Patient Education  2017 [employer].

## 2016-08-31 ENCOUNTER — Encounter: Payer: Self-pay | Admitting: Family Medicine

## 2016-09-01 DIAGNOSIS — B029 Zoster without complications: Secondary | ICD-10-CM | POA: Diagnosis not present

## 2016-09-02 ENCOUNTER — Ambulatory Visit: Payer: Medicare Other | Admitting: Family Medicine

## 2016-09-29 ENCOUNTER — Encounter: Payer: Self-pay | Admitting: Family Medicine

## 2016-09-29 ENCOUNTER — Ambulatory Visit (INDEPENDENT_AMBULATORY_CARE_PROVIDER_SITE_OTHER): Payer: Medicare Other | Admitting: Family Medicine

## 2016-09-29 VITALS — BP 122/82 | HR 68 | Ht 62.5 in | Wt 143.0 lb

## 2016-09-29 DIAGNOSIS — M159 Polyosteoarthritis, unspecified: Secondary | ICD-10-CM

## 2016-09-29 DIAGNOSIS — M9982 Other biomechanical lesions of thoracic region: Secondary | ICD-10-CM

## 2016-09-29 DIAGNOSIS — M999 Biomechanical lesion, unspecified: Secondary | ICD-10-CM

## 2016-09-29 DIAGNOSIS — M150 Primary generalized (osteo)arthritis: Secondary | ICD-10-CM | POA: Diagnosis not present

## 2016-09-29 NOTE — Patient Instructions (Signed)
Good to see you  You are doing great  Ice is your friend Decrease my exercises 3 times a week.  See me again in 10-12 weeks

## 2016-09-29 NOTE — Progress Notes (Signed)
[name] [address] [place], [place] [address] Phone: [phone] Subjective:    I'm seeing this patient by the request  of:  KNAPP,[name] A, MD  CC: multiple joint pain f/u  [id number]  Lisa [name] is a 76 y.o. female coming in with complaint of multiple joint pains. Having chronic neck and back pain. Patient has been doing relatively well overall. Patient recently did have 2 illnesses including shingles on her face. Patient states over the course last week has started feeling somewhat better. Patient states his long as she is active and gets her massages from time to time she seems to do relatively well. Continues to try to stay active on a regular basis.       Past Medical History:  Diagnosis Date  . Allergy   . Colon polyps    hyperplastic  . Hypercholesteremia   . Hypertension   . Insomnia   . Leukopenia   . Multinodular thyroid   . OA (osteoarthritis)    hips, knees and LS  . Osteopenia   . Psoriasis   . Shingles 08/2016   left forehead  . Vitamin D deficiency    Past Surgical History:  Procedure Laterality Date  . cardiolyte  02-2002  . COLONOSCOPY  12-2006  . FLEXIBLE SIGMOIDOSCOPY  05-2000  . HAND TENDON SURGERY  1979   L 4th finger  . KNEE SURGERY  1969   Left, cartilage  . TONSILLECTOMY AND ADENOIDECTOMY     Social History   Social History  . Marital status: Married    Spouse name: N/A  . Number of children: 2  . Years of education: N/A   Occupational History  . Admin at [employer]   Social History Main Topics  . Smoking status: Never Smoker  . Smokeless tobacco: Never Used  . Alcohol use 0.0 oz/week     Comment: socially, 2 drinks twice weekly  . Drug use: No  . Sexual activity: Not Currently    Partners: Male   Other Topics Concern  . None   Social History Narrative   Lives with her husband, who has cognitive deficits.  Son is in [place] (with 2 grandchildren there),  daughter and 1 grandson are local.     Allergies  Allergen Reactions  . Amoxicillin Rash  . Erythromycin Rash  . Sulfa Antibiotics Rash   Family History  Problem Relation Age of Onset  . Allergies Mother   . Hypertension Mother   . Vision loss Mother   . Heart disease Mother   . Arthritis Mother   . Arthritis Father   . Heart disease Father 19  . Hearing loss Sister   . Hypertension Sister   . Hyperlipidemia Sister   . Arthritis Sister   . Heart disease Sister 56    massive MI  . Arthritis Sister   . Heart disease Sister   . Hyperlipidemia Sister   . Hypertension Sister   . Arthritis Sister   . Hypertension Sister   . Hyperlipidemia Sister   . Thyroid disease Sister   . Diabetes Sister 39    diet controlled  . Vision loss Sister     macular degeneration  . Stroke Maternal Grandmother   . Heart disease Maternal Grandfather   . Stroke Maternal Grandfather   . Heart disease Paternal Grandmother   . Stroke Paternal Grandmother   . Cancer Neg Hx     Past medical history, social, surgical  and family history all reviewed in electronic medical record.  No pertanent information unless stated regarding to the chief complaint.   Review of Systems: No headache, visual changes, nausea, vomiting, diarrhea, constipation, dizziness, abdominal pain, skin rash, fevers, chills, night sweats, weight loss, swollen lymph nodes, body aches, joint swelling, muscle aches, chest pain, shortness of breath, mood changes.    Objective  Blood pressure 122/82, pulse 68, height 5' 2.5" (1.588 m), weight 143 lb (64.9 kg).  Systems examined below as of 09/29/16 General: NAD A&O x3 mood, affect normal  HEENT: Pupils equal, extraocular movements intact no nystagmus Respiratory: not short of breath at rest or with speaking Cardiovascular: No lower extremity edema, non tender Skin: Warm dry intact with no signs of infection or rash on extremities or on axial skeleton. Abdomen: Soft nontender, no  masses Neuro: Cranial nerves  intact, neurovascularly intact in all extremities with 2+ DTRs and 2+ pulses. Lymph: No lymphadenopathy appreciated today  Gait normal with good balance and coordination.  MSK: Non tender with full range of motion and good stability and symmetric strength and tone of shoulders, elbows, wrist,  knee hips and ankles bilaterally. Arthritic changes of multiple joints  Osteopathic findings Cervical C3 flexed rotated and side bent right C6 flexed rotated and side bent left T3-7 extended rotated left and side bent right  Sacrum right on right    Impression and Recommendations:     This case required medical decision making of moderate complexity.      Note: This dictation was prepared with Dragon dictation along with smaller phrase technology. Any transcriptional errors that result from this process are unintentional.          Terrilee Files D.O. [facility] [address] [place], [place] [address] Phone: [phone] Subjective:    I'm seeing this patient by the request  of:  KNAPP,[name] A, MD  CC: multiple joint pains  [id number]  Lisa [name] is a 76 y.o. female coming in with complaint of multiple joint pains. States that it seems to be more of the neck, low back, as well as left knee.  Patient states that the low back in the neck seems to be doing relatively well. We discussed icing regimen. We discussed which activities to do when she is been doing fairly regularly. Working on posture. Does respond well to manipulation previously. Patient still has some tightness but overall seems to be making some improvement.  Left knee has a history of an open meniscectomy multiple decades ago. States this seems to be doing fairly well as well. We discussed possible bracing which patient did not want to do. Continuing to monitor closely.     Past Medical History:  Diagnosis Date  . Allergy   . Colon polyps    hyperplastic  .  Hypercholesteremia   . Hypertension   . Insomnia   . Leukopenia   . Multinodular thyroid   . OA (osteoarthritis)    hips, knees and LS  . Osteopenia   . Psoriasis   . Shingles 08/2016   left forehead  . Vitamin D deficiency    Past Surgical History:  Procedure Laterality Date  . cardiolyte  02-2002  . COLONOSCOPY  12-2006  . FLEXIBLE SIGMOIDOSCOPY  05-2000  . HAND TENDON SURGERY  1979   L 4th finger  . KNEE SURGERY  1969   Left, cartilage  . TONSILLECTOMY AND ADENOIDECTOMY     Social History   Social History  . Marital  status: Married    Spouse name: N/A  . Number of children: 2  . 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 Smoking status: Never Smoker  . Smokeless tobacco: Never Used  . Alcohol use 0.0 oz/week     Comment: socially, 2 drinks twice weekly  . Drug use: No  . Sexual activity: Not Currently    Partners: Male   Other Topics Concern  . None   Social History Narrative   Lives with her husband, who has cognitive deficits.  Son is in [place] (with 2 grandchildren there), daughter and 1 grandson are local.     Allergies  Allergen Reactions  . Amoxicillin Rash  . Erythromycin Rash  . Sulfa Antibiotics Rash   Family History  Problem Relation Age of Onset  . Allergies Mother   . Hypertension Mother   . Vision loss Mother   . Heart disease Mother   . Arthritis Mother   . Arthritis Father   . Heart disease Father 81  . Hearing loss Sister   . Hypertension Sister   . Hyperlipidemia Sister   . Arthritis Sister   . Heart disease Sister 44    massive MI  . Arthritis Sister   . Heart disease Sister   . Hyperlipidemia Sister   . Hypertension Sister   . Arthritis Sister   . Hypertension Sister   . Hyperlipidemia Sister   . Thyroid disease Sister   . Diabetes Sister 59    diet controlled  . Vision loss Sister     macular degeneration  . Stroke Maternal  Grandmother   . Heart disease Maternal Grandfather   . Stroke Maternal Grandfather   . Heart disease Paternal Grandmother   . Stroke Paternal Grandmother   . Cancer Neg Hx     Past medical history, social, surgical and family history all reviewed in electronic medical record.  No pertanent information unless stated regarding to the chief complaint.   Review of Systems: No headache, visual changes, nausea, vomiting, diarrhea, constipation, dizziness, abdominal pain, skin rash, fevers, chills, night sweats, weight loss, swollen lymph nodes, body aches, joint swelling, muscle aches, chest pain, shortness of breath, mood changes.   Objective  Blood pressure 122/82, pulse 68, height 5' 2.5" (1.588 m), weight 143 lb (64.9 kg).  General: No apparent distress alert and oriented x3 mood and affect normal, dressed appropriately.  HEENT: Pupils equal, extraocular movements intact  Respiratory: Patient's speak in full sentences and does not appear short of breath  Cardiovascular: No lower extremity edema, non tender, no erythema  Skin: Warm dry intact with no signs of infection or rash on extremities or on axial skeleton.  Abdomen: Soft nontender  Neuro: Cranial nerves II through XII are intact, neurovascularly intact in all extremities with 2+ DTRs and 2+ pulses.  Lymph: No lymphadenopathy of posterior or anterior cervical chain or axillae bilaterally.  Gait normal with good balance and coordination.  MSK:  Non tender with full range of motion and good stability and symmetric strength and tone of shoulders, elbows, wrist, hip and ankles bilaterally. Arthritic changes multiple joints Neck: Inspection mild increase in lordosis No palpable stepoffs. Negative Spurling's maneuver. Appear full range of motion but still lacks some extension Grip strength and sensation normal in bilateral hands Strength good C4 to T1 distribution No sensory change to C4 to T1 Negative Hoffman sign bilaterally Reflexes  normal Back  Exam:  Inspection: increase in kyphosis with poor posture Motion: Flexion 35 deg, Extension 15 deg, Side Bending to 25 deg bilaterally,  Rotation to 25 deg bilaterally  SLR laying: Negative  XSLR laying: Negative  Palpable tenderness:nontender on exam today. FABER: tightness but negative Sensory change: Gross sensation intact to all lumbar and sacral dermatomes.  Reflexes: 2+ at both patellar tendons, 2+ at achilles tendons, Babinski's downgoing.  Strength at foot  Plantar-flexion: 5/5 Dorsi-flexion: 5/5 Eversion: 5/5 Inversion: 5/5  Leg strength  Quad: 5/5 Hamstring: 5/5 Hip flexor: 5/5 Hip abductors: 4/5  Gait unremarkable. Mild improvement   Osteopathic findings C2 F RS right T3-5 E R right side bent left inhaled 3rd rib.      Impression and Recommendations:     This case required medical decision making of moderate complexity.      Note: This dictation was prepared with Dragon dictation along with smaller phrase technology. Any transcriptional errors that result from this process are unintentional.

## 2016-09-29 NOTE — Assessment & Plan Note (Signed)
Decision today to treat with OMT was based on Physical Exam  After verbal consent patient was treated with HVLA, ME, FPR techniques in cervical, thoracic, rib and sacral areas  Patient tolerated the procedure well with improvement in symptoms  Patient given exercises, stretches and lifestyle modifications  See medications in patient instructions if given  Patient will follow up in 12 weeks

## 2016-09-29 NOTE — Assessment & Plan Note (Signed)
Encouraged HEP Monitor for symptoms.  Doing well overall.  icing regimen. Patient come back and see me again in 12 weeks. Encourage strengthening exercises.

## 2016-10-04 ENCOUNTER — Other Ambulatory Visit: Payer: Self-pay | Admitting: Family Medicine

## 2016-10-05 ENCOUNTER — Telehealth: Payer: Self-pay

## 2016-10-05 DIAGNOSIS — I10 Essential (primary) hypertension: Secondary | ICD-10-CM

## 2016-10-05 MED ORDER — IRBESARTAN 300 MG PO TABS: 300.0000 mg | ORAL_TABLET | Freq: Every day | ORAL | 0 refills | Status: DC

## 2016-10-05 NOTE — Telephone Encounter (Signed)
Pt needs refill of Avapro called to [facility]. Trixie Rude/RLB

## 2016-10-05 NOTE — Telephone Encounter (Signed)
done

## 2016-11-04 ENCOUNTER — Encounter: Payer: Medicare Other | Admitting: Family Medicine

## 2016-11-09 NOTE — Progress Notes (Signed)
Chief Complaint  Patient presents with  . Hypertension    med check.    She was last seen 3 months ago, when diagnosed with shingles, affecting R upper face.  She has some intermittent tingling at her right upper forehead. The pain and itching has resolved.  Hypertension follow-up: She is compliant with taking her avapro daily. Denies headaches, dizziness, chest pain, palpitations. Denies edema; denies side effects. BP's are running 122-145/70-81. Higher ones may have been related to stress from flooded bathroom recently.  She has some very mild vertigo intermittently, none since her last visit.  Hyperlipidemia follow-up: Patient is reportedly following a low-fat, low cholesterol diet. Compliant with medications and denies medication side effects.  Lab Results  Component Value Date   CHOL 164 04/30/2016   HDL 60 04/30/2016   LDLCALC 87 04/30/2016   TRIG 84 04/30/2016   CHOLHDL 2.7 04/30/2016    Hypothyroidism: Thyroid biopsy in 2011 was benign; had seen Dr. Chalmers Cater in the past, but we are now managing her medications. Denies fatigue, hair, skin, bowel changes. Denies any mood changes. Denies dysphagia or any thyroid concerns. Takes Synthroid on empty stomach, separate from food and other medications. Lab Results  Component Value Date   TSH 1.75 04/30/2016   Insomnia: Well controlled with Seroquel; has been on it for well over 5 years.   Anxiety: She is taking 52m of lexapro without side effects, and feels that she is doing well with respect to her moods and anxiety. She never needed to take alprazolam. Husband has cognitive deficits, on aricept. She has come to terms with it, and doing well with understanding what their life is like now. She gets downtime in the evenings when he goes to bed early. Husband is going to [employer] [employer]  [facility]  Feels a little more empowered.  PMH, PSH, SH  reviewed  Outpatient Encounter Prescriptions as of 11/10/2016  Medication Sig Note  . aspirin 81 MG tablet Take 81 mg by mouth daily.     .Marland Kitchenatorvastatin (LIPITOR) 20 MG tablet Take 1 tablet (20 mg total) by mouth daily.   . Calcium Carbonate-Vitamin D (CALTRATE 600+D PO) Take 1 tablet by mouth 2 (two) times daily.     . cholecalciferol (VITAMIN D) 1000 units tablet Take 1,000 Units by mouth daily.   .Marland Kitchenescitalopram (LEXAPRO) 10 MG tablet TAKE 1 TABLET BY MOUTH  DAILY   . irbesartan (AVAPRO) 300 MG tablet Take 1 tablet (300 mg total) by mouth daily.   . Multiple Vitamins-Minerals (CENTRUM SILVER PO) Take 1 tablet by mouth daily.     . QUEtiapine (SEROQUEL) 25 MG tablet Take 1 tablet (25 mg total) by mouth at bedtime.   .Marland KitchenSYNTHROID 50 MCG tablet Take 1 tablet by mouth  daily before breakfast   . fluocinonide (LIDEX) 0.05 % gel Apply 1 application topically 4 (four) times daily. Reported on 12/12/2015 01/06/2011: Prescribed by Dentist for Canker Sores   No facility-administered encounter medications on file as of 11/10/2016.    Allergies  Allergen Reactions  . Amoxicillin Rash  . Erythromycin Rash  . Sulfa Antibiotics Rash    ROS:  Denies fevers, chills, URI or allergy symptoms (haven't started to flare yet, started taking anthistamines last week). Denies nausea, vomiting, bowel changes, bleeding, bruising, rashes, mood changes. No chest pain, palpitations, edema, shortness of breath or other complaints.  Slight intermittent tingling at right scalp/forehead where shingles was,  no pain.   PHYSICAL EXAM:  BP 126/70 (BP Location: Left Arm, Patient Position: Sitting, Cuff Size: Normal)   Pulse 64   Ht 5' 2.5" (1.588 m)   Wt 143 lb 6.4 oz (65 kg)   BMI 25.81 kg/m   Talkative, pleasant female, in good spirits HEENT: PERRL, EOMI, conjunctiva and sclera clear. OP clear Neck: no lymphadenopathy, thyromegaly or carotid bruit Heart: regular rate and rhythm without murmur Lungs: clear  bilaterally Back: no CVA tenderness Abdomen: soft, nontender, no organomegaly or mass Extremities: no edema, 2+ pulses Neuro: alert and oriented, cranial nerves intact, normal gait Psych: normal mood, affect, hygiene and grooming Skin: normal turgor, no rash/lesions  ASSESSMENT/PLAN  Essential hypertension, benign - well controlled  Pure hypercholesterolemia - at goal per last check; tolerating without SE's. Continue statin, recheck at CPE  Generalized anxiety disorder - controlled with current medication. Support group has been helpful  Insomnia, unspecified type - doing well with seroquel qHS.  Hypothyroidism, unspecified type - euthyroid by history; continue current dose. Call for sooner TSH if sx develop   Return in October for AWV/CPE as scheduled c-met, lipid, CBC, TSH prior to visit   shingrix at some point--given info.  No rush, given recent infection. She plans to get her husband the vaccine soon.

## 2016-11-10 ENCOUNTER — Ambulatory Visit (INDEPENDENT_AMBULATORY_CARE_PROVIDER_SITE_OTHER): Payer: Medicare Other | Admitting: Family Medicine

## 2016-11-10 ENCOUNTER — Encounter: Payer: Self-pay | Admitting: Family Medicine

## 2016-11-10 VITALS — BP 126/70 | HR 64 | Ht 62.5 in | Wt 143.4 lb

## 2016-11-10 DIAGNOSIS — E039 Hypothyroidism, unspecified: Secondary | ICD-10-CM

## 2016-11-10 DIAGNOSIS — E7800 Pure hypercholesterolemia, unspecified: Secondary | ICD-10-CM | POA: Diagnosis not present

## 2016-11-10 DIAGNOSIS — G4700 Insomnia, unspecified: Secondary | ICD-10-CM

## 2016-11-10 DIAGNOSIS — Z5181 Encounter for therapeutic drug level monitoring: Secondary | ICD-10-CM

## 2016-11-10 DIAGNOSIS — F411 Generalized anxiety disorder: Secondary | ICD-10-CM | POA: Diagnosis not present

## 2016-11-10 DIAGNOSIS — I10 Essential (primary) hypertension: Secondary | ICD-10-CM | POA: Diagnosis not present

## 2016-11-10 NOTE — Patient Instructions (Signed)
Continue your current medications. Your blood pressure was excellent, 126/70. Return in October as scheduled for your wellness exam--contact us sooner if you are having any thyroid symptoms, or any problems.   I recommend getting the new shingles vaccine (Shingrix) --there is no rush, since your antibody levels are high from your recent episode. You will need to check with your insurance to see if it is covered, and if covered by Medicare Part D, you need to get from the pharmacy rather than our office.  It is a series of 2 injections, spaced 2 months apart.  The shredding fundraiser at [facility] is Sunday 4/29 (not sure of exact time, either 9:30-12:30 or 10-1.

## 2016-11-11 ENCOUNTER — Encounter: Payer: Medicare Other | Admitting: Family Medicine

## 2016-11-18 ENCOUNTER — Telehealth: Payer: Self-pay | Admitting: Family Medicine

## 2016-11-18 MED ORDER — ALPRAZOLAM 0.25 MG PO TABS: 0.2500 mg | ORAL_TABLET | Freq: Three times a day (TID) | ORAL | 0 refills | Status: DC | PRN

## 2016-11-18 NOTE — Telephone Encounter (Signed)
Pt would like refill on Alprazolam, takes prn

## 2016-11-18 NOTE — Telephone Encounter (Signed)
Done

## 2016-11-18 NOTE — Telephone Encounter (Signed)
Looks like it was last rx'd in 2016, for #10.  Okay to refill #10

## 2016-12-08 ENCOUNTER — Ambulatory Visit: Payer: Medicare Other | Admitting: Family Medicine

## 2016-12-13 NOTE — Progress Notes (Signed)
Tawana ScaleZach Saphyra Hutt D.O. [facility] [address] [place], [place] [address] Phone: [phone] Subjective:    I'm seeing this patient by the request  of:  [name], [name], MD  CC: multiple joint pain f/u  BJY:NWGNFAOZHYHPI:Subjective  Lisa [name] is a 76 y.o. female coming in with complaint of multiple joint pains. Having chronic neck and back pain. Patient has been doing relatively well overall. Patient at last exam was having some increasing discomfort and pain likely secondary to her inability to workout secondary to recent illnesses. Patient states he has had increasing stress recently. Patient describes the pain as more of a dull, throbbing aching pain. Still seems to be over the body. Seems to be associated with a stress. Negative. 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       Past Medical History:  Diagnosis Date  . Allergy   . Colon polyps    hyperplastic  . Hypercholesteremia   . Hypertension   . Insomnia   . Leukopenia   . Multinodular thyroid   . OA (osteoarthritis)    hips, knees and LS  . Osteopenia   . Psoriasis   . Shingles 08/2016   left forehead  . Vitamin D deficiency    Past Surgical History:  Procedure Laterality Date  . cardiolyte  02-2002  . COLONOSCOPY  12-2006  . FLEXIBLE SIGMOIDOSCOPY  05-2000  . HAND TENDON SURGERY  1979   L 4th finger  . KNEE SURGERY  1969   Left, cartilage  . TONSILLECTOMY AND ADENOIDECTOMY     Social History   Social History  . Marital status: Married    Spouse name: N/A  . Number of children: 2  . Years of education: N/A   Occupational History  . 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 Smoking status: Never Smoker  . Smokeless tobacco: Never Used  . Alcohol use 0.0 oz/week     Comment: socially, 2 drinks twice weekly  . Drug use: No  . Sexual activity: Not Currently    Partners: Male   Other Topics Concern  . None   Social History Narrative   Lives  with her husband, who has cognitive deficits.  [place] in [place] (with 2 grandchildren there), daughter and 1 grandson are local.     Allergies  Allergen Reactions  . Amoxicillin Rash  . Erythromycin Rash  . Sulfa Antibiotics Rash   Family History  Problem Relation Age of Onset  . Allergies Mother   . Hypertension Mother   . Vision loss Mother   . Heart disease Mother   . Arthritis Mother   . Arthritis Father   . Heart disease Father [id number]  . Hearing loss Sister   . Hypertension Sister   . Hyperlipidemia Sister   . Arthritis Sister   . Heart disease Sister 2688    massive MI  . Arthritis Sister   . Heart disease Sister   . Hyperlipidemia Sister   . Hypertension Sister   . Arthritis Sister   . Hypertension Sister   . Hyperlipidemia Sister   . Thyroid disease Sister   . Diabetes Sister 1872    diet controlled  . Vision loss Sister     macular degeneration  . Stroke Maternal Grandmother   . Heart disease Maternal Grandfather   . Stroke Maternal Grandfather   . Heart disease Paternal Grandmother   . Stroke Paternal Grandmother   . Cancer  Neg Hx     Past medical history, social, surgical and family history all reviewed in electronic medical record.  No pertanent information unless stated regarding to the chief complaint.   Review of Systems: No headache, visual changes, nausea, vomiting, diarrhea, constipation, dizziness, abdominal pain, skin rash, fevers, chills, night sweats, weight loss, swollen lymph nodes, chest pain, shortness of breath, mood changes.  Positive muscle aches and body aches   Objective  Blood pressure 122/78, pulse 78, resp. rate 16, weight 144 lb (65.3 kg), SpO2 98 %.  Systems examined below as of 12/14/16 General: NAD A&O x3 mood, affect normal  HEENT: Pupils equal, extraocular movements intact no nystagmus Respiratory: not short of breath at rest or with speaking Cardiovascular: No lower extremity edema, non tender Skin: Warm dry intact with no  signs of infection or rash on extremities or on axial skeleton. Abdomen: Soft nontender, no masses Neuro: Cranial nerves  intact, neurovascularly intact in all extremities with 2+ DTRs and 2+ pulses. Lymph: No lymphadenopathy appreciated today  Gait normal with good balance and coordination.  MSK: Non tender with full range of motion and good stability and symmetric strength and tone of shoulders, elbows, wrist,  knee hips and ankles bilaterally.  Arthritic changes of multiple joints  Osteopathic findings Cervical C2 flexed rotated and side bent right T3-7 extended rotated right  and side bent left inhaled rib noted as well      Impression and Recommendations:     This case required medical decision making of moderate complexity.      Note: This dictation was prepared with Dragon dictation along with smaller phrase technology. Any transcriptional errors that result from this process are unintentional.          Terrilee Files D.O. [facility] [address] [place], [place] [address] Phone: [phone] Subjective:    I'm seeing this patient by the request  of:  Joselyn Arrow, MD  CC: multiple joint pains  BJY:NWGNFAOZHY  Lisa [name] is a 76 y.o. female coming in with complaint of multiple joint pains. States that it seems to be more of the neck, low back, as well as left knee.  Patient states that the low back in the neck seems to be doing relatively well. We discussed icing regimen. We discussed which activities to do when she is been doing fairly regularly. Working on posture. Does respond well to manipulation previously. Patient still has some tightness but overall seems to be making some improvement.  Left knee has a history of an open meniscectomy multiple decades ago. States this seems to be doing fairly well as well. We discussed possible bracing which patient did not want to do. Continuing to monitor closely.     Past Medical History:  Diagnosis Date    . Allergy   . Colon polyps    hyperplastic  . Hypercholesteremia   . Hypertension   . Insomnia   . Leukopenia   . Multinodular thyroid   . OA (osteoarthritis)    hips, knees and LS  . Osteopenia   . Psoriasis   . Shingles 08/2016   left forehead  . Vitamin D deficiency    Past Surgical History:  Procedure Laterality Date  . cardiolyte  02-2002  . COLONOSCOPY  12-2006  . FLEXIBLE SIGMOIDOSCOPY  05-2000  . HAND TENDON SURGERY  1979   L 4th finger  . KNEE SURGERY  1969   Left, cartilage  . TONSILLECTOMY AND ADENOIDECTOMY  Social History   Social History  . Marital status: Married    Spouse name: N/A  . Number of children: 2  . Years of education: N/A   Occupational History  . Admin at [employer]   Social History Main Topics  . Smoking status: Never Smoker  . Smokeless tobacco: Never Used  . Alcohol use 0.0 oz/week     Comment: socially, 2 drinks twice weekly  . Drug use: No  . Sexual activity: Not Currently    Partners: Male   Other Topics Concern  . None   Social History Narrative   Lives with her husband, who has cognitive deficits.  Son is in [place] (with 2 grandchildren there), daughter and 1 grandson are local.     Allergies  Allergen Reactions  . Amoxicillin Rash  . Erythromycin Rash  . Sulfa Antibiotics Rash   Family History  Problem Relation Age of Onset  . Allergies Mother   . Hypertension Mother   . Vision loss Mother   . Heart disease Mother   . Arthritis Mother   . Arthritis Father   . Heart disease Father 39  . Hearing loss Sister   . Hypertension Sister   . Hyperlipidemia Sister   . Arthritis Sister   . Heart disease Sister 35    massive MI  . Arthritis Sister   . Heart disease Sister   . Hyperlipidemia Sister   . Hypertension Sister   . Arthritis Sister   . Hypertension Sister   . Hyperlipidemia Sister   . Thyroid disease Sister   . Diabetes Sister 85    diet controlled  . Vision loss  Sister     macular degeneration  . Stroke Maternal Grandmother   . Heart disease Maternal Grandfather   . Stroke Maternal Grandfather   . Heart disease Paternal Grandmother   . Stroke Paternal Grandmother   . Cancer Neg Hx     Past medical history, social, surgical and family history all reviewed in electronic medical record.  No pertanent information unless stated regarding to the chief complaint.   Review of Systems: No headache, visual changes, nausea, vomiting, diarrhea, constipation, dizziness, abdominal pain, skin rash, fevers, chills, night sweats, weight loss, swollen lymph nodes, body aches, joint swelling, muscle aches, chest pain, shortness of breath, mood changes.   Objective  Blood pressure 122/78, pulse 78, resp. rate 16, weight 144 lb (65.3 kg), SpO2 98 %.  General: No apparent distress alert and oriented x3 mood and affect normal, dressed appropriately.  HEENT: Pupils equal, extraocular movements intact  Respiratory: Patient's speak in full sentences and does not appear short of breath  Cardiovascular: No lower extremity edema, non tender, no erythema  Skin: Warm dry intact with no signs of infection or rash on extremities or on axial skeleton.  Abdomen: Soft nontender  Neuro: Cranial nerves II through XII are intact, neurovascularly intact in all extremities with 2+ DTRs and 2+ pulses.  Lymph: No lymphadenopathy of posterior or anterior cervical chain or axillae bilaterally.  Gait normal with good balance and coordination.  MSK:  Non tender with full range of motion and good stability and symmetric strength and tone of shoulders, elbows, wrist, hip and ankles bilaterally. Arthritic changes multiple joints Neck: Inspection mild increase in lordosis No palpable stepoffs. Negative Spurling's maneuver. Appear full range of motion but still lacks some extension Grip strength and sensation normal in bilateral hands Strength good C4 to T1 distribution No sensory change to  C4 to T1 Negative Hoffman sign bilaterally Reflexes normal Back Exam:  Inspection: increase in kyphosis with poor posture Motion: Flexion 35 deg, Extension 15 deg, Side Bending to 25 deg bilaterally,  Rotation to 25 deg bilaterally  SLR laying: Negative  XSLR laying: Negative  Palpable tenderness:nontender on exam today. FABER: tightness but negative Sensory change: Gross sensation intact to all lumbar and sacral dermatomes.  Reflexes: 2+ at both patellar tendons, 2+ at achilles tendons, Babinski's downgoing.  Strength at foot  Plantar-flexion: 5/5 Dorsi-flexion: 5/5 Eversion: 5/5 Inversion: 5/5  Leg strength  Quad: 5/5 Hamstring: 5/5 Hip flexor: 5/5 Hip abductors: 4/5  Gait unremarkable. Mild improvement   Osteopathic findings C2 F RS right T3-5 E R right side bent left inhaled 3rd rib.      Impression and Recommendations:     This case required medical decision making of moderate complexity.      Note: This dictation was prepared with Dragon dictation along with smaller phrase technology. Any transcriptional errors that result from this process are unintentional.

## 2016-12-14 ENCOUNTER — Encounter: Payer: Self-pay | Admitting: Family Medicine

## 2016-12-14 ENCOUNTER — Ambulatory Visit (INDEPENDENT_AMBULATORY_CARE_PROVIDER_SITE_OTHER): Payer: Medicare Other | Admitting: Family Medicine

## 2016-12-14 VITALS — BP 122/78 | HR 78 | Resp 16 | Wt 144.0 lb

## 2016-12-14 DIAGNOSIS — M9988 Other biomechanical lesions of rib cage: Secondary | ICD-10-CM | POA: Diagnosis not present

## 2016-12-14 DIAGNOSIS — M159 Polyosteoarthritis, unspecified: Secondary | ICD-10-CM

## 2016-12-14 DIAGNOSIS — M150 Primary generalized (osteo)arthritis: Secondary | ICD-10-CM

## 2016-12-14 DIAGNOSIS — M999 Biomechanical lesion, unspecified: Secondary | ICD-10-CM | POA: Diagnosis not present

## 2016-12-14 NOTE — Assessment & Plan Note (Signed)
Patient is having more pain overall. Likely secondary to some of the stress. Patient has not been as active as she usually is secondary to having to care for her husband. I will like to see her little more frequently and we'll see patient back again in 6 weeks.

## 2016-12-14 NOTE — Patient Instructions (Addendum)
Good to see you  You are doing great overall.  Remember to take care of yourself.  Vitamin C 500mg  with iron containing meals, have your PCP check an iron panel.  DHEA 50 mg daily for 3 weeks then discontinue  Stay active.  See me again in 6 weeks.

## 2016-12-14 NOTE — Assessment & Plan Note (Signed)
Decision today to treat with OMT was based on Physical Exam  After verbal consent patient was treated with HVLA, ME, FPR techniques in cervical, thoracic, rib areas  Patient tolerated the procedure well with improvement in symptoms  Patient given exercises, stretches and lifestyle modifications  See medications in patient instructions if given  Patient will follow up in 6 weeks 

## 2016-12-16 DIAGNOSIS — H5213 Myopia, bilateral: Secondary | ICD-10-CM | POA: Diagnosis not present

## 2016-12-16 DIAGNOSIS — H524 Presbyopia: Secondary | ICD-10-CM | POA: Diagnosis not present

## 2016-12-16 DIAGNOSIS — H52203 Unspecified astigmatism, bilateral: Secondary | ICD-10-CM | POA: Diagnosis not present

## 2017-01-01 ENCOUNTER — Other Ambulatory Visit: Payer: Self-pay | Admitting: Family Medicine

## 2017-01-01 DIAGNOSIS — G4700 Insomnia, unspecified: Secondary | ICD-10-CM

## 2017-01-04 NOTE — Telephone Encounter (Signed)
This was written for a year in September (90 with 3 refills), so refill shouldn't be needed yet. Verify with pharmacy please

## 2017-01-04 NOTE — Telephone Encounter (Signed)
Is this okay to refill? 

## 2017-01-05 ENCOUNTER — Telehealth: Payer: Self-pay

## 2017-01-05 MED ORDER — ESCITALOPRAM OXALATE 10 MG PO TABS: 10.0000 mg | ORAL_TABLET | Freq: Every day | ORAL | 1 refills | Status: DC

## 2017-01-05 NOTE — Telephone Encounter (Signed)
Refill request rcvd from [facility] for lexapro. Trixie Rude/RLB

## 2017-01-20 ENCOUNTER — Telehealth: Payer: Self-pay | Admitting: Family Medicine

## 2017-01-20 ENCOUNTER — Other Ambulatory Visit: Payer: Self-pay

## 2017-01-20 MED ORDER — ALPRAZOLAM 0.25 MG PO TABS: 0.2500 mg | ORAL_TABLET | Freq: Three times a day (TID) | ORAL | 0 refills | Status: DC | PRN

## 2017-01-20 NOTE — Telephone Encounter (Signed)
Lisa MessierKathy had me call in Xanax per Dr.Knapp

## 2017-01-20 NOTE — Telephone Encounter (Signed)
Last filled 4/12.  Okay to refill

## 2017-01-20 NOTE — Telephone Encounter (Signed)
Pt called for refills of Xanax. [facility]. Pt can be reached at [phone].

## 2017-01-21 NOTE — Telephone Encounter (Signed)
cheri to handle

## 2017-01-25 ENCOUNTER — Ambulatory Visit: Payer: Medicare Other | Admitting: Family Medicine

## 2017-01-25 ENCOUNTER — Encounter: Payer: Self-pay | Admitting: Family Medicine

## 2017-01-25 ENCOUNTER — Ambulatory Visit (INDEPENDENT_AMBULATORY_CARE_PROVIDER_SITE_OTHER): Payer: Medicare Other | Admitting: Family Medicine

## 2017-01-25 VITALS — BP 150/72 | HR 76 | Ht 60.0 in | Wt 138.0 lb

## 2017-01-25 DIAGNOSIS — M159 Polyosteoarthritis, unspecified: Secondary | ICD-10-CM

## 2017-01-25 DIAGNOSIS — M999 Biomechanical lesion, unspecified: Secondary | ICD-10-CM

## 2017-01-25 DIAGNOSIS — M9908 Segmental and somatic dysfunction of rib cage: Secondary | ICD-10-CM

## 2017-01-25 DIAGNOSIS — M150 Primary generalized (osteo)arthritis: Secondary | ICD-10-CM

## 2017-01-25 NOTE — Patient Instructions (Addendum)
Great to see you  I know this is hard.  Have a great trip and see me afterward.  I am here if you need me Ic

## 2017-01-25 NOTE — Progress Notes (Signed)
[name] [address] [place], [place] [address] Phone: [phone] Subjective:    I'm seeing this patient by the request  of:  Joselyn Arrow, MD  CC: multiple joint pain f/u  [id number]  SHAR PAEZ is a 76 y.o. female coming in with complaint of multiple joint pains. Having chronic neck and back pain. Patient is under a lot of more stress and has noticed some increasing pain. Not been doing as much of her regular activity at this time. Patient is unfortunately putting her husband named dementia facility. This is caused more aches and pains secondary to distress and not taking care of herself.     Past Medical History:  Diagnosis Date  . Allergy   . Colon polyps    hyperplastic  . Hypercholesteremia   . Hypertension   . Insomnia   . Leukopenia   . Multinodular thyroid   . OA (osteoarthritis)    hips, knees and LS  . Osteopenia   . Psoriasis   . Shingles 08/2016   left forehead  . Vitamin D deficiency    Past Surgical History:  Procedure Laterality Date  . cardiolyte  02-2002  . COLONOSCOPY  12-2006  . FLEXIBLE SIGMOIDOSCOPY  05-2000  . HAND TENDON SURGERY  1979   L 4th finger  . KNEE SURGERY  1969   Left, cartilage  . TONSILLECTOMY AND ADENOIDECTOMY     Social History   Social History  . Marital status: Married    Spouse name: N/A  . Number of children: 2  . 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 Smoking status: Never Smoker  . Smokeless tobacco: Never Used  . Alcohol use 0.0 oz/week     Comment: socially, 2 drinks twice weekly  . Drug use: No  . Sexual activity: Not Currently    Partners: Male   Other Topics Concern  . None   Social History Narrative   Lives with her husband, who has cognitive deficits.  Son is in [place] (with 2 grandchildren there), daughter and 1 grandson are local.     Allergies  Allergen  Reactions  . Amoxicillin Rash  . Erythromycin Rash  . Sulfa Antibiotics Rash   Family History  Problem Relation Age of Onset  . Allergies Mother   . Hypertension Mother   . Vision loss Mother   . Heart disease Mother   . Arthritis Mother   . Arthritis Father   . Heart disease Father 84  . Hearing loss Sister   . Hypertension Sister   . Hyperlipidemia Sister   . Arthritis Sister   . Heart disease Sister 44       massive MI  . Arthritis Sister   . Heart disease Sister   . Hyperlipidemia Sister   . Hypertension Sister   . Arthritis Sister   . Hypertension Sister   . Hyperlipidemia Sister   . Thyroid disease Sister   . Diabetes Sister 46       diet controlled  . Vision loss Sister        macular degeneration  . Stroke Maternal Grandmother   . Heart disease Maternal Grandfather   . Stroke Maternal Grandfather   . Heart disease Paternal Grandmother   . Stroke Paternal Grandmother   . Cancer Neg Hx     Past medical history, social, surgical and family history all  reviewed in electronic medical record.  No pertanent information unless stated regarding to the chief complaint.   Review of Systems: No headache, visual changes, nausea, vomiting, diarrhea, constipation, dizziness, abdominal pain, skin rash, fevers, chills, night sweats, weight loss, swollen lymph nodes, body aches, joint swelling, chest pain, shortness of breath, mood changes.  Positive muscle aches   Objective  Blood pressure (!) 150/72, pulse 76, height 5' (1.524 m), weight 138 lb (62.6 kg).  Systems examined below as of 01/25/17 General: NAD A&O x3 mood, affect normal  HEENT: Pupils equal, extraocular movements intact no nystagmus Respiratory: not short of breath at rest or with speaking Cardiovascular: No lower extremity edema, non tender Skin: Warm dry intact with no signs of infection or rash on extremities or on axial skeleton. Abdomen: Soft nontender, no masses Neuro: Cranial nerves  intact,  neurovascularly intact in all extremities with 2+ DTRs and 2+ pulses. Lymph: No lymphadenopathy appreciated today  MSK: Non tender with full range of motion and good stability and symmetric strength and tone of shoulders, elbows, wrist,  knee hips and ankles bilaterally.  Arthritic changes of multiple joints  Osteopathic findings Cervical C2 flexed rotated and side bent right C4 flexed rotated and side bent left C6 flexed rotated and side bent left T3-9 extended rotated right and side bent left     Impression and Recommendations:     This case required medical decision making of moderate complexity.      Note: This dictation was prepared with Dragon dictation along with smaller phrase technology. Any transcriptional errors that result from this process are unintentional.          Terrilee Files D.O. [facility] [address] [place], [place] [address] Phone: [phone] Subjective:    I'm seeing this patient by the request  of:  Joselyn Arrow, MD  CC: multiple joint pains  BJY:NWGNFAOZHY  Lisa [name] is a 76 y.o. female coming in with complaint of multiple joint pains. States that it seems to be more of the neck, low back, as well as left knee.  Patient states that the low back in the neck seems to be doing relatively well. We discussed icing regimen. We discussed which activities to do when she is been doing fairly regularly. Working on posture. Does respond well to manipulation previously. Patient still has some tightness but overall seems to be making some improvement.  Left knee has a history of an open meniscectomy multiple decades ago. States this seems to be doing fairly well as well. We discussed possible bracing which patient did not want to do. Continuing to monitor closely.     Past Medical History:  Diagnosis Date  . Allergy   . Colon polyps    hyperplastic  . Hypercholesteremia   . Hypertension   . Insomnia   . Leukopenia   . Multinodular  thyroid   . OA (osteoarthritis)    hips, knees and LS  . Osteopenia   . Psoriasis   . Shingles 08/2016   left forehead  . Vitamin D deficiency    Past Surgical History:  Procedure Laterality Date  . cardiolyte  02-2002  . COLONOSCOPY  12-2006  . FLEXIBLE SIGMOIDOSCOPY  05-2000  . HAND TENDON SURGERY  1979   L 4th finger  . KNEE SURGERY  1969   Left, cartilage  . TONSILLECTOMY AND ADENOIDECTOMY     Social History   Social History  . Marital status: Married    Spouse name:  N/A  . Number of children: 2  . 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 Smoking status: Never Smoker  . Smokeless tobacco: Never Used  . Alcohol use 0.0 oz/week     Comment: socially, 2 drinks twice weekly  . Drug use: No  . Sexual activity: Not Currently    Partners: Male   Other Topics Concern  . None   Social History Narrative   Lives with her husband, who has cognitive deficits.  Son is in [place] (with 2 grandchildren there), daughter and 1 grandson are local.     Allergies  Allergen Reactions  . Amoxicillin Rash  . Erythromycin Rash  . Sulfa Antibiotics Rash   Family History  Problem Relation Age of Onset  . Allergies Mother   . Hypertension Mother   . Vision loss Mother   . Heart disease Mother   . Arthritis Mother   . Arthritis Father   . Heart disease Father 33  . Hearing loss Sister   . Hypertension Sister   . Hyperlipidemia Sister   . Arthritis Sister   . Heart disease Sister 66       massive MI  . Arthritis Sister   . Heart disease Sister   . Hyperlipidemia Sister   . Hypertension Sister   . Arthritis Sister   . Hypertension Sister   . Hyperlipidemia Sister   . Thyroid disease Sister   . Diabetes Sister 7       diet controlled  . Vision loss Sister        macular degeneration  . Stroke Maternal Grandmother   . Heart disease Maternal Grandfather   . Stroke Maternal  Grandfather   . Heart disease Paternal Grandmother   . Stroke Paternal Grandmother   . Cancer Neg Hx     Past medical history, social, surgical and family history all reviewed in electronic medical record.  No pertanent information unless stated regarding to the chief complaint.   Review of Systems: No headache, visual changes, nausea, vomiting, diarrhea, constipation, dizziness, abdominal pain, skin rash, fevers, chills, night sweats, weight loss, swollen lymph nodes, body aches, joint swelling, muscle aches, chest pain, shortness of breath, mood changes.   Objective  Blood pressure (!) 150/72, pulse 76, height 5' (1.524 m), weight 138 lb (62.6 kg).  General: No apparent distress alert and oriented x3 mood and affect normal, dressed appropriately.  HEENT: Pupils equal, extraocular movements intact  Respiratory: Patient's speak in full sentences and does not appear short of breath  Cardiovascular: No lower extremity edema, non tender, no erythema  Skin: Warm dry intact with no signs of infection or rash on extremities or on axial skeleton.  Abdomen: Soft nontender  Neuro: Cranial nerves II through XII are intact, neurovascularly intact in all extremities with 2+ DTRs and 2+ pulses.  Lymph: No lymphadenopathy of posterior or anterior cervical chain or axillae bilaterally.  Gait normal with good balance and coordination.  MSK:  Non tender with full range of motion and good stability and symmetric strength and tone of shoulders, elbows, wrist, hip and ankles bilaterally. Arthritic changes multiple joints Neck: Inspection mild increase in lordosis No palpable stepoffs. Negative Spurling's maneuver. Appear full range of motion but still lacks some extension Grip strength and sensation normal in bilateral hands Strength good C4 to T1 distribution No sensory change to C4 to T1 Negative Hoffman sign bilaterally Reflexes normal  Back Exam:  Inspection: increase in kyphosis with poor  posture Motion: Flexion 35 deg, Extension 15 deg, Side Bending to 25 deg bilaterally,  Rotation to 25 deg bilaterally  SLR laying: Negative  XSLR laying: Negative  Palpable tenderness:nontender on exam today. FABER: tightness but negative Sensory change: Gross sensation intact to all lumbar and sacral dermatomes.  Reflexes: 2+ at both patellar tendons, 2+ at achilles tendons, Babinski's downgoing.  Strength at foot  Plantar-flexion: 5/5 Dorsi-flexion: 5/5 Eversion: 5/5 Inversion: 5/5  Leg strength  Quad: 5/5 Hamstring: 5/5 Hip flexor: 5/5 Hip abductors: 4/5  Gait unremarkable. Mild improvement   Osteopathic findings C2 F RS right T3-5 E R right side bent left inhaled 3rd rib.      Impression and Recommendations:     This case required medical decision making of moderate complexity.      Note: This dictation was prepared with Dragon dictation along with smaller phrase technology. Any transcriptional errors that result from this process are unintentional.

## 2017-01-25 NOTE — Assessment & Plan Note (Signed)
Patient does have osteophytic changes. Has responded fairly well to osteopathic manipulation previously. We will continue with the same current regimen but increased frequency. Patient is having increasing stress that likely contributing to some of the pain.

## 2017-01-25 NOTE — Assessment & Plan Note (Signed)
Decision today to treat with OMT was based on Physical Exam  After verbal consent patient was treated with HVLA, ME, FPR techniques in cervical, thoracic, rib, areas  Patient tolerated the procedure well with improvement in symptoms  Patient given exercises, stretches and lifestyle modifications  See medications in patient instructions if given  Patient will follow up in 6-8 weeks 

## 2017-02-01 ENCOUNTER — Encounter: Payer: Self-pay | Admitting: Family Medicine

## 2017-02-03 ENCOUNTER — Encounter: Payer: Self-pay | Admitting: Family Medicine

## 2017-03-03 ENCOUNTER — Ambulatory Visit (INDEPENDENT_AMBULATORY_CARE_PROVIDER_SITE_OTHER): Payer: Medicare Other | Admitting: Family Medicine

## 2017-03-03 ENCOUNTER — Encounter: Payer: Self-pay | Admitting: Family Medicine

## 2017-03-03 VITALS — BP 132/82 | HR 66 | Ht 62.5 in | Wt 138.0 lb

## 2017-03-03 DIAGNOSIS — M542 Cervicalgia: Secondary | ICD-10-CM

## 2017-03-03 DIAGNOSIS — M999 Biomechanical lesion, unspecified: Secondary | ICD-10-CM | POA: Diagnosis not present

## 2017-03-03 NOTE — Patient Instructions (Signed)
Good to see you  Ice I syour friend I would like to see you again 6-8 weeks

## 2017-03-03 NOTE — Assessment & Plan Note (Signed)
Significant neck pain. Discussed with patient again. We discussed posture, ergonomic, which activities doing which ones to avoid. Patient increase activity as tolerated. Patient come back and see me again in 8-10 weeks.

## 2017-03-03 NOTE — Assessment & Plan Note (Signed)
Decision today to treat with OMT was based on Physical Exam  After verbal consent patient was treated with HVLA, ME, FPR techniques in cervical, thoracic, rib areas  Patient tolerated the procedure well with improvement in symptoms  Patient given exercises, stretches and lifestyle modifications  See medications in patient instructions if given  Patient will follow up in 8 weeks 

## 2017-03-03 NOTE — Progress Notes (Signed)
[name] [address] [place], [place] [address] Phone: [phone] Subjective:    I'm seeing this patient by the request  of:  Joselyn Arrow, MD  CC: multiple joint pain f/u  ION:[id number]  Lisa [name] is a 76 y.o. female coming in with complaint of multiple joint pains. Having chronic neck and back pain. Patient is still having some personal changes recently. Patient is doing relatively well. Continues to have some mild neck pain. Describes it as a dull, throbbing aching sensation. Nothing radiating down the arm stump. Has been doing the exercises a little more regularly. Patient is also getting massaging.     Past Medical History:  Diagnosis Date  . Allergy   . Colon polyps    hyperplastic  . Hypercholesteremia   . Hypertension   . Insomnia   . Leukopenia   . Multinodular thyroid   . OA (osteoarthritis)    hips, knees and LS  . Osteopenia   . Psoriasis   . Shingles 08/2016   left forehead  . Vitamin D deficiency    Past Surgical History:  Procedure Laterality Date  . cardiolyte  02-2002  . COLONOSCOPY  12-2006  . FLEXIBLE SIGMOIDOSCOPY  05-2000  . HAND TENDON SURGERY  1979   L 4th finger  . KNEE SURGERY  1969   Left, cartilage  . TONSILLECTOMY AND ADENOIDECTOMY     Social History   Social History  . Marital status: Married    Spouse name: N/A  . Number of children: 2  . 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 Smoking status: Never Smoker  . Smokeless tobacco: Never Used  . Alcohol use 0.0 oz/week     Comment: socially, 2 drinks twice weekly  . Drug use: No  . Sexual activity: Not Currently    Partners: Male   Other Topics Concern  . None   Social History Narrative   Lives with her husband, who has cognitive deficits.  Son is in [place] (with 2 grandchildren there), daughter and 1 grandson are local.     Allergies    Allergen Reactions  . Amoxicillin Rash  . Erythromycin Rash  . Sulfa Antibiotics Rash   Family History  Problem Relation Age of Onset  . Allergies Mother   . Hypertension Mother   . Vision loss Mother   . Heart disease Mother   . Arthritis Mother   . Arthritis Father   . Heart disease Father 82  . Hearing loss Sister   . Hypertension Sister   . Hyperlipidemia Sister   . Arthritis Sister   . Heart disease Sister 65       massive MI  . Arthritis Sister   . Heart disease Sister   . Hyperlipidemia Sister   . Hypertension Sister   . Arthritis Sister   . Hypertension Sister   . Hyperlipidemia Sister   . Thyroid disease Sister   . Diabetes Sister 74       diet controlled  . Vision loss Sister        macular degeneration  . Stroke Maternal Grandmother   . Heart disease Maternal Grandfather   . Stroke Maternal Grandfather   . Heart disease Paternal Grandmother   . Stroke Paternal Grandmother   . Cancer Neg Hx     Past medical history, social, surgical and family history all reviewed in  electronic medical record.  No pertanent information unless stated regarding to the chief complaint.   Review of Systems: No headache, visual changes, nausea, vomiting, diarrhea, constipation, dizziness, abdominal pain, skin rash, fevers, chills, night sweats, weight loss, swollen lymph nodes, body aches, joint swelling, muscle aches, chest pain, shortness of breath, mood changes.     Objective  Blood pressure 132/82, pulse 66, height 5' 2.5" (1.588 m), weight 138 lb (62.6 kg).  Systems examined below as of 03/03/17 General: NAD A&O x3 mood, affect normal  HEENT: Pupils equal, extraocular movements intact no nystagmus Respiratory: not short of breath at rest or with speaking Cardiovascular: No lower extremity edema, non tender Skin: Warm dry intact with no signs of infection or rash on extremities or on axial skeleton. Abdomen: Soft nontender, no masses Neuro: Cranial nerves  intact,  neurovascularly intact in all extremities with 2+ DTRs and 2+ pulses. Lymph: No lymphadenopathy appreciated today  Gait normal with good balance and coordination.  MSK: Non tender with full range of motion and good stability and symmetric strength and tone of shoulders, elbows, wrist,  knee hips and ankles bilaterally.  Arthritic changes of multiple joints Osteopathic findings C2 flexed rotated and side bent right C4 flexed rotated and side bent left C6 flexed rotated and side bent left T3-9 extended rotated right and side bent left     Impression and Recommendations:     This case required medical decision making of moderate complexity.      Note: This dictation was prepared with Dragon dictation along with smaller phrase technology. Any transcriptional errors that result from this process are unintentional.          Terrilee FilesZach Mazey Mantell D.O. [facility] [address] [place], [place] [address] Phone: [phone]) [phone] Subjective:    I'm seeing this patient by the request  of:  [name], [name], MD  CC: multiple joint pains  BJY:[id number]:Subjective  Minerva AreolaJoan B Meroney is a 76 y.o. female coming in with complaint of multiple joint pains. States that it seems to be more of the neck, low back, as well as left knee.  Patient states that the low back in the neck seems to be doing relatively well. We discussed icing regimen. We discussed which activities to do when she is been doing fairly regularly. Working on posture. Does respond well to manipulation previously. Patient still has some tightness but overall seems to be making some improvement.  Left knee has a history of an open meniscectomy multiple decades ago. States this seems to be doing fairly well as well. We discussed possible bracing which patient did not want to do. Continuing to monitor closely.     Past Medical History:  Diagnosis Date  . Allergy   . Colon polyps    hyperplastic  . Hypercholesteremia   . Hypertension   .  Insomnia   . Leukopenia   . Multinodular thyroid   . OA (osteoarthritis)    hips, knees and LS  . Osteopenia   . Psoriasis   . Shingles 08/2016   left forehead  . Vitamin D deficiency    Past Surgical History:  Procedure Laterality Date  . cardiolyte  02-2002  . COLONOSCOPY  12-2006  . FLEXIBLE SIGMOIDOSCOPY  05-2000  . HAND TENDON SURGERY  1979   L 4th finger  . KNEE SURGERY  1969   Left, cartilage  . TONSILLECTOMY AND ADENOIDECTOMY     Social History   Social History  . Marital status: Married  Spouse name: N/A  . Number of children: 2  . 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 Smoking status: Never Smoker  . Smokeless tobacco: Never Used  . Alcohol use 0.0 oz/week     Comment: socially, 2 drinks twice weekly  . Drug use: No  . Sexual activity: Not Currently    Partners: Male   Other Topics Concern  . None   Social History Narrative   Lives with her husband, who has cognitive deficits.  Son is in [place] (with 2 grandchildren there), daughter and 1 grandson are local.     Allergies  Allergen Reactions  . Amoxicillin Rash  . Erythromycin Rash  . Sulfa Antibiotics Rash   Family History  Problem Relation Age of Onset  . Allergies Mother   . Hypertension Mother   . Vision loss Mother   . Heart disease Mother   . Arthritis Mother   . Arthritis Father   . Heart disease Father [id number]  . Hearing loss Sister   . Hypertension Sister   . Hyperlipidemia Sister   . Arthritis Sister   . Heart disease Sister [id number]       massive MI  . Arthritis Sister   . Heart disease Sister   . Hyperlipidemia Sister   . Hypertension Sister   . Arthritis Sister   . Hypertension Sister   . Hyperlipidemia Sister   . Thyroid disease Sister   . Diabetes Sister 1172       diet controlled  . Vision loss Sister        macular degeneration  . Stroke Maternal Grandmother   . Heart disease  Maternal Grandfather   . Stroke Maternal Grandfather   . Heart disease Paternal Grandmother   . Stroke Paternal Grandmother   . Cancer Neg Hx     Past medical history, social, surgical and family history all reviewed in electronic medical record.  No pertanent information unless stated regarding to the chief complaint.   Review of Systems: No headache, visual changes, nausea, vomiting, diarrhea, constipation, dizziness, abdominal pain, skin rash, fevers, chills, night sweats, weight loss, swollen lymph nodes, body aches, joint swelling, muscle aches, chest pain, shortness of breath, mood changes.   Objective  Blood pressure 132/82, pulse 66, height 5' 2.5" (1.588 m), weight 138 lb (62.6 kg).  General: No apparent distress alert and oriented x3 mood and affect normal, dressed appropriately.  HEENT: Pupils equal, extraocular movements intact  Respiratory: Patient's speak in full sentences and does not appear short of breath  Cardiovascular: No lower extremity edema, non tender, no erythema  Skin: Warm dry intact with no signs of infection or rash on extremities or on axial skeleton.  Abdomen: Soft nontender  Neuro: Cranial nerves II through XII are intact, neurovascularly intact in all extremities with 2+ DTRs and 2+ pulses.  Lymph: No lymphadenopathy of posterior or anterior cervical chain or axillae bilaterally.  Gait normal with good balance and coordination.  MSK:  Non tender with full range of motion and good stability and symmetric strength and tone of shoulders, elbows, wrist, hip and ankles bilaterally. Arthritic changes multiple joints Neck: Inspection mild increase in lordosis No palpable stepoffs. Negative Spurling's maneuver. Appear full range of motion but still lacks some extension Grip strength and sensation normal in bilateral hands Strength good C4 to T1 distribution No sensory change to C4 to T1 Negative Hoffman sign bilaterally  Reflexes normal Back Exam:    Inspection: increase in kyphosis with poor posture Motion: Flexion 35 deg, Extension 15 deg, Side Bending to 25 deg bilaterally,  Rotation to 25 deg bilaterally  SLR laying: Negative  XSLR laying: Negative  Palpable tenderness:nontender on exam today. FABER: tightness but negative Sensory change: Gross sensation intact to all lumbar and sacral dermatomes.  Reflexes: 2+ at both patellar tendons, 2+ at achilles tendons, Babinski's downgoing.  Strength at foot  Plantar-flexion: 5/5 Dorsi-flexion: 5/5 Eversion: 5/5 Inversion: 5/5  Leg strength  Quad: 5/5 Hamstring: 5/5 Hip flexor: 5/5 Hip abductors: 4/5  Gait unremarkable. Mild improvement   Osteopathic findings C2 F RS right T3-5 E R right side bent left inhaled 3rd rib.      Impression and Recommendations:     This case required medical decision making of moderate complexity.      Note: This dictation was prepared with Dragon dictation along with smaller phrase technology. Any transcriptional errors that result from this process are unintentional.

## 2017-03-09 DIAGNOSIS — Z1231 Encounter for screening mammogram for malignant neoplasm of breast: Secondary | ICD-10-CM | POA: Diagnosis not present

## 2017-03-09 DIAGNOSIS — M85851 Other specified disorders of bone density and structure, right thigh: Secondary | ICD-10-CM | POA: Diagnosis not present

## 2017-03-09 DIAGNOSIS — M8588 Other specified disorders of bone density and structure, other site: Secondary | ICD-10-CM | POA: Diagnosis not present

## 2017-03-09 DIAGNOSIS — M8580 Other specified disorders of bone density and structure, unspecified site: Secondary | ICD-10-CM | POA: Diagnosis not present

## 2017-03-09 LAB — HM DEXA SCAN

## 2017-03-09 LAB — HM MAMMOGRAPHY

## 2017-03-13 ENCOUNTER — Other Ambulatory Visit: Payer: Self-pay | Admitting: Family Medicine

## 2017-03-13 DIAGNOSIS — G4700 Insomnia, unspecified: Secondary | ICD-10-CM

## 2017-03-14 ENCOUNTER — Other Ambulatory Visit: Payer: Self-pay | Admitting: Family Medicine

## 2017-03-14 ENCOUNTER — Encounter: Payer: Self-pay | Admitting: Family Medicine

## 2017-03-14 ENCOUNTER — Telehealth: Payer: Self-pay | Admitting: Family Medicine

## 2017-03-14 DIAGNOSIS — I10 Essential (primary) hypertension: Secondary | ICD-10-CM

## 2017-03-14 MED ORDER — IRBESARTAN 300 MG PO TABS: 300.0000 mg | ORAL_TABLET | Freq: Every day | ORAL | 0 refills | Status: DC

## 2017-03-14 NOTE — Telephone Encounter (Signed)
Given a year supply 05/05/16, shouldn't need refill yet

## 2017-03-14 NOTE — Telephone Encounter (Signed)
Recv'd fax from [facility] requesting refill Irbesartan

## 2017-03-14 NOTE — Telephone Encounter (Signed)
Ok to renew?  

## 2017-03-14 NOTE — Telephone Encounter (Signed)
done

## 2017-03-15 ENCOUNTER — Encounter: Payer: Self-pay | Admitting: Family Medicine

## 2017-03-17 ENCOUNTER — Encounter: Payer: Self-pay | Admitting: Family Medicine

## 2017-03-23 ENCOUNTER — Other Ambulatory Visit: Payer: Self-pay | Admitting: Family Medicine

## 2017-03-23 ENCOUNTER — Encounter: Payer: Self-pay | Admitting: Family Medicine

## 2017-03-23 DIAGNOSIS — G4700 Insomnia, unspecified: Secondary | ICD-10-CM

## 2017-04-11 ENCOUNTER — Encounter: Payer: Self-pay | Admitting: Family Medicine

## 2017-04-12 MED ORDER — ALPRAZOLAM 0.25 MG PO TABS: 0.2500 mg | ORAL_TABLET | Freq: Three times a day (TID) | ORAL | 0 refills | Status: DC | PRN

## 2017-04-26 ENCOUNTER — Ambulatory Visit (INDEPENDENT_AMBULATORY_CARE_PROVIDER_SITE_OTHER): Payer: Medicare Other | Admitting: Family Medicine

## 2017-04-26 ENCOUNTER — Encounter: Payer: Self-pay | Admitting: Family Medicine

## 2017-04-26 VITALS — BP 140/80 | HR 78 | Ht 62.0 in | Wt 146.0 lb

## 2017-04-26 DIAGNOSIS — M542 Cervicalgia: Secondary | ICD-10-CM

## 2017-04-26 DIAGNOSIS — M999 Biomechanical lesion, unspecified: Secondary | ICD-10-CM

## 2017-04-26 NOTE — Patient Instructions (Signed)
Good to see you  Keep it up  Enjoy the little one See me again in 2 months.

## 2017-04-26 NOTE — Assessment & Plan Note (Signed)
Patient does have some neck pain. We discussed with patient at great length continue to work on posture and ergonomics. We discussed objective is a doing which was to avoid. Still responds fairly well to manipulation and will follow-up in 8 weeks. No changes in management.

## 2017-04-26 NOTE — Progress Notes (Signed)
[name] [address] [place], [place] [address] Phone: [phone] Subjective:    I'm seeing this patient by the request  of:  Joselyn Arrow, MD  CC: multiple joint pain f/u  [id number]  Lisa [name] is a 76 y.o. female coming in with complaint of multiple joint pains. Having chronic neck and back pain. Patient is still having some personal changes recently. Patient is doing relatively well. Continues to have some mild neck pain. Describes it as a dull, throbbing aching sensation. Nothing radiating down the arm stump. Has been doing the exercises a little more regularly. Patient is also getting massaging. She says her back is doing better but her neck is still problematic. This was a new great-grandchild and is taking care of her somewhat.     Past Medical History:  Diagnosis Date  . Allergy   . Colon polyps    hyperplastic  . Hypercholesteremia   . Hypertension   . Insomnia   . Leukopenia   . Multinodular thyroid   . OA (osteoarthritis)    hips, knees and LS  . Osteopenia   . Psoriasis   . Shingles 08/2016   left forehead  . Vitamin D deficiency    Past Surgical History:  Procedure Laterality Date  . cardiolyte  02-2002  . COLONOSCOPY  12-2006  . FLEXIBLE SIGMOIDOSCOPY  05-2000  . HAND TENDON SURGERY  1979   L 4th finger  . KNEE SURGERY  1969   Left, cartilage  . TONSILLECTOMY AND ADENOIDECTOMY     Social History   Social History  . Marital status: Married    Spouse name: N/A  . Number of children: 2  . 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 Smoking status: Never Smoker  . Smokeless tobacco: Never Used  . Alcohol use 0.0 oz/week     Comment: socially, 2 drinks twice weekly  . Drug use: No  . Sexual activity: Not Currently    Partners: Male   Other Topics Concern  . None   Social History Narrative   Lives with her  husband, who has cognitive deficits.  Son is in [place] (with 2 grandchildren there), daughter and 1 grandson are local.     Allergies  Allergen Reactions  . Amoxicillin Rash  . Erythromycin Rash  . Sulfa Antibiotics Rash   Family History  Problem Relation Age of Onset  . Allergies Mother   . Hypertension Mother   . Vision loss Mother   . Heart disease Mother   . Arthritis Mother   . Arthritis Father   . Heart disease Father 48  . Hearing loss Sister   . Hypertension Sister   . Hyperlipidemia Sister   . Arthritis Sister   . Heart disease Sister 9       massive MI  . Arthritis Sister   . Heart disease Sister   . Hyperlipidemia Sister   . Hypertension Sister   . Arthritis Sister   . Hypertension Sister   . Hyperlipidemia Sister   . Thyroid disease Sister   . Diabetes Sister 78       diet controlled  . Vision loss Sister        macular degeneration  . Stroke Maternal Grandmother   . Heart disease Maternal Grandfather   . Stroke Maternal Grandfather   . Heart disease Paternal Grandmother   .  Stroke Paternal Grandmother   . Cancer Neg Hx     Past medical history, social, surgical and family history all reviewed in electronic medical record.  No pertanent information unless stated regarding to the chief complaint.   Review of Systems: No headache, visual changes, nausea, vomiting, diarrhea, constipation, dizziness, abdominal pain, skin rash, fevers, chills, night sweats, weight loss, swollen lymph nodes, body aches, joint swelling,  chest pain, shortness of breath, mood changes.  Positive muscle aches   Objective  Blood pressure 140/80, pulse 78, height  (1.575 m), weight 146 lb (66.2 kg), SpO2 98 %.  Systems examined below as of 04/26/17 General: NAD A&O x3 mood, affect normal  HEENT: Pupils equal, extraocular movements intact no nystagmus Respiratory: not short of breath at rest or with speaking Cardiovascular: No lower extremity edema, non tender Skin: Warm  dry intact with no signs of infection or rash on extremities or on axial skeleton. Abdomen: Soft nontender, no masses Neuro: Cranial nerves  intact, neurovascularly intact in all extremities with 2+ DTRs and 2+ pulses. Lymph: No lymphadenopathy appreciated today  Gait normal with good balance and coordination.  MSK: Non tender with full range of motion and good stability and symmetric strength and tone of shoulders, elbows, wrist,  knee hips and ankles bilaterally.  Arthritic changes of multiple joints  Neck: Inspection mild loss of lordosis. No palpable stepoffs. Negative Spurling's maneuver. Lacking 10 of rotation bilaterally. Grip strength and sensation normal in bilateral hands Strength good C4 to T1 distribution No sensory change to C4 to T1 Negative Hoffman sign bilaterally Reflexes normal   Osteopathic findings C2 flexed rotated and side bent right C4 flexed rotated and side bent right C7 flexed rotated and side bent left T3 extended rotated and side bent left inhaled third rib T7 extended rotated and side bent left

## 2017-04-26 NOTE — Assessment & Plan Note (Signed)
Decision today to treat with OMT was based on Physical Exam  After verbal consent patient was treated with HVLA, ME, FPR techniques in cervical, thoracic, rib areas  Patient tolerated the procedure well with improvement in symptoms  Patient given exercises, stretches and lifestyle modifications  See medications in patient instructions if given  Patient will follow up in 8 weeks 

## 2017-04-28 ENCOUNTER — Ambulatory Visit: Payer: Medicare Other | Admitting: Family Medicine

## 2017-05-01 ENCOUNTER — Other Ambulatory Visit: Payer: Self-pay | Admitting: Family Medicine

## 2017-05-01 DIAGNOSIS — E039 Hypothyroidism, unspecified: Secondary | ICD-10-CM

## 2017-05-02 ENCOUNTER — Encounter: Payer: Self-pay | Admitting: Family Medicine

## 2017-05-05 ENCOUNTER — Other Ambulatory Visit: Payer: Medicare Other

## 2017-05-05 DIAGNOSIS — E039 Hypothyroidism, unspecified: Secondary | ICD-10-CM

## 2017-05-05 DIAGNOSIS — E7800 Pure hypercholesterolemia, unspecified: Secondary | ICD-10-CM

## 2017-05-05 DIAGNOSIS — Z5181 Encounter for therapeutic drug level monitoring: Secondary | ICD-10-CM | POA: Diagnosis not present

## 2017-05-05 DIAGNOSIS — I10 Essential (primary) hypertension: Secondary | ICD-10-CM

## 2017-05-06 LAB — COMPREHENSIVE METABOLIC PANEL
AG RATIO: 2 (calc) (ref 1.0–2.5)
ALBUMIN MSPROF: 4.3 g/dL (ref 3.6–5.1)
ALKALINE PHOSPHATASE (APISO): 55 U/L (ref 33–130)
ALT: 20 U/L (ref 6–29)
AST: 22 U/L (ref 10–35)
BILIRUBIN TOTAL: 0.5 mg/dL (ref 0.2–1.2)
BUN: 12 mg/dL (ref 7–25)
CHLORIDE: 103 mmol/L (ref 98–110)
CO2: 24 mmol/L (ref 20–32)
CREATININE: 0.66 mg/dL (ref 0.60–0.93)
Calcium: 9.8 mg/dL (ref 8.6–10.4)
GLOBULIN: 2.2 g/dL (ref 1.9–3.7)
Glucose, Bld: 93 mg/dL (ref 65–99)
POTASSIUM: 4.3 mmol/L (ref 3.5–5.3)
Sodium: 137 mmol/L (ref 135–146)
Total Protein: 6.5 g/dL (ref 6.1–8.1)

## 2017-05-06 LAB — CBC WITH DIFFERENTIAL/PLATELET
Basophils Absolute: 70 cells/uL (ref 0–200)
Basophils Relative: 2 %
EOS ABS: 238 {cells}/uL (ref 15–500)
Eosinophils Relative: 6.8 %
HEMATOCRIT: 37.9 % (ref 35.0–45.0)
HEMOGLOBIN: 13.1 g/dL (ref 11.7–15.5)
LYMPHS ABS: 1295 {cells}/uL (ref 850–3900)
MCH: 30.5 pg (ref 27.0–33.0)
MCHC: 34.6 g/dL (ref 32.0–36.0)
MCV: 88.3 fL (ref 80.0–100.0)
MPV: 9.9 fL (ref 7.5–12.5)
Monocytes Relative: 11.6 %
Neutro Abs: 1491 cells/uL — ABNORMAL LOW (ref 1500–7800)
Neutrophils Relative %: 42.6 %
Platelets: 353 10*3/uL (ref 140–400)
RBC: 4.29 10*6/uL (ref 3.80–5.10)
RDW: 12.6 % (ref 11.0–15.0)
Total Lymphocyte: 37 %
WBC: 3.5 10*3/uL — AB (ref 3.8–10.8)
WBCMIX: 406 {cells}/uL (ref 200–950)

## 2017-05-06 LAB — LIPID PANEL
Cholesterol: 144 mg/dL (ref <200)
HDL: 70 mg/dL (ref >50)
LDL CHOLESTEROL (CALC): 60 mg/dL
Non-HDL Cholesterol (Calc): 74 mg/dL (calc) (ref <130)
TRIGLYCERIDES: 56 mg/dL (ref <150)
Total CHOL/HDL Ratio: 2.1 (calc) (ref <5.0)

## 2017-05-06 LAB — TSH: TSH: 2.59 mIU/L (ref 0.40–4.50)

## 2017-05-11 NOTE — Progress Notes (Signed)
[name] is a 76 y.o. female who presents for annual physical exam, Medicare wellness visit and follow-up on chronic medical conditions.  She has the following concerns:  She had shingles in January, affecting R upper face.  She has some intermittent tingling at her right upper forehead. The pain and itching has resolved.  Hypertension follow-up: She is compliant with taking her avapro daily. Denies headaches, dizziness, chest pain, palpitations. Denies edema; denies side effects. BP's are running 122-145/70-81. Higher ones may have been related to stress from flooded bathroom recently.  She has some very mild vertigo intermittently, none since her last visit.  Hyperlipidemia follow-up: Patient is reportedly following a low-fat, low cholesterol diet. Compliant with medications and denies medication side effects.   Hypothyroidism: Thyroid biopsy in 2011 was benign; had seen Dr. Talmage Nap in the past, but we are now managing her medications. Denies fatigue, hair, skin, bowel changes. Denies any mood changes. Denies dysphagia or any thyroid concerns. Takes Synthroid on empty stomach, separate from food and other medications.  Insomnia: Well controlled with Seroquel; has been on it for well over 5 years.   Anxiety: She is taking10mg  of lexapro without side effects, and feels that she is doing well with respect to her moods and anxiety. She never needed to take alprazolam. Husband has cognitive deficits, on aricept. He was recently placed into [facility] memory unit (01/2017).  She continues to see Dr. Terrilee Files for joint pains (neck, back). Taking turmeric, tart cherry juice (only intermittently) and Vitamin D per Dr. Michaelle Copas recommendation.  She has been seeing him and getting good results regarding her neck and back discomfort.  Osteopenia: Takes Calcium with D BID, gets regular exercise. She has been getting weight-bearing exercise through [employer]. Last DEXA 02/2015  with slight decline. T-1.7, but now the FRAX score at hip is >3%. She reports previously taking Actonel for 10 years, stopped it on her own. Doesn't want to restart meds.  Immunization History  Administered Date(s) Administered  . Influenza Whole 05/02/2010  . Influenza, High Dose Seasonal PF 05/16/2014, 04/23/2015, 05/05/2016  . Pneumococcal Conjugate-13 04/17/2014  . Pneumococcal Polysaccharide-23 09/21/2005  . Td 01/02/2002  . Tdap 01/13/2012  . Zoster 02/26/2011   Last Pap smear: 04/2015, normal no high risk HPV Last mammo 03/2017 Colonoscopy 5/08 (refuses to do again) Dexa 03/2017, T-1.8 at R fem neck.  (2016 study showed -1.7 L fem neck). Abnormal FRAX at hip, 3.3% (as 3.8% in 2016) Dentist and ophtho regularly  Exercise: She goes to [employer] 3x/week and chair yoga once weekly. Not walking as much, some short walks daily with her husband.  Vitamin D last checked 04/2015 normal at 40  Other doctors caring for patient include: Dentist: Dr. Lendon Ka Ophtho: Dr. Nile Riggs Cardiologist: Dr. Tresa Endo (no longer sees) Endocrinologist: Dr. Talmage Nap (no longer sees) Sports medicine: Dr. Terrilee Files  Depression, fall and functional status screens were all negative (just slight decrease in hearing noted, some ringing in the right ear)--see epic questionnaires. She is not interested in hearing evaluation--feels like it is mild, and not ready for any hearing aid.  End of Life Discussion: Patient hasa living will and medical power of attorney. She is interested in changing them, due to the change in cognitive status of her husband. She filled out the paperwork she was given last year, but waiting on her daughter's signature.     ROS: The patient denies anorexia, fever, weight changes, headaches, vision changes, ear pain, sore  throat, breast concerns, chest pain, palpitations, dizziness, syncope, dyspnea on exertion, cough, swelling, nausea, vomiting, diarrhea,  constipation, abdominal pain, melena, hematochezia, indigestion/heartburn, hematuria, incontinence, dysuria, vaginal bleeding, discharge, odor or itch, genital lesions, numbness, tingling, weakness, tremor, suspicious skin lesions, depression, anxiety, abnormal bleeding/bruising, or enlarged lymph nodes. Neck and back pain improved     PHYSICAL EXAM:  Wt Readings from Last 3 Encounters:  04/26/17 146 lb (66.2 kg)  03/03/17 138 lb (62.6 kg)  01/25/17 138 lb (62.6 kg)   General Appearance:  Alert, cooperative, no distress, appears stated age   Head:  Normocephalic, without obvious abnormality, atraumatic   Eyes:  PERRL, conjunctiva/corneas clear, EOM's intact, fundi benign   Ears:  Normal TM's and external ear canals   Nose:  Nares normal, mucosa normal, no drainage or sinus tenderness   Throat:  Lips and tongue normal; teeth normal, mucosa  Neck:  Supple, no lymphadenopathy; thyroid: no enlargement/tenderness/nodules; no carotid bruit or JVD   Back:  Spine nontender, no curvature, ROM normal, no CVA tenderness   Lungs:  Clear to auscultation bilaterally without wheezes, rales or ronchi; respirations unlabored   Chest Wall:  No tenderness or deformity   Heart:  Regular rate and rhythm, S1 and S2 normal, no murmur, rub or gallop   Breast Exam:  No tenderness, masses, or nipple discharge or inversion. No axillary lymphadenopathy   Abdomen:  Soft, non-tender, nondistended, normoactive bowel sounds,  no masses, no hepatosplenomegaly   Genitalia:  Normal external genitalia without lesions. Mild atrophic changes. BUS and vagina normal; no cervical motion tenderness. No abnormal vaginal discharge. Uterus and adnexa not enlarged, nontender, no masses. Polyp noted at left side of cervical os, no other lesions noted. Pap not performed   Rectal:  Normal tone, no masses or tenderness; guaiac negative stool   Extremities:  No clubbing, cyanosis or edema.   Pulses:   2+ and symmetric all extremities   Skin:  Skin color, texture, turgor normal, no rashes or lesions   Lymph nodes:  Cervical, supraclavicular, and axillary nodes normal   Neurologic:  CNII-XII intact, normal strength, sensation and gait; reflexes 2+ and symmetric throughout   Psych:  Normal mood, affect, hygiene and grooming  Pap only if larger polyp Update above     Chemistry      Component Value Date/Time   NA 137 05/05/2017 0745   K 4.3 05/05/2017 0745   CL 103 05/05/2017 0745   CO2 24 05/05/2017 0745   BUN 12 05/05/2017 0745   CREATININE 0.66 05/05/2017 0745      Component Value Date/Time   CALCIUM 9.8 05/05/2017 0745   ALKPHOS 53 04/30/2016 0813   AST 22 05/05/2017 0745   ALT 20 05/05/2017 0745   BILITOT 0.5 05/05/2017 0745     Fasting glucose 93  Lab Results  Component Value Date   WBC 3.5 (L) 05/05/2017   HGB 13.1 05/05/2017   HCT 37.9 05/05/2017   MCV 88.3 05/05/2017   PLT 353 05/05/2017   Lab Results  Component Value Date   TSH 2.59 05/05/2017   Lab Results  Component Value Date   CHOL 144 05/05/2017   HDL 70 05/05/2017   LDLCALC 87 04/30/2016   TRIG 56 05/05/2017   CHOLHDL 2.1 05/05/2017    ASSESSMENT/PLAN:  High dose flu shot Pneumovax booster   Discussed monthly self breast exams and yearly mammograms (past due and reminded to schedule); at least 30 minutes of aerobic activity at least 5 days/week and weight-bearing  exercise 2x/week; proper sunscreen use reviewed; healthy diet, including goals of calcium and vitamin D intake and alcohol recommendations (less than or equal to 1 drink/day) reviewed; regular seatbelt use; changing batteries in smoke detectors. Immunization recommendations discussed--high dose flu shot today. Colonoscopy recommendations reviewed, UTD. She refuses to have another colonoscopy.  Discussed Cologard testing (and need for colonoscopy if abnormal) and willing to proceed with this when due, next year.   DEXA due 02/2017.   Pt is full code, full care. Reminded to complete Living Will and healthcare POA and to get [place] copies. MOST form discussed and completed.     Medicare Attestation I have personally reviewed: The patient's medical and social history Their use of alcohol, tobacco or illicit drugs Their current medications and supplements The patient's functional ability including ADLs,fall risks, home safety risks, cognitive, and hearing and visual impairment Diet and physical activities Evidence for depression or mood disorders  The patient's weight, height, BMI, and visual acuity have been recorded in the chart.  I have made referrals, counseling, and provided education to the patient based on review of the above and I have provided the patient with a written personalized care plan for preventive services.

## 2017-05-11 NOTE — Progress Notes (Signed)
Chief Complaint  Patient presents with  . Medicare Wellness    nonfasting (labs done) AWV with pelvic. Will schedule eye exam with eye doctor as she is due. No concerns.     [name] is a 76 y.o. female who presents for annual physical exam, Medicare wellness visit and follow-up on chronic medical conditions.    She had shingles in January, affecting R upper face.  She has some intermittent itching at her right upper forehead. No longer has pain or tingling.  The itching had eased off, but recurred again, sometimes daily, but not always.  A little better over the last week. She is asking about the new shingles vaccine.  Hypertension follow-up: She is compliant with taking her avapro daily. Denies headaches, dizziness, chest pain, palpitations. Denies edema; denies side effects. BP's over the last 2 months have been running [id number].  It was 113/53 last night--she had taken a xanax.  Got stressed out talking to [name].  Hyperlipidemia follow-up: Patient is reportedly following a low-fat, low cholesterol diet. Compliant with medications and denies medication side effects.   Hypothyroidism: Thyroid biopsy in 2011 was benign; had seen Dr. Talmage [name] in the past, but we are now managing her medications. Denies fatigue, hair, skin, bowel changes. Denies any mood changes. Denies dysphagia or any thyroid concerns. Takes Synthroid on empty stomach, separate from food and other medications.  Insomnia: Well controlled with Seroquel; has been on it for well over 5 years.   Anxiety: She is taking10mg  of lexapro without side effects, and feels that she is doing well with respect to her moods and anxiety. She questions as to whether it is helping much (as her daughter was thinking that it wasn't, per the conversation last night that stressed her out). She uses alprazolam as needed, about once or twice a month.  She takes it when she is extremely agitated (related to issues with her husband, and  his facility, [name]). Husband has cognitive deficits, on aricept. He was recently placed into [name] memory unit (01/2017). She hasn't quite adjusted to the new routine, visits him daily, still doesn't feel like she has a lot of time for herself. She had a great granddaughter this year, locally, which makes her very happy.  She continues to see Dr. Terrilee [name] for joint pains (neck, back). She goes every few months, and this is helpful.  Osteopenia: Takes Calcium with D BID, gets regular exercise. She has been getting weight-bearing exercise through [employer].  She reports previously taking Actonel for 10 years, stopped it on her own, concerned about risks (she reports heart attacks). Doesn't want to restart meds.  Immunization History  Administered Date(s) Administered  . Influenza Whole 05/02/2010  . Influenza, High Dose Seasonal PF 05/16/2014, 04/23/2015, 05/05/2016  . Pneumococcal Conjugate-13 04/17/2014  . Pneumococcal Polysaccharide-23 09/21/2005  . Td 01/02/2002  . Tdap 01/13/2012  . Zoster 02/26/2011   Last Pap smear: 04/2015, normal no high risk HPV Last mammo 03/2017 Colonoscopy 5/08 (refuses to do again) Dexa 03/2017, T-1.8 at R fem neck.  (2016 study showed -1.7 L fem neck). Abnormal FRAX at hip, 3.3% (as 3.8% in 2016) Dentist and ophtho regularly  Exercise: She goes to [employer] [name] 3x/week and chair yoga once weekly. Vitamin D last checked 04/2015 normal at 40  Other doctors caring for patient include: Dentist: [name] Ophtho: Dr. Nile [name] Cardiologist: [name] (no longer sees) Endocrinologist: Dr. Talmage [name] (no longer sees) Sports medicine: Dr. Terrilee [name]  Depression, fall and functional status screens were all negative (just slight decrease in hearing noted, some ringing in the right ear, unchanged from last year)--see epic questionnaires. She is not interested in hearing evaluation--feels like it is mild, and not ready  for any hearing aid. Denies any change in the last year. Some vision change related to cataracts, plans for surgery. Slight leakage of urine, wears pantiliner  End of Life Discussion: Patient hasa living will and medical power of attorney. She brought in forms today to be scanned into chart.  Past Medical History:  Diagnosis Date  . Allergy   . Colon polyps    hyperplastic  . Hypercholesteremia   . Hypertension   . Insomnia   . Leukopenia   . Multinodular thyroid   . OA (osteoarthritis)    hips, knees and LS  . Osteopenia   . Psoriasis   . Shingles 08/2016   left forehead  . Vitamin D deficiency     Past Surgical History:  Procedure Laterality Date  . cardiolyte  02-2002  . COLONOSCOPY  12-2006  . FLEXIBLE SIGMOIDOSCOPY  05-2000  . HAND TENDON SURGERY  1979   L 4th finger  . KNEE SURGERY  1969   Left, cartilage  . TONSILLECTOMY AND ADENOIDECTOMY      Social History   Social History  . Marital status: Married    Spouse name: N/A  . Number of children: 2  . Years of education: N/A   Occupational History  . Admin at [name]   Social History Main Topics  . Smoking status: Never Smoker  . Smokeless tobacco: Never Used  . Alcohol use 0.0 oz/week     Comment: socially, 2 drinks twice weekly  . Drug use: No  . Sexual activity: Not Currently    Partners: Male   Other Topics Concern  . Not on file   Social History Narrative   Lives alone. Husband moved into [name] unit in 01/2017.  Son is in [place] (with 2 grandchildren there), daughter and 1 grandson and great-granddaughter are local.      Family History  Problem Relation Age of Onset  . Allergies Mother   . Hypertension Mother   . Vision loss Mother   . Heart disease Mother   . Arthritis Mother   . Arthritis Father   . Heart disease Father 18  . Hearing loss Sister   . Hypertension Sister   . Hyperlipidemia Sister   . Arthritis Sister   . Heart  disease Sister 73       massive MI  . Arthritis Sister   . Heart disease Sister   . Hyperlipidemia Sister   . Hypertension Sister   . Arthritis Sister   . Hypertension Sister   . Hyperlipidemia Sister   . Thyroid disease Sister   . Diabetes Sister 32       diet controlled  . Vision loss Sister        macular degeneration  . Stroke Maternal Grandmother   . Heart disease Maternal Grandfather   . Stroke Maternal Grandfather   . Heart disease Paternal Grandmother   . Stroke Paternal Grandmother   . Cancer Neg Hx     Outpatient Encounter Prescriptions as of 05/12/2017  Medication Sig Note  . ALPRAZolam (XANAX) 0.25 MG tablet Take 1 tablet (0.25 mg total) by mouth 3 (three) times daily as needed for anxiety. 05/12/2017: Uses prn, about 1-2x/month  . aspirin 81 MG tablet  Take 81 mg by mouth daily.     Marland Kitchen atorvastatin (LIPITOR) 20 MG tablet Take 1 tablet (20 mg total) by mouth daily.   . Calcium Carbonate-Vitamin D (CALTRATE 600+D PO) Take 1 tablet by mouth 2 (two) times daily.     . cholecalciferol (VITAMIN D) 1000 units tablet Take 1,000 Units by mouth daily.   Marland Kitchen escitalopram (LEXAPRO) 10 MG tablet Take 1 tablet (10 mg total) by mouth daily.   . irbesartan (AVAPRO) 300 MG tablet Take 1 tablet (300 mg total) by mouth daily.   . Multiple Vitamins-Minerals (CENTRUM SILVER PO) Take 1 tablet by mouth daily.     . QUEtiapine (SEROQUEL) 25 MG tablet TAKE 1 TABLET BY MOUTH AT  BEDTIME   . SYNTHROID 50 MCG tablet Take 1 tablet by mouth  daily before breakfast   . fluocinonide (LIDEX) 0.05 % gel Apply 1 application topically 4 (four) times daily. Reported on 12/12/2015 05/12/2017: Uses prn for canker sores; much better since changer her drinking water   No facility-administered encounter medications on file as of 05/12/2017.     Allergies  Allergen Reactions  . Amoxicillin Rash  . Erythromycin Rash  . Sulfa Antibiotics Rash    ROS: The patient denies anorexia, fever, weight changes,  headaches, vision changes, ear pain, sore throat, breast concerns, chest pain, palpitations, dizziness, syncope, dyspnea on exertion, cough, swelling, nausea, vomiting, diarrhea, constipation, abdominal pain, melena, hematochezia, indigestion/heartburn, hematuria, dysuria, vaginal bleeding, discharge, odor or itch, genital lesions, numbness, tingling, weakness, tremor, suspicious skin lesions, depression, anxiety, abnormal bleeding/bruising, or enlarged lymph nodes. Neck and back pain improved Slight hearing loss and ringing in right ear, unchanged. Slight leakage of urine, unchanged. Moods overall good, some anxiety/irritability  PHYSICAL EXAM:  BP 140/86 (BP Location: Left Arm, Patient Position: Sitting, Cuff Size: Normal)   Pulse 80   Ht 5' 1.5" (1.562 m)   Wt 144 lb 3.2 oz (65.4 kg)   BMI 26.81 kg/m   BP 122/80 on repeat by nurse.  Wt Readings from Last 3 Encounters:  04/26/17 146 lb (66.2 kg)  03/03/17 138 lb (62.6 kg)  01/25/17 138 lb (62.6 kg)   General Appearance:  Alert, cooperative, no distress, appears stated age. Very talkative.   Head:  Normocephalic, without obvious abnormality, atraumatic   Eyes:  PERRL, conjunctiva/corneas clear, EOM's intact, fundi benign   Ears:  Normal TM's and external ear canals   Nose:  Nares normal, mucosa normal, no drainage or sinus tenderness   Throat:  Lips and tongue normal; teeth normal, mucosa  Neck:  Supple, no lymphadenopathy; thyroid: no enlargement/tenderness/nodules; no carotid bruit or JVD   Back:  Spine nontender, no curvature, ROM normal, no CVA tenderness   Lungs:  Clear to auscultation bilaterally without wheezes, rales or ronchi; respirations unlabored   Chest Wall:  No tenderness or deformity   Heart:  Regular rate and rhythm, S1 and S2 normal, no murmur, rub or gallop   Breast Exam:  No tenderness, masses, or nipple discharge or inversion. No axillary lymphadenopathy   Abdomen:  Soft, non-tender,  nondistended, normoactive bowel sounds, no masses, no hepatosplenomegaly   Genitalia:  Normal external genitalia without lesions. Mild atrophic changes. BUS and vagina normal; no cervical motion tenderness. No abnormal vaginal discharge. Uterus and adnexa not enlarged, nontender, no masses. Speculum exam not performed, just bimanual exam. Pap not performed   Rectal:  Normal tone, no masses or tenderness; guaiac negative stool   Extremities:  No clubbing, cyanosis or edema.  Pulses:  2+ and symmetric all extremities   Skin:  Skin color, texture, turgor normal, no rashes or lesions   Lymph nodes:  Cervical, supraclavicular, and axillary nodes normal   Neurologic:  CNII-XII intact, normal strength, sensation and gait; reflexes 2+ and symmetric throughout   Psych:  Normal mood, affect, hygiene and grooming     Chemistry      Component Value Date/Time   NA 137 05/05/2017 0745   K 4.3 05/05/2017 0745   CL 103 05/05/2017 0745   CO2 24 05/05/2017 0745   BUN 12 05/05/2017 0745   CREATININE 0.66 05/05/2017 0745      Component Value Date/Time   CALCIUM 9.8 05/05/2017 0745   ALKPHOS 53 04/30/2016 0813   AST 22 05/05/2017 0745   ALT 20 05/05/2017 0745   BILITOT 0.5 05/05/2017 0745     Fasting glucose 93  Lab Results  Component Value Date   WBC 3.5 (L) 05/05/2017   HGB 13.1 05/05/2017   HCT 37.9 05/05/2017   MCV 88.3 05/05/2017   PLT 353 05/05/2017   Lab Results  Component Value Date   TSH 2.59 05/05/2017   Lab Results  Component Value Date   CHOL 144 05/05/2017   HDL 70 05/05/2017   LDLCALC 87 04/30/2016   TRIG 56 05/05/2017   CHOLHDL 2.1 05/05/2017    ASSESSMENT/PLAN:  Annual physical exam - Plan: POCT Urinalysis DIP (Proadvantage Device)  Medicare annual wellness visit, subsequent  Essential hypertension, benign - controlled; some elevations related to anxiety. Discussed relaxation techniques. Continue avapro  Pure hypercholesterolemia -  lipids at goal - Plan: atorvastatin (LIPITOR) 20 MG tablet  Generalized anxiety disorder - continues to have some anxiety/stress, manageable. Continue Lexapro. Reviewed relaxation techniques. xanax sparingly only  Insomnia, unspecified type - well controlled with nightly seroquel  Hypothyroidism, unspecified type - euthyroid by history; normal thyroid on exam. Continue current dose - Plan: SYNTHROID 50 MCG tablet  Needs flu shot - Plan: Flu vaccine HIGH DOSE PF (Fluzone High Dose)   High dose flu shot given Pneumovax booster recommended--declines getting both shots today. Will do at a subsequent visit Cologard referral   Discussed monthly self breast exams and yearly mammograms; at least 30 minutes of aerobic activity at least 5 days/week and weight-bearing exercise 2x/week; proper sunscreen use reviewed; healthy diet, including goals of calcium and vitamin D intake and alcohol recommendations (less than or equal to 1 drink/day) reviewed; regular seatbelt use; changing batteries in smoke detectors. Immunization recommendations discussed--high dose flu shot today. Pneumovax offered/declined.  do at f/u. Shingrix next year from pharmacy. Colonoscopy recommendations reviewed--she refuses to have another colonoscopy.  Refer for Cologard.   Pt is full code, full care. MOST form reviewed and updated  F/u 6 months med check--no labs needed at that visit unless symptoms (nonfasting) Pneumovax at that visit.  F/u 1 year for CPE/AWV (fasting labs prior)   Medicare Attestation I have personally reviewed: The patient's medical and social history Their use of alcohol, tobacco or illicit drugs Their current medications and supplements The patient's functional ability including ADLs,fall risks, home safety risks, cognitive, and hearing and visual impairment Diet and physical activities Evidence for depression or mood disorders  The patient's weight, height, and BMI have been recorded in the  chart.  I have made referrals, counseling, and provided education to the patient based on review of the above and I have provided the patient with a written personalized care plan for preventive services.

## 2017-05-12 ENCOUNTER — Telehealth: Payer: Self-pay | Admitting: Family Medicine

## 2017-05-12 ENCOUNTER — Ambulatory Visit (INDEPENDENT_AMBULATORY_CARE_PROVIDER_SITE_OTHER): Payer: Medicare Other | Admitting: Family Medicine

## 2017-05-12 ENCOUNTER — Encounter: Payer: Self-pay | Admitting: Family Medicine

## 2017-05-12 VITALS — BP 122/80 | HR 80 | Ht 61.5 in | Wt 144.2 lb

## 2017-05-12 DIAGNOSIS — E7800 Pure hypercholesterolemia, unspecified: Secondary | ICD-10-CM | POA: Diagnosis not present

## 2017-05-12 DIAGNOSIS — I10 Essential (primary) hypertension: Secondary | ICD-10-CM | POA: Diagnosis not present

## 2017-05-12 DIAGNOSIS — Z0000 Encounter for general adult medical examination without abnormal findings: Secondary | ICD-10-CM | POA: Diagnosis not present

## 2017-05-12 DIAGNOSIS — E039 Hypothyroidism, unspecified: Secondary | ICD-10-CM

## 2017-05-12 DIAGNOSIS — Z5181 Encounter for therapeutic drug level monitoring: Secondary | ICD-10-CM | POA: Diagnosis not present

## 2017-05-12 DIAGNOSIS — G4700 Insomnia, unspecified: Secondary | ICD-10-CM

## 2017-05-12 DIAGNOSIS — Z23 Encounter for immunization: Secondary | ICD-10-CM

## 2017-05-12 DIAGNOSIS — F411 Generalized anxiety disorder: Secondary | ICD-10-CM | POA: Diagnosis not present

## 2017-05-12 LAB — POCT URINALYSIS DIP (PROADVANTAGE DEVICE)
BILIRUBIN UA: NEGATIVE mg/dL
Bilirubin, UA: NEGATIVE
Blood, UA: NEGATIVE
Glucose, UA: NEGATIVE mg/dL
LEUKOCYTES UA: NEGATIVE
NITRITE UA: NEGATIVE
PH UA: 7.5 (ref 5.0–8.0)
PROTEIN UA: NEGATIVE mg/dL
Specific Gravity, Urine: 1.01
UUROB: NEGATIVE

## 2017-05-12 MED ORDER — ATORVASTATIN CALCIUM 20 MG PO TABS: 20.0000 mg | ORAL_TABLET | Freq: Every day | ORAL | 3 refills | Status: DC

## 2017-05-12 MED ORDER — SYNTHROID 50 MCG PO TABS: ORAL_TABLET | ORAL | 3 refills | Status: DC

## 2017-05-12 NOTE — Telephone Encounter (Signed)
Orders entered

## 2017-05-12 NOTE — Patient Instructions (Addendum)
HEALTH MAINTENANCE RECOMMENDATIONS:  It is recommended that you get at least 30 minutes of aerobic exercise at least 5 days/week (for weight loss, you may need as much as 60-90 minutes). This can be any activity that gets your heart rate up. This can be divided in 10-15 minute intervals if needed, but try and build up your endurance at least once a week.  Weight bearing exercise is also recommended twice weekly.  Eat a healthy diet with lots of vegetables, fruits and fiber.  "Colorful" foods have a lot of vitamins (ie green vegetables, tomatoes, red peppers, etc).  Limit sweet tea, regular sodas and alcoholic beverages, all of which has a lot of calories and sugar.  Up to 1 alcoholic drink daily may be beneficial for women (unless trying to lose weight, watch sugars).  Drink a lot of water.  Calcium recommendations are 1200-1500 mg daily (1500 mg for postmenopausal women or women without ovaries), and vitamin D 1000 IU daily.  This should be obtained from diet and/or supplements (vitamins), and calcium should not be taken all at once, but in divided doses.  Monthly self breast exams and yearly mammograms for women over the age of 15 is recommended.  Sunscreen of at least SPF 30 should be used on all sun-exposed parts of the skin when outside between the hours of 10 am and 4 pm (not just when at beach or pool, but even with exercise, golf, tennis, and yard work!)  Use a sunscreen that says "broad spectrum" so it covers both UVA and UVB rays, and make sure to reapply every 1-2 hours.  Remember to change the batteries in your smoke detectors when changing your clock times in the spring and fall.  Use your seat belt every time you are in a car, and please drive safely and not be distracted with cell phones and texting while driving.   Lisa [name] , Thank you for taking time to come for your Medicare Wellness Visit. I appreciate your ongoing commitment to your health goals. Please review the  following plan we discussed and let me know if I can assist you in the future.   These are the goals we discussed: Goals    None      This is a list of the screening recommended for you and due dates:  Health Maintenance  Topic Date Due  . Flu Shot  03/09/2017  . Tetanus Vaccine  01/12/2022  . DEXA scan (bone density measurement)  Completed  . Pneumonia vaccines  Completed   You were given a flu shot today. Your next bone density test will be due in 03/2019 (2 year follow-up). Your next mammogram is due 03/2018. I recommend a pneumovax booster--we will give this at your next visit. We are referring you for Cologard testing as screening for colon cancer.  If this comes back abnormal, you will be referred to a gastroenterologist for colonoscopy.  If it is normal, we will repeat Cologard testing every 3 years.  I recommend getting the new shingles vaccine (Shingrix). You will need to check with your insurance to see if it is covered, and if covered by Medicare Part D, you need to get from the pharmacy rather than our office.  It is a series of 2 injections, spaced 2 months apart. Since you had shingles in Jan/February of this year, there isn't a rush. Wait until next year. There is currently a shortage and it isn't readily available at the pharmacy now anyway.

## 2017-05-12 NOTE — Telephone Encounter (Signed)
Pt scheduled her CPE,AWV for next year on 05/18/18. She would like to come in for labs prior to the appointment on 05/16/18. Is this ok?

## 2017-05-23 ENCOUNTER — Telehealth: Payer: Self-pay | Admitting: Family Medicine

## 2017-05-23 DIAGNOSIS — I10 Essential (primary) hypertension: Secondary | ICD-10-CM

## 2017-05-23 MED ORDER — IRBESARTAN 300 MG PO TABS
300.0000 mg | ORAL_TABLET | Freq: Every day | ORAL | 0 refills | Status: DC
Start: 2017-05-23 — End: 2017-08-19

## 2017-05-23 NOTE — Telephone Encounter (Signed)
Recv'd message from [facility] needing refill Avapro 300 mg 1 qd #90 ref# [id number]

## 2017-05-24 DIAGNOSIS — Z1211 Encounter for screening for malignant neoplasm of colon: Secondary | ICD-10-CM | POA: Diagnosis not present

## 2017-05-24 DIAGNOSIS — Z1212 Encounter for screening for malignant neoplasm of rectum: Secondary | ICD-10-CM | POA: Diagnosis not present

## 2017-06-02 LAB — COLOGUARD: Cologuard: NEGATIVE

## 2017-06-09 ENCOUNTER — Encounter: Payer: Self-pay | Admitting: *Deleted

## 2017-06-11 ENCOUNTER — Other Ambulatory Visit: Payer: Self-pay | Admitting: Family Medicine

## 2017-06-11 DIAGNOSIS — I10 Essential (primary) hypertension: Secondary | ICD-10-CM

## 2017-06-12 ENCOUNTER — Other Ambulatory Visit: Payer: Self-pay | Admitting: Family Medicine

## 2017-06-12 DIAGNOSIS — G4700 Insomnia, unspecified: Secondary | ICD-10-CM

## 2017-06-13 NOTE — Telephone Encounter (Signed)
Is this okay to refill? 

## 2017-06-15 ENCOUNTER — Other Ambulatory Visit: Payer: Self-pay | Admitting: Family Medicine

## 2017-06-15 NOTE — Telephone Encounter (Signed)
Last filled #10 on 9/4. Okay to refill #10.

## 2017-06-15 NOTE — Telephone Encounter (Signed)
Is this okay to call in? 

## 2017-06-28 ENCOUNTER — Ambulatory Visit: Payer: Medicare Other | Admitting: Family Medicine

## 2017-07-07 ENCOUNTER — Ambulatory Visit: Payer: Medicare Other | Admitting: Family Medicine

## 2017-07-07 ENCOUNTER — Encounter: Payer: Self-pay | Admitting: Family Medicine

## 2017-07-07 VITALS — BP 122/82 | HR 80 | Ht 60.0 in | Wt 143.0 lb

## 2017-07-07 DIAGNOSIS — M999 Biomechanical lesion, unspecified: Secondary | ICD-10-CM

## 2017-07-07 DIAGNOSIS — M9981 Other biomechanical lesions of cervical region: Secondary | ICD-10-CM

## 2017-07-07 DIAGNOSIS — M542 Cervicalgia: Secondary | ICD-10-CM | POA: Diagnosis not present

## 2017-07-07 NOTE — Progress Notes (Signed)
Tawana ScaleZach Smith D.O. [facility] [address] [place], [place] [address] Phone: [phone] Subjective:    I'm seeing this patient by the request  of:    CC: Back pain follow-up  BJY:NWGNFAOZHYHPI:Subjective  Lisa [name] is a 76 y.o. female coming in for back pain. Her shoulders and traps get tight. 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   She also notes that her left knee was bothering her over thanksgiving break but after using ice and IBU.  Overall patient thinks she is doing relatively well.  Past Medical History:  Diagnosis Date  . Allergy   . Colon polyps    hyperplastic  . Hypercholesteremia   . Hypertension   . Insomnia   . Leukopenia   . Multinodular thyroid   . OA (osteoarthritis)    hips, knees and LS  . Osteopenia   . Psoriasis   . Shingles 08/2016   left forehead  . Vitamin D deficiency    Past Surgical History:  Procedure Laterality Date  . cardiolyte  02-2002  . COLONOSCOPY  12-2006  . FLEXIBLE SIGMOIDOSCOPY  05-2000  . HAND TENDON SURGERY  1979   L 4th finger  . KNEE SURGERY  1969   Left, cartilage  . TONSILLECTOMY AND ADENOIDECTOMY     Social History   Socioeconomic History  . Marital status: Married    Spouse name: None  . Number of children: 2  . Years of education: None  . Highest education level: None  Social Needs  . Financial resource strain: None  . Food insecurity - worry: None  . Food insecurity - inability: None  . Transportation needs - medical: None  . Transportation needs - non-medical: None  Occupational History  . 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 Smoking status: Never Smoker  . Smokeless tobacco: Never Used  Substance and Sexual Activity  . Alcohol use: Yes    Alcohol/week: 0.0 oz    Comment: socially, 2 drinks twice weekly  . Drug use: No  . Sexual activity: Not Currently   Partners: Male  Other Topics Concern  . None  Social History Narrative   Lives alone. [facility].  [place] in [place] (with 2 grandchildren there), daughter and 1 grandson and great-granddaughter are local.     Allergies  Allergen Reactions  . Amoxicillin Rash  . Erythromycin Rash  . Sulfa Antibiotics Rash   Family History  Problem Relation Age of Onset  . Allergies Mother   . Hypertension Mother   . Vision loss Mother   . Heart disease Mother   . Arthritis Mother   . Arthritis Father   . Heart disease Father [id number]  . Hearing loss Sister   . Hypertension Sister   . Hyperlipidemia Sister   . Arthritis Sister   . Heart disease Sister 288       massive MI  . Arthritis Sister   . Heart disease Sister   . Hyperlipidemia Sister   . Hypertension Sister   . Arthritis Sister   . Hypertension Sister   . Hyperlipidemia Sister   . Thyroid disease Sister   . Diabetes Sister 2172       diet controlled  . Vision loss Sister        macular degeneration  . Stroke Maternal  Grandmother   . Heart disease Maternal Grandfather   . Stroke Maternal Grandfather   . Heart disease Paternal Grandmother   . Stroke Paternal Grandmother   . Cancer Neg Hx      Past medical history, social, surgical and family history all reviewed in electronic medical record.  No pertanent information unless stated regarding to the chief complaint.   Review of Systems:Review of systems updated and as accurate as of 07/07/17  No headache, visual changes, nausea, vomiting, diarrhea, constipation, dizziness, abdominal pain, skin rash, fevers, chills, night sweats, weight loss, swollen lymph nodes, body aches, joint swelling,  chest pain, shortness of breath, mood changes.  Positive muscle aches  Objective  Blood pressure 122/82, pulse 80, height 5' (1.524 m), weight 143 lb (64.9 kg), SpO2 98 %. Systems examined below as of 07/07/17   General: No apparent distress alert  and oriented x3 mood and affect normal, dressed appropriately.  HEENT: Pupils equal, extraocular movements intact  Respiratory: Patient's speak in full sentences and does not appear short of breath  Cardiovascular: No lower extremity edema, non tender, no erythema  Skin: Warm dry intact with no signs of infection or rash on extremities or on axial skeleton.  Abdomen: Soft nontender  Neuro: Cranial nerves II through XII are intact, neurovascularly intact in all extremities with 2+ DTRs and 2+ pulses.  Lymph: No lymphadenopathy of posterior or anterior cervical chain or axillae bilaterally.  Gait normal with good balance and coordination.  MSK:  Non tender with full range of motion and good stability and symmetric strength and tone of shoulders, elbows, wrist, hip, knee and ankles bilaterally.  Arthritic changes in multiple joints  Back exam still shows some degenerative scoliosis.  Some mild increase in tightness lacking an extra 5 degrees of extension.  Patient has a negative straight leg test but positive tightness of the hamstrings bilaterally.  Mild discomfort in the paraspinal musculature of the lumbar spine left greater than right.  Tightness of the neck.  Mild limitation in extension by 5 actually degrees.  Osteopathic findings C2 flexed rotated and side bent right C4 flexed rotated and side bent left C6 flexed rotated and side bent left T3 extended rotated and side bent right inhaled third rib T9 extended rotated and side bent left      Impression and Recommendations:     This case required medical decision making of moderate complexity.      Note: This dictation was prepared with Dragon dictation along with smaller phrase technology. Any transcriptional errors that result from this process are unintentional.

## 2017-07-07 NOTE — Patient Instructions (Signed)
So good to see you  Ice is your friend.  You are doing great  Continue the vitamins See me again in 2 months

## 2017-07-07 NOTE — Assessment & Plan Note (Signed)
She was to increase activity slowly over the course the next several days.  We discussed icing regimen and home exercises.  We discussed posture and ergonomics again.  Has been doing very well.  No change in medications.  Follow-up again in 2 months

## 2017-07-07 NOTE — Assessment & Plan Note (Signed)
Decision today to treat with OMT was based on Physical Exam  After verbal consent patient was treated with HVLA, ME, FPR techniques in cervical, thoracic, rib, areas  Patient tolerated the procedure well with improvement in symptoms  Patient given exercises, stretches and lifestyle modifications  See medications in patient instructions if given  Patient will follow up in 8 weeks 

## 2017-08-19 ENCOUNTER — Other Ambulatory Visit: Payer: Self-pay | Admitting: Family Medicine

## 2017-08-19 ENCOUNTER — Telehealth: Payer: Self-pay | Admitting: Family Medicine

## 2017-08-19 DIAGNOSIS — I10 Essential (primary) hypertension: Secondary | ICD-10-CM

## 2017-08-19 MED ORDER — IRBESARTAN 300 MG PO TABS: 300.0000 mg | ORAL_TABLET | Freq: Every day | ORAL | 0 refills | Status: DC

## 2017-08-19 NOTE — Telephone Encounter (Signed)
Can pt have a refill on this 

## 2017-08-19 NOTE — Telephone Encounter (Signed)
Rcvd refill request for Avapro 300 mg from [facility]

## 2017-08-22 MED ORDER — IRBESARTAN 300 MG PO TABS: 300.0000 mg | ORAL_TABLET | Freq: Every day | ORAL | 0 refills | Status: DC

## 2017-08-22 NOTE — Addendum Note (Signed)
Addended by: Debbrah AlarSMITH, Adelfa Lozito F on: 08/22/2017 08:36 AM   Modules accepted: Orders

## 2017-08-22 NOTE — Telephone Encounter (Signed)
Done

## 2017-09-07 NOTE — Progress Notes (Signed)
[name] [address] [place], [place] [address] Phone: [phone] Subjective:     CC: Back and neck pain follow-up  BJY:NWGNFAOZHY  Lisa [name] is a 77 y.o. female coming in with complaint of back and neck pain follow-up.  Patient has known degenerative disc disease and osteopenia.  Has been doing well with conservative therapy.  We have attempted osteopathic manipulation.  Patient states that she had a massage today and that she is doing fine. She is here for OMT.       Past Medical History:  Diagnosis Date  . Allergy   . Colon polyps    hyperplastic  . Hypercholesteremia   . Hypertension   . Insomnia   . Leukopenia   . Multinodular thyroid   . OA (osteoarthritis)    hips, knees and LS  . Osteopenia   . Psoriasis   . Shingles 08/2016   left forehead  . Vitamin D deficiency    Past Surgical History:  Procedure Laterality Date  . cardiolyte  02-2002  . COLONOSCOPY  12-2006  . FLEXIBLE SIGMOIDOSCOPY  05-2000  . HAND TENDON SURGERY  1979   L 4th finger  . KNEE SURGERY  1969   Left, cartilage  . TONSILLECTOMY AND ADENOIDECTOMY     Social History   Socioeconomic History  . Marital status: Married    Spouse name: None  . Number of children: 2  . Years of education: None  . Highest education level: None  Social Needs  . Financial resource strain: None  . Food insecurity - worry: None  . Food insecurity - inability: None  . Transportation needs - medical: None  . Transportation needs - non-medical: None  Occupational History  . 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 Smoking status: Never Smoker  . Smokeless tobacco: Never Used  Substance and Sexual Activity  . Alcohol use: Yes    Alcohol/week: 0.0 oz    Comment: socially, 2 drinks twice weekly  . Drug use: No  . Sexual activity: Not Currently    Partners: Male  Other Topics Concern  . None  Social History  Narrative   Lives alone. Husband moved into [facility] unit in 01/[facility].  Son is in [place] (with 2 grandchildren there), daughter and 1 grandson and great-granddaughter are local.     Allergies  Allergen Reactions  . Amoxicillin Rash  . Erythromycin Rash  . Sulfa Antibiotics Rash   Family History  Problem Relation Age of Onset  . Allergies Mother   . Hypertension Mother   . Vision loss Mother   . Heart disease Mother   . Arthritis Mother   . Arthritis Father   . Heart disease Father 66  . Hearing loss Sister   . Hypertension Sister   . Hyperlipidemia Sister   . Arthritis Sister   . Heart disease Sister 40       massive MI  . Arthritis Sister   . Heart disease Sister   . Hyperlipidemia Sister   . Hypertension Sister   . Arthritis Sister   . Hypertension Sister   . Hyperlipidemia Sister   . Thyroid disease Sister   . Diabetes Sister 51       diet controlled  . Vision loss Sister        macular degeneration  . Stroke Maternal Grandmother   . Heart disease Maternal Grandfather   . Stroke  Maternal Grandfather   . Heart disease Paternal Grandmother   . Stroke Paternal Grandmother   . Cancer Neg Hx      Past medical history, social, surgical and family history all reviewed in electronic medical record.  No pertanent information unless stated regarding to the chief complaint.   Review of Systems:Review of systems updated and as accurate as of 09/08/17  No headache, visual changes, nausea, vomiting, diarrhea, constipation, dizziness, abdominal pain, skin rash, fevers, chills, night sweats, weight loss, swollen lymph nodes, body aches, joint swelling,chest pain, shortness of breath, mood changes.  Positive muscle aches  Objective  Blood pressure 128/86, pulse 69, height 5\' 3"  (1.6 m), weight 143 lb (64.9 kg), SpO2 97 %. Systems examined below as of 09/08/17   General: No apparent distress alert and oriented x3 mood and affect normal, dressed appropriately.    HEENT: Pupils equal, extraocular movements intact  Respiratory: Patient's speak in full sentences and does not appear short of breath  Cardiovascular: No lower extremity edema, non tender, no erythema  Skin: Warm dry intact with no signs of infection or rash on extremities or on axial skeleton.  Abdomen: Soft nontender  Neuro: Cranial nerves II through XII are intact, neurovascularly intact in all extremities with 2+ DTRs and 2+ pulses.  Lymph: No lymphadenopathy of posterior or anterior cervical chain or axillae bilaterally.  Gait normal with good balance and coordination.  MSK:  Non tender with full range of motion and good stability and symmetric strength and tone of shoulders, elbows, wrist, hip, knee and ankles bilaterally.  Arthritic changes  Neck exam shows some decreased range of motion with crepitus but negative Spurling's. Back Exam:  Inspection: Degenerative scoliosis noted Motion: Flexion 25 deg, Extension 35 deg, Side Bending to 35 deg bilaterally,  Rotation to 35 deg bilaterally  SLR laying: Negative  XSLR laying: Negative  Palpable tenderness: Tender to palpation of the paraspinal musculature. Marland Kitchen. FABER: negative. Sensory change: Gross sensation intact to all lumbar and sacral dermatomes.  Reflexes: 2+ at both patellar tendons, 2+ at achilles tendons, Babinski's downgoing.  Strength at foot  Plantar-flexion: 5/5 Dorsi-flexion: 5/5 Eversion: 5/5 Inversion: 5/5  Leg strength  Quad: 5/5 Hamstring: 5/5 Hip flexor: 5/5 Hip abductors: 5/5  Gait unremarkable. \  Osteopathic findings C6 flexed rotated and side bent left T3 extended rotated and side bent right inhaled third rib T6 extended rotated and side bent left L3 flexed rotated and side bent right Sacrum right on right    Impression and Recommendations:     This case required medical decision making of moderate complexity.      Note: This dictation was prepared with Dragon dictation along with smaller phrase  technology. Any transcriptional errors that result from this process are unintentional.

## 2017-09-08 ENCOUNTER — Ambulatory Visit (INDEPENDENT_AMBULATORY_CARE_PROVIDER_SITE_OTHER)
Admission: RE | Admit: 2017-09-08 | Discharge: 2017-09-08 | Disposition: A | Discharge status: Home / Self Care | Payer: Medicare Other | Source: Ambulatory Visit | Attending: Family Medicine | Admitting: Family Medicine

## 2017-09-08 ENCOUNTER — Encounter: Payer: Self-pay | Admitting: Family Medicine

## 2017-09-08 ENCOUNTER — Ambulatory Visit: Payer: Medicare Other | Admitting: Family Medicine

## 2017-09-08 VITALS — BP 128/86 | HR 69 | Ht 63.0 in | Wt 143.0 lb

## 2017-09-08 DIAGNOSIS — M9901 Segmental and somatic dysfunction of cervical region: Secondary | ICD-10-CM | POA: Diagnosis not present

## 2017-09-08 DIAGNOSIS — M25562 Pain in left knee: Secondary | ICD-10-CM

## 2017-09-08 DIAGNOSIS — M542 Cervicalgia: Secondary | ICD-10-CM | POA: Diagnosis not present

## 2017-09-08 DIAGNOSIS — M179 Osteoarthritis of knee, unspecified: Secondary | ICD-10-CM | POA: Diagnosis not present

## 2017-09-08 DIAGNOSIS — M999 Biomechanical lesion, unspecified: Secondary | ICD-10-CM | POA: Diagnosis not present

## 2017-09-08 DIAGNOSIS — G8929 Other chronic pain: Secondary | ICD-10-CM

## 2017-09-08 NOTE — Assessment & Plan Note (Signed)
Decision today to treat with OMT was based on Physical Exam  After verbal consent patient was treated with HVLA, ME, FPR techniques in cervical, thoracic, lumbar and sacral areas  Patient tolerated the procedure well with improvement in symptoms  Patient given exercises, stretches and lifestyle modifications  See medications in patient instructions if given  Patient will follow up in 1-2 weeks 

## 2017-09-08 NOTE — Patient Instructions (Signed)
Good to see you  I am impressed Lets not change a thing CBD oil only oral if you want to try  See me again in 10-12 weeks!

## 2017-09-08 NOTE — Assessment & Plan Note (Signed)
Patient is having significant tightness still but is doing relatively well with conservative therapy.  12 weeks follow up

## 2017-09-16 DIAGNOSIS — H903 Sensorineural hearing loss, bilateral: Secondary | ICD-10-CM | POA: Diagnosis not present

## 2017-11-08 NOTE — Progress Notes (Signed)
Chief Complaint  Patient presents with  . Hypothyroidism    nonfasting med check.    She reports "a touch of vertigo", slight dizziness in the last couple of weeks.  She has been having allergies flare some.  She has been taking OTC Allegra for the last few weeks.  She doesn't know which dose she has, takes 1/day. She starts taking this prior to allergies really flaring badly, and usually works pretty well.  She just has to be more careful/slow, but hasn't had any falls or significant vertigo.  Denies headaches or other neuro symptoms.  Hypertension follow-up: She is compliant with taking her avapro 300mg  daily. Deniesheadaches, dizziness, chest pain, palpitations. Denies edema; denies side effects. BP's have been running 128-138/64-86 (mostly 134/75), looking at the few values over the last 2-3 months.  Going back to October, ranged from 113-156/53-87. Pulse 68-80.  Hyperlipidemia follow-up: Patient is reportedly following a low-fat, low cholesterol diet. Compliant with medications and denies medication side effects.  Lab Results  Component Value Date   CHOL 144 05/05/2017   HDL 70 05/05/2017   LDLCALC 60 05/05/2017   TRIG 56 05/05/2017   CHOLHDL 2.1 05/05/2017    Hypothyroidism: Thyroid biopsy in 2011 was benign; had seen Dr. Talmage Nap in the past, but we are now managing her medications. Denies fatigue, hair, skin, bowel changes. Denies any mood changes. Denies dysphagia or any thyroid concerns. Takes Synthroid on empty stomach, separate from food and other medications. Lab Results  Component Value Date   TSH 2.59 05/05/2017   Insomnia: Well controlled with Seroquel; has been on it for well over 5 years.   Anxiety: She is taking10mg  of lexapro without side effects, and feels that she is doing well with respect to her moods and anxiety.  She uses alprazolam as needed, about once or twice a month, or less. She cannot recall the last time she took it.  She takes it when she is  extremely agitated (related to issues with her husband, and his facility, [facility]). Husband has cognitive deficits, on aricept. He lives at [facility] memory unit (01/2017). They mentioned increasing his level of care. She is worried about costs, and also related to ER visit and EMT transports after falls. She cut back to visiting him just every other day, and feels better about this.  She gets to see her great granddaughter about 1-2x/week, which makes her very happy. She is now 29 months old.  She continues to see Dr. Terrilee Files for joint pains (neck, back). She goes every few months, and this is helpful. She has been getting monthly massages for herself, which she likes, and is helpful (since she turned 75).  Osteopenia: Takes Calcium with D BID, gets regular exercise. She has been getting weight-bearing exercise through [employer].  She reports previously taking Actonel for 10 years, stopped it on her own, concerned about risks (she reports heart attacks). Last DEXA 03/2017 T-1.8 at R fem neck  PMH, PSH, SH reviewed  Outpatient Encounter Medications as of 11/10/2017  Medication Sig Note  . aspirin 81 MG tablet Take 81 mg by mouth daily.     Marland Kitchen atorvastatin (LIPITOR) 20 MG tablet Take 1 tablet (20 mg total) by mouth daily.   . Calcium Carbonate-Vitamin D (CALTRATE 600+D PO) Take 1 tablet by mouth 2 (two) times daily.     . cholecalciferol (VITAMIN D) 1000 units tablet Take 1,000 Units by mouth daily.   Marland Kitchen escitalopram (LEXAPRO) 10 MG tablet Take  1 tablet (10 mg total) by mouth daily.   . irbesartan (AVAPRO) 300 MG tablet Take 1 tablet (300 mg total) by mouth daily.   . Multiple Vitamins-Minerals (CENTRUM SILVER PO) Take 1 tablet by mouth daily.     . QUEtiapine (SEROQUEL) 25 MG tablet TAKE 1 TABLET BY MOUTH AT  BEDTIME   . SYNTHROID 50 MCG tablet Take 1 tablet by mouth  daily before breakfast   . [DISCONTINUED] escitalopram (LEXAPRO) 10 MG tablet TAKE 1 TABLET BY MOUTH  DAILY    . [DISCONTINUED] irbesartan (AVAPRO) 300 MG tablet Take 1 tablet (300 mg total) by mouth daily.   Marland Kitchen. ALPRAZolam (XANAX) 0.25 MG tablet TAKE 1 TABLET BY MOUTH 3 TIMES A DAY AS NEEDED FOR ANXIETY (Patient not taking: Reported on 11/10/2017)   . fluocinonide (LIDEX) 0.05 % gel Apply 1 application topically 4 (four) times daily. Reported on 12/12/2015 05/12/2017: Uses prn for canker sores; much better since changer her drinking water   No facility-administered encounter medications on file as of 11/10/2017.    Allergies  Allergen Reactions  . Amoxicillin Rash  . Erythromycin Rash  . Sulfa Antibiotics Rash   ROS:  No fever, chills, URI symptoms.  Mild allergies, slight vertigo/dizziness just recently, per HPI. No headaches, chest pain, palpitations, shortness of breath, edema. No GI or GU complaints. No bleeding, rashes.  Joint pains are improved with massage and seeing Dr. Katrinka BlazingSmith. Moods are good, per HPI.   PHYSICAL EXAM:  BP 124/72   Pulse 88   Ht 5\' 3"  (1.6 m)   Wt 143 lb 12.8 oz (65.2 kg)   BMI 25.47 kg/m   Wt Readings from Last 3 Encounters:  11/10/17 143 lb 12.8 oz (65.2 kg)  09/08/17 143 lb (64.9 kg)  07/07/17 143 lb (64.9 kg)   Well developed, pleasant, talkative female in no distress. She is in good spirits HEENT: PERRL, EOMI, conjunctiva and sclera are clear. TM's and EAC's normal. Nasal mucosa is mildly edematous, pale, slight clear mucus noted. OP is clear. Neck: no lymphadenopathy, thyromegaly or mass, no bruit Heart: regular rate and rhythm, no murmur Lungs: clear bilaterally Abdomen: soft, nontender, no mass Extremities: no edema, normal pulses Psych: normal mood, affect, hygiene and grooming, normal eye contact, speech.  Neuro: alert and oriented, cranial nerves intact, normal gait Skin: normal turgor, no rash   ASSESSMENT/PLAN:  Essential hypertension, benign - well controlled on avapro - Plan: irbesartan (AVAPRO) 300 MG tablet  Pure hypercholesterolemia - at goal  per last check; continue statin and low cholesterol diet  Generalized anxiety disorder - well controlled with lexapro.  rarely needs alprazolam (still has some) - Plan: escitalopram (LEXAPRO) 10 MG tablet  Hypothyroidism, unspecified type - euthyroid by history, normal TSH on last check. Cont current thyroid dose  Insomnia, unspecified type - doing well on seroquel, without side effects  Seasonal allergic rhinitis, unspecified trigger - discussed OTC antihistamines, nasal steroid sprays if pills ineffective. f/u if worsening vertigo (suspect related to allergies)  Osteopenia, unspecified location - discussed need for continuing Ca, D, weight-bearing exercise. [facility]    F/u 05/2018 as scheduled, with labs prior (orders already in system)

## 2017-11-10 ENCOUNTER — Encounter: Payer: Self-pay | Admitting: Family Medicine

## 2017-11-10 ENCOUNTER — Ambulatory Visit: Payer: Medicare Other | Admitting: Family Medicine

## 2017-11-10 VITALS — BP 124/72 | HR 88 | Ht 63.0 in | Wt 143.8 lb

## 2017-11-10 DIAGNOSIS — E7800 Pure hypercholesterolemia, unspecified: Secondary | ICD-10-CM

## 2017-11-10 DIAGNOSIS — J302 Other seasonal allergic rhinitis: Secondary | ICD-10-CM | POA: Diagnosis not present

## 2017-11-10 DIAGNOSIS — E039 Hypothyroidism, unspecified: Secondary | ICD-10-CM

## 2017-11-10 DIAGNOSIS — M8580 Other specified disorders of bone density and structure, unspecified site: Secondary | ICD-10-CM

## 2017-11-10 DIAGNOSIS — I10 Essential (primary) hypertension: Secondary | ICD-10-CM

## 2017-11-10 DIAGNOSIS — F411 Generalized anxiety disorder: Secondary | ICD-10-CM

## 2017-11-10 DIAGNOSIS — G4700 Insomnia, unspecified: Secondary | ICD-10-CM | POA: Diagnosis not present

## 2017-11-10 MED ORDER — ESCITALOPRAM OXALATE 10 MG PO TABS: 10.0000 mg | ORAL_TABLET | Freq: Every day | ORAL | 1 refills | Status: DC

## 2017-11-10 MED ORDER — IRBESARTAN 300 MG PO TABS: 300.0000 mg | ORAL_TABLET | Freq: Every day | ORAL | 1 refills | Status: DC

## 2017-11-10 NOTE — Patient Instructions (Signed)
Your blood pressure was good. Continue all of your current medications. Look at your fexofenadine. If it is 180mg , it can only be taken once daily. If you have the 60mg , that is intended to be taken twice daily. If you are still having allergy symptoms despite taking the allegra/fexofenadine, then consider adding a nasal spray such as Flonase.

## 2017-11-24 ENCOUNTER — Ambulatory Visit: Payer: Medicare Other | Admitting: Family Medicine

## 2017-11-24 ENCOUNTER — Encounter: Payer: Self-pay | Admitting: Family Medicine

## 2017-11-24 VITALS — BP 122/84 | HR 83 | Ht 63.0 in | Wt 144.0 lb

## 2017-11-24 DIAGNOSIS — M1712 Unilateral primary osteoarthritis, left knee: Secondary | ICD-10-CM

## 2017-11-24 DIAGNOSIS — M542 Cervicalgia: Secondary | ICD-10-CM | POA: Diagnosis not present

## 2017-11-24 DIAGNOSIS — M999 Biomechanical lesion, unspecified: Secondary | ICD-10-CM

## 2017-11-24 NOTE — Assessment & Plan Note (Signed)
Decision today to treat with OMT was based on Physical Exam  After verbal consent patient was treated with HVLA, ME, FPR techniques in cervical, thoracic, rib, lumbar and sacral areas  Patient tolerated the procedure well with improvement in symptoms  Patient given exercises, stretches and lifestyle modifications  See medications in patient instructions if given  Patient will follow up in 12 weeks 

## 2017-11-24 NOTE — Assessment & Plan Note (Signed)
Degenerative left knee.  Discussed with patient in great length.  Discussed icing regimen and home exercise.  Follow-up again 4 weeks

## 2017-11-24 NOTE — Assessment & Plan Note (Signed)
Stable  Discussed with patient in great length.  Home exercises.  Patient follow-up in 2-3 months

## 2017-11-24 NOTE — Progress Notes (Signed)
[name] [address] [place], [place] [address] Phone: [phone] Subjective:      CC: Back and neck pain follow-up  BJY:NWGNFAOZHY  Lisa [name] is a 77 y.o. female coming in with complaint of back and neck pain.  Patient has been seen previously.  Does have known degenerative scoliosis.  Discussed icing regimen and home exercises.  Discussed which activities to do which wants to avoid.  Patient has been staying active.  Doing different classes 3 times a week.  Patient denies any numbness or tingling.  Patient states that the pain is there but not severe enough to stop her from activities.    Past Medical History:  Diagnosis Date  . Allergy   . Colon polyps    hyperplastic  . Hypercholesteremia   . Hypertension   . Insomnia   . Leukopenia   . Multinodular thyroid   . OA (osteoarthritis)    hips, knees and LS  . Osteopenia   . Psoriasis   . Shingles 08/2016   left forehead  . Vitamin D deficiency    Past Surgical History:  Procedure Laterality Date  . cardiolyte  02-2002  . COLONOSCOPY  12-2006  . FLEXIBLE SIGMOIDOSCOPY  05-2000  . HAND TENDON SURGERY  1979   L 4th finger  . KNEE SURGERY  1969   Left, cartilage  . TONSILLECTOMY AND ADENOIDECTOMY     Social History   Socioeconomic History  . Marital status: Married    Spouse name: Not on file  . Number of children: 2  . Years of education: Not on file  . Highest education level: Not on file  Occupational History  . 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 Financial resource strain: Not on file  . Food insecurity:    Worry: Not on file    Inability: Not on file  . Transportation needs:    Medical: Not on file    Non-medical: Not on file  Tobacco Use  . Smoking status: Never Smoker  . Smokeless tobacco: Never Used  Substance and Sexual Activity  . Alcohol use: Yes    Alcohol/week: 0.0 oz    Comment: socially, 2  drinks twice weekly  . Drug use: No  . Sexual activity: Not Currently    Partners: Male  Lifestyle  . Physical activity:    Days per week: Not on file    Minutes per session: Not on file  . Stress: Not on file  Relationships  . Social connections:    Talks on phone: Not on file    Gets together: Not on file    Attends religious service: Not on file    Active member of club or organization: Not on file    Attends meetings of clubs or organizations: Not on file    Relationship status: Not on file  Other Topics Concern  . Not on file  Social History Narrative   Lives alone. Husband moved into [facility] memory unit in 01/2017.  Son is in [place] (with 2 grandchildren there), daughter and 1 grandson and great-granddaughter are local.     Allergies  Allergen Reactions  . Amoxicillin Rash  . Erythromycin Rash  . Sulfa Antibiotics Rash   Family History  Problem Relation Age of Onset  . Allergies Mother   . Hypertension Mother   . Vision loss Mother   . Heart disease Mother   . Arthritis  Mother   . Arthritis Father   . Heart disease Father [id number]  . Hearing loss Sister   . Hypertension Sister   . Hyperlipidemia Sister   . Arthritis Sister   . Heart disease Sister [id number]       massive MI  . Arthritis Sister   . Heart disease Sister   . Hyperlipidemia Sister   . Hypertension Sister   . Arthritis Sister   . Hypertension Sister   . Hyperlipidemia Sister   . Thyroid disease Sister   . Diabetes Sister [id number]       diet controlled  . Vision loss Sister        macular degeneration  . Stroke Maternal Grandmother   . Heart disease Maternal Grandfather   . Stroke Maternal Grandfather   . Heart disease Paternal Grandmother   . Stroke Paternal Grandmother   . Cancer Neg Hx      Past medical history, social, surgical and family history all reviewed in electronic medical record.  No pertanent information unless stated regarding to the chief complaint.   Review of Systems:Review of  systems updated and as accurate as of 11/24/17  No headache, visual changes, nausea, vomiting, diarrhea, constipation, dizziness, abdominal pain, skin rash, fevers, chills, night sweats, weight loss, swollen lymph nodes, body aches, joint swelling, chest pain, shortness of breath, mood changes.  Positive muscle aches  Objective  Blood pressure 122/84, pulse 83, height 5\' 3"  (1.6 m), weight 144 lb (65.3 kg), SpO2 97 %. Systems examined below as of 11/24/17   General: No apparent distress alert and oriented x3 mood and affect normal, dressed appropriately.  HEENT: Pupils equal, extraocular movements intact  Respiratory: Patient's speak in full sentences and does not appear short of breath  Cardiovascular: No lower extremity edema, non tender, no erythema  Skin: Warm dry intact with no signs of infection or rash on extremities or on axial skeleton.  Abdomen: Soft nontender  Neuro: Cranial nerves II through XII are intact, neurovascularly intact in all extremities with 2+ DTRs and 2+ pulses.  Lymph: No lymphadenopathy of posterior or anterior cervical chain or axillae bilaterally.  Gait normal with good balance and coordination.  MSK:  Non tender with full range of motion and good stability and symmetric strength and tone of shoulders, elbows, wrist, hip, and ankles bilaterally.  Degenerative arthritic changes of multiple joints Knee: Left valgus deformity noted. Large thigh to calf ratio.  Tender to palpation over medial and PF joint line.  ROM full in flexion and extension and lower leg rotation. instability with valgus force.  painful patellar compression. Patellar glide with moderate crepitus. Patellar and quadriceps tendons unremarkable. Hamstring and quadriceps strength is normal. Contralateral knee shows mild arthritic changes  Neck exam shows patient does have some loss of lordosis and does have some difficulty with certain movements.  Back exam shows some degenerative scoliosis  noted.  Patient does have some mild limited range of motion in all planes. Tightness of faber bilateral   Osteopathic findings C2 flexed rotated and side bent right C6 flexed rotated and side bent left T3 extended rotated and side bent right inhaled third rib L3 flexed rotated and side bent right Sacrum right on right         Impression and Recommendations:     This case required medical decision making of moderate complexity.      Note: This dictation was prepared with Dragon dictation along with smaller phrase technology. Any transcriptional errors that result from  this process are unintentional.

## 2017-11-24 NOTE — Patient Instructions (Signed)
Good to see you  You are doing great  Ice Is your friend Restart the tart cherry extract  Have a great time at the beach See me again in 2-3 months !

## 2018-01-30 ENCOUNTER — Telehealth: Payer: Self-pay | Admitting: Family Medicine

## 2018-01-30 NOTE — Telephone Encounter (Signed)
Pt's daughter, Jill Sidelison, called stating that Lexapro is not working. Daughter says pt is extremely irritable, very aggitated, displaying aggressive behavior, depressed, has suicidal thoughts, and scattered. Jill Sidelison said pt has elbowed her several times and even broken a couple of her ribs, says insulting things including name calling and cursing her out, jumps down people throat for anything even if she is ask how she is doing or how her class went, has asked Jill Sidelison to kill her or says she wish would have died in an airplane ride or something as an example.   Jill Sidelison says a few of the family members had better success with Celexa so she wanted to mention this to Dr. Lynelle DoctorKnapp

## 2018-01-30 NOTE — Telephone Encounter (Signed)
Left message for patient to return my call.

## 2018-01-30 NOTE — Telephone Encounter (Signed)
This is very out of character, and different than I last saw her in early April.  If this behavior is going on, she needs to schedule a visit either with us, or with psych.  Not sure if she has been getting counseling, but it is recommended that she do so.  I don't think celexa is very different from lexapro, so that isn't the answer (they are chemically very similar).  This behavior is very concerning, and needs to be address (through counseling, psych, an/or here.

## 2018-02-01 NOTE — Telephone Encounter (Signed)
This was the one we spoke about.

## 2018-02-08 ENCOUNTER — Telehealth: Payer: Self-pay | Admitting: Family Medicine

## 2018-02-08 ENCOUNTER — Institutional Professional Consult (permissible substitution): Payer: Medicare Other | Admitting: Family Medicine

## 2018-02-08 NOTE — Telephone Encounter (Signed)
Called Lisa [name], she states things are going well, she is in a much better place now.  Husband moved to another facility and they are taking better care of him.  Cost is less, but drive is further.  She is not taking much xanax at all.  So I cancelled her appt for today.

## 2018-02-14 NOTE — Progress Notes (Signed)
[name] [address] [place], [place] [address] Phone: [phone] Subjective:     CC: Back pain neck pain follow-up  BJY:NWGNFAOZHY  Lisa [name] is a 77 y.o. female coming in with complaint of back pain. She is doing well. She had a massage earlier today. She continues to be active and her back bothers her occasionally.  Patient states that she is no longer having as much stress and seems to be improved.  Does respond to improve with massage and manipulation.  No radiation to any of the extremities     Past Medical History:  Diagnosis Date  . Allergy   . Colon polyps    hyperplastic  . Hypercholesteremia   . Hypertension   . Insomnia   . Leukopenia   . Multinodular thyroid   . OA (osteoarthritis)    hips, knees and LS  . Osteopenia   . Psoriasis   . Shingles 08/2016   left forehead  . Vitamin D deficiency    Past Surgical History:  Procedure Laterality Date  . cardiolyte  02-2002  . COLONOSCOPY  12-2006  . FLEXIBLE SIGMOIDOSCOPY  05-2000  . HAND TENDON SURGERY  1979   L 4th finger  . KNEE SURGERY  1969   Left, cartilage  . TONSILLECTOMY AND ADENOIDECTOMY     Social History   Socioeconomic History  . Marital status: Married    Spouse name: Not on file  . Number of children: 2  . Years of education: Not on file  . Highest education level: Not on file  Occupational History  . 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 Financial resource strain: Not on file  . Food insecurity:    Worry: Not on file    Inability: Not on file  . Transportation needs:    Medical: Not on file    Non-medical: Not on file  Tobacco Use  . Smoking status: Never Smoker  . Smokeless tobacco: Never Used  Substance and Sexual Activity  . Alcohol use: Yes    Alcohol/week: 0.0 oz    Comment: socially, 2 drinks twice weekly  . Drug use: No  . Sexual activity: Not Currently    Partners: Male    Lifestyle  . Physical activity:    Days per week: Not on file    Minutes per session: Not on file  . Stress: Not on file  Relationships  . Social connections:    Talks on phone: Not on file    Gets together: Not on file    Attends religious service: Not on file    Active member of club or organization: Not on file    Attends meetings of clubs or organizations: Not on file    Relationship status: Not on file  Other Topics Concern  . Not on file  Social History Narrative   Lives alone. Husband moved into [facility] memory unit in 01/2017.  Son is in [place] (with 2 grandchildren there), daughter and 1 grandson and great-granddaughter are local.     Allergies  Allergen Reactions  . Amoxicillin Rash  . Erythromycin Rash  . Sulfa Antibiotics Rash   Family History  Problem Relation Age of Onset  . Allergies Mother   . Hypertension Mother   . Vision loss Mother   . Heart disease Mother   . Arthritis Mother   . Arthritis Father   . Heart disease Father 48  .  Hearing loss Sister   . Hypertension Sister   . Hyperlipidemia Sister   . Arthritis Sister   . Heart disease Sister [id number]       massive MI  . Arthritis Sister   . Heart disease Sister   . Hyperlipidemia Sister   . Hypertension Sister   . Arthritis Sister   . Hypertension Sister   . Hyperlipidemia Sister   . Thyroid disease Sister   . Diabetes Sister 472       diet controlled  . Vision loss Sister        macular degeneration  . Stroke Maternal Grandmother   . Heart disease Maternal Grandfather   . Stroke Maternal Grandfather   . Heart disease Paternal Grandmother   . Stroke Paternal Grandmother   . Cancer Neg Hx      Past medical history, social, surgical and family history all reviewed in electronic medical record.  No pertanent information unless stated regarding to the chief complaint.   Review of Systems:Review of systems updated and as accurate as of 02/14/18  No headache, visual changes, nausea,  vomiting, diarrhea, constipation, dizziness, abdominal pain, skin rash, fevers, chills, night sweats, weight loss, swollen lymph nodes, body aches, joint swelling, , chest pain, shortness of breath, mood changes.  Positive muscle aches  Objective  There were no vitals taken for this visit. Systems examined below as of 02/14/18   General: No apparent distress alert and oriented x3 mood and affect normal, dressed appropriately.  HEENT: Pupils equal, extraocular movements intact  Respiratory: Patient's speak in full sentences and does not appear short of breath  Cardiovascular: No lower extremity edema, non tender, no erythema  Skin: Warm dry intact with no signs of infection or rash on extremities or on axial skeleton.  Abdomen: Soft nontender  Neuro: Cranial nerves II through XII are intact, neurovascularly intact in all extremities with 2+ DTRs and 2+ pulses.  Lymph: No lymphadenopathy of posterior or anterior cervical chain or axillae bilaterally.  Gait normal with good balance and coordination.  MSK: Mild tender with full range of motion and good stability and symmetric strength and tone of shoulders, elbows, wrist, hip, knee and ankles bilaterally.  Arthritic changes of multiple joints  Patient back does have some degenerative scoliosis.  Significant tightness mostly in the thoracolumbar junction.  Mild positive Pearlean BrownieFaber on the right side.  Tightness of the neck noted in the cervical thoracic area as well with mild crepitus with range of motion.  Negative Spurling's.  Neurovascularly intact in all extremities  Osteopathic findings C7 flexed rotated and side bent right  T3 extended rotated and side bent right inhaled third rib T11 extended rotated and side bent left L2 flexed rotated and side bent right Sacrum right on right     Impression and Recommendations:     This case required medical decision making of moderate complexity.      Note: This dictation was prepared with Dragon  dictation along with smaller phrase technology. Any transcriptional errors that result from this process are unintentional.

## 2018-02-16 ENCOUNTER — Encounter: Payer: Self-pay | Admitting: Family Medicine

## 2018-02-16 ENCOUNTER — Ambulatory Visit: Payer: Medicare Other | Admitting: Family Medicine

## 2018-02-16 VITALS — BP 144/82 | HR 73 | Ht 63.0 in | Wt 147.0 lb

## 2018-02-16 DIAGNOSIS — M542 Cervicalgia: Secondary | ICD-10-CM

## 2018-02-16 DIAGNOSIS — M9902 Segmental and somatic dysfunction of thoracic region: Secondary | ICD-10-CM | POA: Diagnosis not present

## 2018-02-16 DIAGNOSIS — M9901 Segmental and somatic dysfunction of cervical region: Secondary | ICD-10-CM | POA: Diagnosis not present

## 2018-02-16 DIAGNOSIS — M9903 Segmental and somatic dysfunction of lumbar region: Secondary | ICD-10-CM

## 2018-02-16 DIAGNOSIS — M999 Biomechanical lesion, unspecified: Secondary | ICD-10-CM | POA: Diagnosis not present

## 2018-02-16 DIAGNOSIS — M9904 Segmental and somatic dysfunction of sacral region: Secondary | ICD-10-CM

## 2018-02-16 DIAGNOSIS — M9908 Segmental and somatic dysfunction of rib cage: Secondary | ICD-10-CM

## 2018-02-16 NOTE — Patient Instructions (Signed)
Good to see you  You are doing great overall  Stay active If on amazon the brands to look at are [employer], [employer] or NOW.  Keep up everything else See me again in 3 months !

## 2018-02-16 NOTE — Assessment & Plan Note (Signed)
Stable overall.  Does have significant arthritic changes of multiple joints.  Has responded well to manipulation.  We discussed icing regimen.  Discussed which activities doing which wants to avoid.  Patient is to increase activity slowly over the course the next several days.  Follow-up with me again in 4 to 8 weeks

## 2018-02-16 NOTE — Assessment & Plan Note (Signed)
Decision today to treat with OMT was based on Physical Exam  After verbal consent patient was treated with HVLA, ME, FPR techniques in cervical, thoracic, rib, lumbar and sacral areas  Patient tolerated the procedure well with improvement in symptoms  Patient given exercises, stretches and lifestyle modifications  See medications in patient instructions if given  Patient will follow up in 4-12 weeks 

## 2018-02-25 ENCOUNTER — Other Ambulatory Visit: Payer: Self-pay | Admitting: Family Medicine

## 2018-02-25 DIAGNOSIS — G4700 Insomnia, unspecified: Secondary | ICD-10-CM

## 2018-02-25 DIAGNOSIS — F411 Generalized anxiety disorder: Secondary | ICD-10-CM

## 2018-02-25 DIAGNOSIS — I10 Essential (primary) hypertension: Secondary | ICD-10-CM

## 2018-02-25 DIAGNOSIS — E7800 Pure hypercholesterolemia, unspecified: Secondary | ICD-10-CM

## 2018-03-13 DIAGNOSIS — H52203 Unspecified astigmatism, bilateral: Secondary | ICD-10-CM | POA: Diagnosis not present

## 2018-03-13 DIAGNOSIS — H25013 Cortical age-related cataract, bilateral: Secondary | ICD-10-CM | POA: Diagnosis not present

## 2018-03-13 DIAGNOSIS — H2513 Age-related nuclear cataract, bilateral: Secondary | ICD-10-CM | POA: Diagnosis not present

## 2018-03-28 DIAGNOSIS — H2511 Age-related nuclear cataract, right eye: Secondary | ICD-10-CM | POA: Diagnosis not present

## 2018-03-28 DIAGNOSIS — H25811 Combined forms of age-related cataract, right eye: Secondary | ICD-10-CM | POA: Diagnosis not present

## 2018-03-28 DIAGNOSIS — H25011 Cortical age-related cataract, right eye: Secondary | ICD-10-CM | POA: Diagnosis not present

## 2018-05-06 ENCOUNTER — Other Ambulatory Visit: Payer: Self-pay | Admitting: Family Medicine

## 2018-05-06 DIAGNOSIS — E039 Hypothyroidism, unspecified: Secondary | ICD-10-CM

## 2018-05-08 NOTE — Telephone Encounter (Signed)
Pt has an appt on 10/10 

## 2018-05-09 DIAGNOSIS — H2512 Age-related nuclear cataract, left eye: Secondary | ICD-10-CM | POA: Diagnosis not present

## 2018-05-09 DIAGNOSIS — H25812 Combined forms of age-related cataract, left eye: Secondary | ICD-10-CM | POA: Diagnosis not present

## 2018-05-09 DIAGNOSIS — H25012 Cortical age-related cataract, left eye: Secondary | ICD-10-CM | POA: Diagnosis not present

## 2018-05-11 ENCOUNTER — Other Ambulatory Visit: Payer: Self-pay | Admitting: Family Medicine

## 2018-05-11 DIAGNOSIS — G4700 Insomnia, unspecified: Secondary | ICD-10-CM

## 2018-05-11 NOTE — Telephone Encounter (Signed)
Spoke with patient and she only has 6 left, her appt is on the the 10th and she is concerned since this mail order that she will not have enough to make it.

## 2018-05-11 NOTE — Telephone Encounter (Signed)
Left message for patient to return my call.

## 2018-05-11 NOTE — Telephone Encounter (Signed)
I believe she has appt next week (refill needed by 10/22). Check to see if she will have enough time to get her rx if we do it at her visit (will do with more refills at visit, vs if I refill now)

## 2018-05-11 NOTE — Telephone Encounter (Signed)
Is this okay to refill? 

## 2018-05-16 ENCOUNTER — Other Ambulatory Visit: Payer: Medicare Other

## 2018-05-16 DIAGNOSIS — E7800 Pure hypercholesterolemia, unspecified: Secondary | ICD-10-CM | POA: Diagnosis not present

## 2018-05-16 DIAGNOSIS — I10 Essential (primary) hypertension: Secondary | ICD-10-CM

## 2018-05-16 DIAGNOSIS — Z5181 Encounter for therapeutic drug level monitoring: Secondary | ICD-10-CM | POA: Diagnosis not present

## 2018-05-16 DIAGNOSIS — Z0000 Encounter for general adult medical examination without abnormal findings: Secondary | ICD-10-CM | POA: Diagnosis not present

## 2018-05-16 DIAGNOSIS — E039 Hypothyroidism, unspecified: Secondary | ICD-10-CM

## 2018-05-16 LAB — COMPREHENSIVE METABOLIC PANEL
A/G RATIO: 2 (ref 1.2–2.2)
ALT: 20 IU/L (ref 0–32)
AST: 18 IU/L (ref 0–40)
Albumin: 4.4 g/dL (ref 3.5–4.8)
Alkaline Phosphatase: 67 IU/L (ref 39–117)
BILIRUBIN TOTAL: 0.6 mg/dL (ref 0.0–1.2)
BUN/Creatinine Ratio: 14 (ref 12–28)
BUN: 9 mg/dL (ref 8–27)
CHLORIDE: 100 mmol/L (ref 96–106)
CO2: 22 mmol/L (ref 20–29)
Calcium: 9.9 mg/dL (ref 8.7–10.3)
Creatinine, Ser: 0.63 mg/dL (ref 0.57–1.00)
GFR calc non Af Amer: 87 mL/min/{1.73_m2} (ref >59)
GFR, EST AFRICAN AMERICAN: 100 mL/min/{1.73_m2} (ref >59)
GLOBULIN, TOTAL: 2.2 g/dL (ref 1.5–4.5)
Glucose: 90 mg/dL (ref 65–99)
POTASSIUM: 4.3 mmol/L (ref 3.5–5.2)
SODIUM: 137 mmol/L (ref 134–144)
TOTAL PROTEIN: 6.6 g/dL (ref 6.0–8.5)

## 2018-05-16 NOTE — Addendum Note (Signed)
Addended by: Renelda Loma on: 05/16/2018 09:05 AM   Modules accepted: Orders

## 2018-05-17 LAB — CBC WITH DIFFERENTIAL/PLATELET
BASOS: 1 %
Basophils Absolute: 0 10*3/uL (ref 0.0–0.2)
EOS (ABSOLUTE): 0.1 10*3/uL (ref 0.0–0.4)
EOS: 4 %
HEMATOCRIT: 36.8 % (ref 34.0–46.6)
Hemoglobin: 12.7 g/dL (ref 11.1–15.9)
IMMATURE GRANULOCYTES: 0 %
Immature Grans (Abs): 0 10*3/uL (ref 0.0–0.1)
LYMPHS: 31 %
Lymphocytes Absolute: 1 10*3/uL (ref 0.7–3.1)
MCH: 31 pg (ref 26.6–33.0)
MCHC: 34.5 g/dL (ref 31.5–35.7)
MCV: 90 fL (ref 79–97)
Monocytes Absolute: 0.4 10*3/uL (ref 0.1–0.9)
Monocytes: 11 %
NEUTROS ABS: 1.7 10*3/uL (ref 1.4–7.0)
Neutrophils: 53 %
Platelets: 332 10*3/uL (ref 150–450)
RBC: 4.1 x10E6/uL (ref 3.77–5.28)
RDW: 13.1 % (ref 12.3–15.4)
WBC: 3.2 10*3/uL — ABNORMAL LOW (ref 3.4–10.8)

## 2018-05-17 LAB — LIPID PANEL
CHOL/HDL RATIO: 2.5 ratio (ref 0.0–4.4)
Cholesterol, Total: 153 mg/dL (ref 100–199)
HDL: 62 mg/dL (ref >39)
LDL CALC: 76 mg/dL (ref 0–99)
Triglycerides: 74 mg/dL (ref 0–149)
VLDL CHOLESTEROL CAL: 15 mg/dL (ref 5–40)

## 2018-05-17 LAB — TSH: TSH: 2.61 u[IU]/mL (ref 0.450–4.500)

## 2018-05-17 NOTE — Patient Instructions (Addendum)
  HEALTH MAINTENANCE RECOMMENDATIONS:  It is recommended that you get at least 30 minutes of aerobic exercise at least 5 days/week (for weight loss, you may need as much as 60-90 minutes). This can be any activity that gets your heart rate up. This can be divided in 10-15 minute intervals if needed, but try and build up your endurance at least once a week.  Weight bearing exercise is also recommended twice weekly.  Eat a healthy diet with lots of vegetables, fruits and fiber.  "Colorful" foods have a lot of vitamins (ie green vegetables, tomatoes, red peppers, etc).  Limit sweet tea, regular sodas and alcoholic beverages, all of which has a lot of calories and sugar.  Up to 1 alcoholic drink daily may be beneficial for women (unless trying to lose weight, watch sugars).  Drink a lot of water.  Calcium recommendations are 1200-1500 mg daily (1500 mg for postmenopausal women or women without ovaries), and vitamin D 1000 IU daily.  This should be obtained from diet and/or supplements (vitamins), and calcium should not be taken all at once, but in divided doses.  Monthly self breast exams and yearly mammograms for women over the age of 31 is recommended.  Sunscreen of at least SPF 30 should be used on all sun-exposed parts of the skin when outside between the hours of 10 am and 4 pm (not just when at beach or pool, but even with exercise, golf, tennis, and yard work!)  Use a sunscreen that says "broad spectrum" so it covers both UVA and UVB rays, and make sure to reapply every 1-2 hours.  Remember to change the batteries in your smoke detectors when changing your clock times in the spring and fall.  Use your seat belt every time you are in a car, and please drive safely and not be distracted with cell phones and texting while driving.   Ms. Randa , Thank you for taking time to come for your Medicare Wellness Visit. I appreciate your ongoing commitment to your health goals. Please review the  following plan we discussed and let me know if I can assist you in the future.   This is a list of the screening recommended for you and due dates:  Health Maintenance  Topic Date Due  . Flu Shot  03/09/2018  . Tetanus Vaccine  01/12/2022  . DEXA scan (bone density measurement)  Completed  . Pneumonia vaccines  Completed   Flu shot was given today.  Yearly mammograms are recommended--your last one was 03/2017; If you prefer every 2 years, that is okay (that is USPTF guidelines recommendation, most other groups recommend it yearly).  A pneumovax booster is recommended/suggested (not required), consider this.  I recommend getting the new shingles vaccine (Shingrix). You will need to check with your insurance to see if it is covered, and if covered by Medicare Part D, you need to get from the pharmacy rather than our office.  It is a series of 2 injections, spaced 2 months apart.  Please mail me your blood pressure log in the next few weeks. Be sure to follow low sodium diet. Periodically check it when you return from your exercise class (and mark that under comments). Goal is <135/85.  If consistently >140 we may need to add a second medication.

## 2018-05-17 NOTE — Progress Notes (Signed)
Chief Complaint  Patient presents with  . Medicare Wellness    nonfasting AWV (labs already done) with pelvic. Had recent eye exam. No new concerns.     Lisa [name] is a 77 y.o. female who presents for annual physical exam, Medicare wellness visit and follow-up on chronic medical conditions.    She had bilateral cataract surgery, end of August and 10/1.  Doing well, but needs glasses to read now. Overall notes significant improvement.  Hypertension follow-up: She is compliant with taking her Avapro 300mg  daily. She forgot to bring her BP log with her.  Recalls 120's-145 (seeing more in the 140's than previously)/75.  Deniesheadaches, dizziness, chest pain, palpitations. Denies edema; denies side effects.   Hyperlipidemia follow-up: Patient is reportedly following a low-fat, low cholesterol diet. Compliant with medications and denies medication side effects. She had labs prior to her visit, see below.  Hypothyroidism: Thyroid biopsy in 2011 was benign; had seen Dr. Talmage Nap in the past, but we are now managing her medications. Denies fatigue, hair, skin, bowel changes. Denies any mood changes. Denies dysphagia or any thyroid concerns. Takes Synthroid on empty stomach, separate from food and other medications.  Insomnia: Well controlled with Seroquel.  Anxiety: She is taking10mg  of lexapro without side effects, and feels that she is doing well with respect to her moods and anxiety.  Since husband switched memory care facilities, she is much happier. Can't recall the last time she needed to take alprazolam. Husband has cognitive deficits. Seeing her great granddaughter makes her very happy; just turned a year, but moving to [facility].  They are expecting a second.  She continues to seeDr. Terrilee Files for joint pains (neck, back). She goes every few months, and this is helpful. She has been getting monthly massages for herself, which she likes, and is helpful (since she turned  75).  Osteopenia: Takes Calcium with D BID, gets regular exercise. She has been getting weight-bearing exercise through [employer]. She reports previously taking Actonel for 10 years, stopped it on her own, concerned about risks (she reports heart attacks). Last DEXA 03/2017 T-1.8 at R fem neck  Seasonal allergies, sometimes triggers mild vertigo. She hasn't had vertigo in a very long time (and it was very mild).  Immunization History  Administered Date(s) Administered  . Influenza Whole 05/02/2010  . Influenza, High Dose Seasonal PF 05/16/2014, 04/23/2015, 05/05/2016, 05/12/2017  . Pneumococcal Conjugate-13 04/17/2014  . Pneumococcal Polysaccharide-23 09/21/2005  . Td 01/02/2002  . Tdap 01/13/2012  . Zoster 02/26/2011   Has appt at [facility] later this month to get Shingrix Last Pap smear: 04/2015, normal no high risk HPV Last mammo 03/2017 Colonoscopy 5/08 (refuses to do again); negative Cologuard 05/2017 Dexa 03/2017, T-1.8 at R fem neck. (2016 study showed -1.7 L fem neck). Abnormal FRAX at hip, 3.3% (was 3.8% in 2016) Dentist and ophtho regularly  Exercise: She goes to [employer] 3x/week and chair yoga once weekly. Vitamin D last checked 04/2015 normal at 40  Other doctors caring for patient include: Dentist: Dr. Lendon Ka Ophtho: Dr. Ranae Pila at [facility] op[facility]ho Cardiologist: Dr. Tresa Endo (no longer sees) Endocrinologist: Dr. Talmage Nap (no longer sees) Sports medicine: Dr. Terrilee Files  Depression screen: negative Fall screen: none Functional status survey notable for decrease in hearing.  Had hearing eval 1-2 years ago, hearing aids recommended.  Was too expensive at the time, but now has info about ones through her insurance and plans to contact them. Some vision change--had bilateral cataract surgery recently.  Slight leakage of urine, wears pantiliner Mini-Cog screen:  Score of 4 (missed one on recall). Given 3 different words, and 20 minutes later she was  able to recall all 6 words.   End of Life Discussion: Patient hasa living will and medical power of attorney.   Past Medical History:  Diagnosis Date  . Allergy   . Colon polyps    hyperplastic  . Hypercholesteremia   . Hypertension   . Insomnia   . Leukopenia   . Multinodular thyroid   . OA (osteoarthritis)    hips, knees and LS  . Osteopenia   . Psoriasis   . Shingles 08/2016   left forehead  . Vitamin D deficiency     Past Surgical History:  Procedure Laterality Date  . cardiolyte  02-2002  . CATARACT EXTRACTION, BILATERAL Bilateral 04/05/18 and 05/09/18   Dr. Ranae Pila ([place] ophtho)  . COLONOSCOPY  12-2006  . FLEXIBLE SIGMOIDOSCOPY  05-2000  . HAND TENDON SURGERY  1979   L 4th finger  . KNEE SURGERY  1969   Left, cartilage  . TONSILLECTOMY AND ADENOIDECTOMY      Social History   Socioeconomic History  . Marital status: Married    Spouse name: Not on file  . Number of children: 2  . Years of education: Not on file  . Highest education level: Not on file  Occupational History  . 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 Financial resource strain: Not on file  . Food insecurity:    Worry: Not on file    Inability: Not on file  . Transportation needs:    Medical: Not on file    Non-medical: Not on file  Tobacco Use  . Smoking status: Never Smoker  . Smokeless tobacco: Never Used  Substance and Sexual Activity  . Alcohol use: Yes    Alcohol/week: 0.0 standard drinks    Comment: socially, 2 drinks twice weekly  . Drug use: No  . Sexual activity: Not Currently    Partners: Male  Lifestyle  . Physical activity:    Days per week: Not on file    Minutes per session: Not on file  . Stress: Not on file  Relationships  . Social connections:    Talks on phone: Not on file    Gets together: Not on file    Attends religious service: Not on file    Active member of club or organization: Not on file    Attends  meetings of clubs or organizations: Not on file    Relationship status: Not on file  . Intimate partner violence:    Fear of current or ex partner: Not on file    Emotionally abused: Not on file    Physically abused: Not on file    Forced sexual activity: Not on file  Other Topics Concern  . Not on file  Social History Narrative   Lives alone. Husband moved to [facility] in [place] (memory care), after having been at [facility] (just under a year).  Son is in [place] (with 2 grandchildren there), daughter and 1 grandson and great-granddaughter were local, moved to [facility] 2019.    Family History  Problem Relation Age of Onset  . Allergies Mother   . Hypertension Mother   . Vision loss Mother   . Heart disease Mother   . Arthritis Mother   . Arthritis Father   . Heart disease Father 27  .  Hearing loss Sister   . Hypertension Sister   . Hyperlipidemia Sister   . Arthritis Sister   . Heart disease Sister 34       massive MI  . Arthritis Sister   . Heart disease Sister   . Hyperlipidemia Sister   . Hypertension Sister   . Arthritis Sister   . Hypertension Sister   . Hyperlipidemia Sister   . Thyroid disease Sister   . Diabetes Sister 47       diet controlled  . Vision loss Sister        macular degeneration  . Stroke Maternal Grandmother   . Heart disease Maternal Grandfather   . Stroke Maternal Grandfather   . Heart disease Paternal Grandmother   . Stroke Paternal Grandmother   . Cancer Neg Hx     Outpatient Encounter Medications as of 05/18/2018  Medication Sig Note  . aspirin 81 MG tablet Take 81 mg by mouth daily.     Marland Kitchen atorvastatin (LIPITOR) 20 MG tablet Take 1 tablet (20 mg total) by mouth daily.   . Calcium Carbonate-Vitamin D (CALTRATE 600+D PO) Take 1 tablet by mouth 2 (two) times daily.     . cholecalciferol (VITAMIN D) 1000 units tablet Take 1,000 Units by mouth daily.   Marland Kitchen escitalopram (LEXAPRO) 10 MG tablet Take 1 tablet (10 mg total) by mouth  daily.   . fluocinonide (LIDEX) 0.05 % gel Apply 1 application topically 4 (four) times daily. Reported on 12/12/2015 05/12/2017: Uses prn for canker sores; much better since changer her drinking water  . irbesartan (AVAPRO) 300 MG tablet Take 1 tablet (300 mg total) by mouth daily.   . Multiple Vitamins-Minerals (CENTRUM SILVER PO) Take 1 tablet by mouth daily.     . QUEtiapine (SEROQUEL) 25 MG tablet TAKE 1 TABLET BY MOUTH AT  BEDTIME   . SYNTHROID 50 MCG tablet Take 1 tablet (50 mcg total) by mouth daily before breakfast.   . [DISCONTINUED] atorvastatin (LIPITOR) 20 MG tablet TAKE 1 TABLET BY MOUTH  DAILY   . [DISCONTINUED] escitalopram (LEXAPRO) 10 MG tablet TAKE 1 TABLET BY MOUTH  DAILY   . [DISCONTINUED] irbesartan (AVAPRO) 300 MG tablet TAKE 1 TABLET BY MOUTH  DAILY   . [DISCONTINUED] SYNTHROID 50 MCG tablet TAKE 1 TABLET BY MOUTH  DAILY BEFORE BREAKFAST   . ALPRAZolam (XANAX) 0.25 MG tablet TAKE 1 TABLET BY MOUTH 3 TIMES A DAY AS NEEDED FOR ANXIETY (Patient not taking: Reported on 05/18/2018)    No facility-administered encounter medications on file as of 05/18/2018.     Allergies  Allergen Reactions  . Amoxicillin Rash  . Erythromycin Rash  . Sulfa Antibiotics Rash    ROS: The patient denies anorexia, fever, weight changes, headaches, vision changes, ear pain, sore throat, breast concerns, chest pain, palpitations, dizziness, syncope, dyspnea on exertion, cough, swelling, nausea, vomiting, diarrhea, constipation, abdominal pain, melena, hematochezia, indigestion/heartburn, hematuria, dysuria, vaginal bleeding, discharge, odor or itch, genital lesions, numbness, tingling, weakness, tremor, suspicious skin lesions, depression, anxiety, abnormal bleeding/bruising, or enlarged lymph nodes. Neck and back pain improved Slight hearing loss and tinnitus, stable (more aware of it in right ear). Slight leakage of urine, unchanged. Moods overall good   PHYSICAL EXAM:  BP (!) 144/100    Pulse 72   Ht 5' 1.5" (1.562 m)   Wt 144 lb (65.3 kg)   BMI 26.77 kg/m   148/80 on repeat by MD  Wt Readings from Last 3 Encounters:  05/18/18 144 lb (  65.3 kg)  02/16/18 147 lb (66.7 kg)  11/24/17 144 lb (65.3 kg)    General Appearance:  Alert, cooperative, no distress, appears stated age. Very talkative.   Head:  Normocephalic, without obvious abnormality, atraumatic   Eyes:  PERRL, conjunctiva/corneas clear, EOM's intact, fundi benign   Ears:  Normal TM's and external ear canals   Nose:  Nares normal, mucosa normal, no drainage or sinus tenderness   Throat:  Lips and tongue normal; teeth normal, mucosa  Neck:  Supple, no lymphadenopathy; thyroid: no enlargement/tenderness/nodules; no carotid bruit or JVD. c-spine nontender. There is a 3.5 x 2.5 cm soft tissue mass posterior neck, right sided (but crosses midline)-- Pt states unchanged x years  Back:  Spine nontender, no curvature, ROM normal, no CVA tenderness   Lungs:  Clear to auscultation bilaterally without wheezes, rales or ronchi; respirations unlabored   Chest Wall:  No tenderness or deformity   Heart:  Regular rate and rhythm, S1 and S2 normal, no murmur, rub or gallop   Breast Exam:  No tenderness, masses, or nipple discharge or inversion. No axillary lymphadenopathy   Abdomen:  Soft, non-tender, nondistended, normoactive bowel sounds, no masses, no hepatosplenomegaly   Genitalia:  Normal external genitalia without lesions. Mild atrophic changes. BUS and vagina normal; no cervical motion tenderness. No abnormal vaginal discharge. Uterus and adnexa not enlarged, nontender, no masses. Pap not performed   Rectal:  Normal tone, no masses or tenderness; guaiac negative stool   Extremities:  No clubbing, cyanosis or edema.   Pulses:  2+ and symmetric all extremities   Skin:  Skin color, texture, turgor normal, no rashes or lesions   Lymph nodes:  Cervical, supraclavicular, and axillary nodes  normal   Neurologic:  CNII-XII intact, normal strength, sensation and gait; reflexes 2-3+ and symmetric throughout   Psych:  Normal mood, affect, hygiene and grooming     Chemistry      Component Value Date/Time   NA 137 05/16/2018 0912   K 4.3 05/16/2018 0912   CL 100 05/16/2018 0912   CO2 22 05/16/2018 0912   BUN 9 05/16/2018 0912   CREATININE 0.63 05/16/2018 0912   CREATININE 0.66 05/05/2017 0745      Component Value Date/Time   CALCIUM 9.9 05/16/2018 0912   ALKPHOS 67 05/16/2018 0912   AST 18 05/16/2018 0912   ALT 20 05/16/2018 0912   BILITOT 0.6 05/16/2018 0912     Fasting glu 90 Lab Results  Component Value Date   TSH 2.610 05/16/2018   Lab Results  Component Value Date   CHOL 153 05/16/2018   HDL 62 05/16/2018   LDLCALC 76 05/16/2018   TRIG 74 05/16/2018   CHOLHDL 2.5 05/16/2018   Lab Results  Component Value Date   WBC 3.2 (L) 05/16/2018   HGB 12.7 05/16/2018   HCT 36.8 05/16/2018   MCV 90 05/16/2018   PLT 332 05/16/2018     ASSESSMENT/PLAN:  Annual physical exam - Plan: POCT Urinalysis DIP (Proadvantage Device)  Medicare annual wellness visit, subsequent  Essential hypertension, benign - BP elevated; to monitor regularly at home, and send in log in a few weeks for review. Cont current meds, low Na diet, regular exercise - Plan: irbesartan (AVAPRO) 300 MG tablet  Generalized anxiety disorder - controlled, continue lexapro. Briefly discussed potential to taper, but her flares are reactionary, due to stressors, so elect to keep it same - Plan: escitalopram (LEXAPRO) 10 MG tablet  Hypothyroidism, unspecified type - adequately replaced, continue  current dose - Plan: SYNTHROID 50 MCG tablet  Insomnia, unspecified type  Osteopenia, unspecified location - cont Ca, D, weight-bearing exercise. DEXA due 03/2019  Need for influenza vaccination - Plan: Flu vaccine HIGH DOSE PF (Fluzone High dose)  Pure hypercholesterolemia - lipids at goal -  Plan: atorvastatin (LIPITOR) 20 MG tablet  Generalized anxiety disorder - Plan: escitalopram (LEXAPRO) 10 MG tablet  Hypothyroidism, unspecified type - Plan: SYNTHROID 50 MCG tablet  Essential hypertension, benign - Plan: irbesartan (AVAPRO) 300 MG tablet  Neck mass - soft tissue, posterior--suspect lipoma. Stable per pt  Consider pneumovax booster--she will consider.  Declined today (technically, she is low risk, not needed per guidelines). Shingrix rec (her shingles was 08/2016)--has appt scheduled to get at pharmacy.  Discussed monthly self breast exams and yearly mammograms (pt prefers to do q2 years); at least 30 minutes of aerobic activity at least 5 days/week and weight-bearing exercise 2x/week; proper sunscreen use reviewed; healthy diet, including goals of calcium and vitamin D intake and alcohol recommendations (less than or equal to 1 drink/day) reviewed; regular seatbelt use; changing batteries in smoke detectors. Immunization recommendations discussed--high dose flu shot today. Pneumovax offered/declined. Colonoscopy recommendations reviewed--she refuses to have another colonoscopy. Negative Cologuard 05/2017, due gain 2021.  Pt is full code, full care. MOST form reviewed and updated  Pt to mail in BP log in a few weeks, to return sooner than 6 months if running high. O/w 6 month med check (HTN), and 1 year CPE/AWV.   Medicare Attestation I have personally reviewed: The patient's medical and social history Their use of alcohol, tobacco or illicit drugs Their current medications and supplements The patient's functional ability including ADLs,fall risks, home safety risks, cognitive, and hearing and visual impairment Diet and physical activities Evidence for depression or mood disorders  The patient's weight, height and BMI have been recorded in the chart.  I have made referrals, counseling, and provided education to the patient based on review of the above and I have  provided the patient with a written personalized care plan for preventive services.

## 2018-05-18 ENCOUNTER — Ambulatory Visit: Payer: Medicare Other | Admitting: Family Medicine

## 2018-05-18 ENCOUNTER — Encounter: Payer: Self-pay | Admitting: Family Medicine

## 2018-05-18 ENCOUNTER — Telehealth: Payer: Self-pay | Admitting: Family Medicine

## 2018-05-18 VITALS — BP 148/80 | HR 72 | Ht 61.5 in | Wt 144.0 lb

## 2018-05-18 DIAGNOSIS — E039 Hypothyroidism, unspecified: Secondary | ICD-10-CM

## 2018-05-18 DIAGNOSIS — G4700 Insomnia, unspecified: Secondary | ICD-10-CM | POA: Diagnosis not present

## 2018-05-18 DIAGNOSIS — I10 Essential (primary) hypertension: Secondary | ICD-10-CM

## 2018-05-18 DIAGNOSIS — Z0000 Encounter for general adult medical examination without abnormal findings: Secondary | ICD-10-CM

## 2018-05-18 DIAGNOSIS — R221 Localized swelling, mass and lump, neck: Secondary | ICD-10-CM

## 2018-05-18 DIAGNOSIS — F411 Generalized anxiety disorder: Secondary | ICD-10-CM | POA: Diagnosis not present

## 2018-05-18 DIAGNOSIS — M8580 Other specified disorders of bone density and structure, unspecified site: Secondary | ICD-10-CM

## 2018-05-18 DIAGNOSIS — Z5181 Encounter for therapeutic drug level monitoring: Secondary | ICD-10-CM

## 2018-05-18 DIAGNOSIS — E7800 Pure hypercholesterolemia, unspecified: Secondary | ICD-10-CM

## 2018-05-18 DIAGNOSIS — Z23 Encounter for immunization: Secondary | ICD-10-CM | POA: Diagnosis not present

## 2018-05-18 LAB — POCT URINALYSIS DIP (PROADVANTAGE DEVICE)
Bilirubin, UA: NEGATIVE
Glucose, UA: NEGATIVE mg/dL
Ketones, POC UA: NEGATIVE mg/dL
Leukocytes, UA: NEGATIVE
Nitrite, UA: NEGATIVE
PH UA: 6.5 (ref 5.0–8.0)
PROTEIN UA: NEGATIVE mg/dL
RBC UA: NEGATIVE
Specific Gravity, Urine: 1.01
UUROB: NEGATIVE

## 2018-05-18 MED ORDER — ESCITALOPRAM OXALATE 10 MG PO TABS: 10.0000 mg | ORAL_TABLET | Freq: Every day | ORAL | 1 refills | Status: DC

## 2018-05-18 MED ORDER — SYNTHROID 50 MCG PO TABS: 50.0000 ug | ORAL_TABLET | Freq: Every day | ORAL | 3 refills | Status: DC

## 2018-05-18 MED ORDER — ATORVASTATIN CALCIUM 20 MG PO TABS: 20.0000 mg | ORAL_TABLET | Freq: Every day | ORAL | 1 refills | Status: DC

## 2018-05-18 MED ORDER — IRBESARTAN 300 MG PO TABS: 300.0000 mg | ORAL_TABLET | Freq: Every day | ORAL | 1 refills | Status: DC

## 2018-05-18 NOTE — Telephone Encounter (Signed)
Pt scheduled for CPE/AWV on 05/24/19. She would like to come in for prior labs on 05/22/19. Is this ok?

## 2018-05-19 NOTE — Telephone Encounter (Signed)
Orders entered

## 2018-05-19 NOTE — Addendum Note (Signed)
Addended by: Joselyn Arrow on: 05/19/2018 04:44 PM   Modules accepted: Orders

## 2018-05-20 ENCOUNTER — Encounter: Payer: Self-pay | Admitting: Family Medicine

## 2018-05-23 NOTE — Progress Notes (Signed)
[name] [address] [place], [place] [address] Phone: [phone] Subjective:      CC: Back pain  [id number]  Lisa [name] is a 77 y.o. female coming in with complaint of back pain. She has not been having any issues since last visit. She is here for OMT as this helps to manage her pain.  Patient overall has been doing relatively well.  Continues to be active.  Patient does not have complaints and goes to yoga most days of the week.  Some mild tightness but nothing severe.     Past Medical History:  Diagnosis Date  . Allergy   . Colon polyps    hyperplastic  . Hypercholesteremia   . Hypertension   . Insomnia   . Leukopenia   . Multinodular thyroid   . OA (osteoarthritis)    hips, knees and LS  . Osteopenia   . Psoriasis   . Shingles 08/2016   left forehead  . Vitamin D deficiency    Past Surgical History:  Procedure Laterality Date  . cardiolyte  02-2002  . CATARACT EXTRACTION, BILATERAL Bilateral 04/05/18 and 05/09/18   Dr. Ranae Pila ([place] [facility])  . COLONOSCOPY  12-2006  . FLEXIBLE SIGMOIDOSCOPY  05-2000  . HAND TENDON SURGERY  1979   L 4th finger  . KNEE SURGERY  1969   Left, cartilage  . TONSILLECTOMY AND ADENOIDECTOMY     Social History   Socioeconomic History  . Marital status: Married    Spouse name: Not on file  . Number of children: 2  . Years of education: Not on file  . Highest education level: Not on file  Occupational History  . 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 Financial resource strain: Not on file  . Food insecurity:    Worry: Not on file    Inability: Not on file  . Transportation needs:    Medical: Not on file    Non-medical: Not on file  Tobacco Use  . Smoking status: Never Smoker  . Smokeless tobacco: Never Used  Substance and Sexual Activity  . Alcohol use: Yes    Alcohol/week: 0.0 standard drinks    Comment: socially, 2 drinks twice  weekly  . Drug use: No  . Sexual activity: Not Currently    Partners: Male  Lifestyle  . Physical activity:    Days per week: Not on file    Minutes per session: Not on file  . Stress: Not on file  Relationships  . Social connections:    Talks on phone: Not on file    Gets together: Not on file    Attends religious service: Not on file    Active member of club or organization: Not on file    Attends meetings of clubs or organizations: Not on file    Relationship status: Not on file  Other Topics Concern  . Not on file  Social History Narrative   Lives alone. Husband moved to [facility] in [place] (memory care), after having been at [facility] (just under a year).  Son is in [place] (with 2 grandchildren there), daughter and 1 grandson and great-granddaughter were local, moved to [facility] 2019.   Allergies  Allergen Reactions  . Amoxicillin Rash  . Erythromycin Rash  . Sulfa Antibiotics Rash   Family History  Problem Relation Age of Onset  . Allergies Mother   . Hypertension Mother   .  Vision loss Mother   . Heart disease Mother   . Arthritis Mother   . Arthritis Father   . Heart disease Father 95  . Hearing loss Sister   . Hypertension Sister   . Hyperlipidemia Sister   . Arthritis Sister   . Heart disease Sister 65       massive MI  . Arthritis Sister   . Heart disease Sister   . Hyperlipidemia Sister   . Hypertension Sister   . Arthritis Sister   . Hypertension Sister   . Hyperlipidemia Sister   . Thyroid disease Sister   . Diabetes Sister 14       diet controlled  . Vision loss Sister        macular degeneration  . Stroke Maternal Grandmother   . Heart disease Maternal Grandfather   . Stroke Maternal Grandfather   . Heart disease Paternal Grandmother   . Stroke Paternal Grandmother   . Cancer Neg Hx     Current Outpatient Medications (Endocrine & Metabolic):  .  SYNTHROID 50 MCG tablet, Take 1 tablet (50 mcg total) by mouth daily before  breakfast.  Current Outpatient Medications (Cardiovascular):  .  atorvastatin (LIPITOR) 20 MG tablet, Take 1 tablet (20 mg total) by mouth daily. .  irbesartan (AVAPRO) 300 MG tablet, Take 1 tablet (300 mg total) by mouth daily.   Current Outpatient Medications (Analgesics):  .  aspirin 81 MG tablet, Take 81 mg by mouth daily.     Current Outpatient Medications (Other):  Marland Kitchen  ALPRAZolam (XANAX) 0.25 MG tablet, TAKE 1 TABLET BY MOUTH 3 TIMES A DAY AS NEEDED FOR ANXIETY .  Calcium Carbonate-Vitamin D (CALTRATE 600+D PO), Take 1 tablet by mouth 2 (two) times daily.   .  cholecalciferol (VITAMIN D) 1000 units tablet, Take 1,000 Units by mouth daily. Marland Kitchen  escitalopram (LEXAPRO) 10 MG tablet, Take 1 tablet (10 mg total) by mouth daily. .  fluocinonide (LIDEX) 0.05 % gel, Apply 1 application topically 4 (four) times daily. Reported on 12/12/2015 .  Multiple Vitamins-Minerals (CENTRUM SILVER PO), Take 1 tablet by mouth daily.   .  QUEtiapine (SEROQUEL) 25 MG tablet, TAKE 1 TABLET BY MOUTH AT  BEDTIME    Past medical history, social, surgical and family history all reviewed in electronic medical record.  No pertanent information unless stated regarding to the chief complaint.   Review of Systems:  No headache, visual changes, nausea, vomiting, diarrhea, constipation, dizziness, abdominal pain, skin rash, fevers, chills, night sweats, weight loss, swollen lymph nodes, body aches, joint swelling, muscle aches, chest pain, shortness of breath, mood changes.   Objective  Blood pressure 108/72, pulse (!) 37, height 5' 1.5" (1.562 m), weight 144 lb (65.3 kg), SpO2 92 %.     General: No apparent distress alert and oriented x3 mood and affect normal, dressed appropriately.  HEENT: Pupils equal, extraocular movements intact  Respiratory: Patient's speak in full sentences and does not appear short of breath  Cardiovascular: No lower extremity edema, non tender, no erythema  Skin: Warm dry intact with no  signs of infection or rash on extremities or on axial skeleton.  Abdomen: Soft nontender  Neuro: Cranial nerves II through XII are intact, neurovascularly intact in all extremities with 2+ DTRs and 2+ pulses.  Lymph: No lymphadenopathy of posterior or anterior cervical chain or axillae bilaterally.  Gait normal with good balance and coordination.  MSK:  tender with full range of motion and good stability and symmetric strength and  tone of shoulders, elbows, wrist, hip, knee and ankles bilaterally.  Arthritic changes noted Patient is low back exam does have some degenerative scoliosis noted with loss of lordosis.  Tenderness to palpation in the paraspinal musculature diffusely.  Some tightness.  Will excellent range of motion by 5 degrees in all planes.  Negative straight leg test.  Deep tendon reflexes intact and symmetric.  Osteopathic findings C2 flexed rotated and side bent right T3 extended rotated and side bent right inhaled third rib T7 extended rotated and side bent left L2 flexed rotated and side bent right Sacrum r the left    Impression and Recommendations:     This case required medical decision making of moderate complexity. The above documentation has been reviewed and is accurate and complete Lisa Saa, DO       Note: This dictation was prepared with Dragon dictation along with smaller phrase technology. Any transcriptional errors that result from this process are unintentional.

## 2018-05-24 DIAGNOSIS — Z23 Encounter for immunization: Secondary | ICD-10-CM

## 2018-05-24 DIAGNOSIS — E039 Hypothyroidism, unspecified: Secondary | ICD-10-CM | POA: Diagnosis not present

## 2018-05-24 DIAGNOSIS — Z0000 Encounter for general adult medical examination without abnormal findings: Secondary | ICD-10-CM | POA: Diagnosis not present

## 2018-05-25 ENCOUNTER — Ambulatory Visit: Payer: Medicare Other | Admitting: Family Medicine

## 2018-05-25 ENCOUNTER — Encounter: Payer: Self-pay | Admitting: Family Medicine

## 2018-05-25 VITALS — BP 108/72 | HR 37 | Ht 61.5 in | Wt 144.0 lb

## 2018-05-25 DIAGNOSIS — G8929 Other chronic pain: Secondary | ICD-10-CM

## 2018-05-25 DIAGNOSIS — M999 Biomechanical lesion, unspecified: Secondary | ICD-10-CM

## 2018-05-25 DIAGNOSIS — M545 Low back pain, unspecified: Secondary | ICD-10-CM

## 2018-05-25 DIAGNOSIS — M9983 Other biomechanical lesions of lumbar region: Secondary | ICD-10-CM

## 2018-05-25 NOTE — Assessment & Plan Note (Signed)
Patient does have some degenerative disc disease.  We discussed icing regimen and home exercise.  Discussed which activities to do which wants to avoid.  Patient should do relatively well.

## 2018-05-25 NOTE — Patient Instructions (Signed)
God to see you  Lisa [name] is your friend Stay active Try to keep hands within peripheral vision  See me again in 8 weeks  Tart cherry extract [employer]

## 2018-05-25 NOTE — Assessment & Plan Note (Signed)
Decision today to treat with OMT was based on Physical Exam  After verbal consent patient was treated with HVLA, ME, FPR techniques in cervical, thoracic, rib,  lumbar and sacral areas  Patient tolerated the procedure well with improvement in symptoms  Patient given exercises, stretches and lifestyle modifications  See medications in patient instructions if given  Patient will follow up in 4-8 weeks 

## 2018-06-18 ENCOUNTER — Other Ambulatory Visit: Payer: Self-pay | Admitting: Family Medicine

## 2018-06-18 DIAGNOSIS — G4700 Insomnia, unspecified: Secondary | ICD-10-CM

## 2018-06-19 ENCOUNTER — Other Ambulatory Visit: Payer: Self-pay | Admitting: *Deleted

## 2018-06-29 ENCOUNTER — Other Ambulatory Visit: Payer: Self-pay | Admitting: Family Medicine

## 2018-06-29 DIAGNOSIS — G4700 Insomnia, unspecified: Secondary | ICD-10-CM

## 2018-07-19 NOTE — Progress Notes (Signed)
Chief Complaint  Patient presents with  . Hypertension    follow up, patient brought in list with her today. Has B/L cataract surgery end of Aug/begininning of Oct.    Patient presents to follow up on blood pressure.  She is compliant with taking her Avapro 300mg daily. At her physical in October her BP was above goal. She forgot to bring her BP log to that visit. She has been monitoring BP at home, and brings in BP log today. BP was checked just a few times, ranging from 108/72 to 159/80.    She feels relaxed overall, since husband is in a better living situation.  Had her first Shingrix in November.  PMH, PSH, SH reviewed  Outpatient Encounter Medications as of 07/20/2018  Medication Sig Note  . aspirin 81 MG tablet Take 81 mg by mouth daily.     Marland Kitchen. atorvastatin (LIPITOR) 20 MG tablet Take 1 tablet (20 mg total) by mouth daily.   . Calcium Carbonate-Vitamin D (CALTRATE 600+D PO) Take 1 tablet by mouth 2 (two) times daily.     . cholecalciferol (VITAMIN D) 1000 units tablet Take 1,000 Units by mouth daily.   Marland Kitchen. escitalopram (LEXAPRO) 10 MG tablet Take 1 tablet (10 mg total) by mouth daily.   . irbesartan (AVAPRO) 300 MG tablet Take 1 tablet (300 mg total) by mouth daily.   . Multiple Vitamins-Minerals (CENTRUM SILVER PO) Take 1 tablet by mouth daily.     . QUEtiapine (SEROQUEL) 25 MG tablet TAKE 1 TABLET BY MOUTH AT  BEDTIME   . SYNTHROID 50 MCG tablet Take 1 tablet (50 mcg total) by mouth daily before breakfast.   . ALPRAZolam (XANAX) 0.25 MG tablet TAKE 1 TABLET BY MOUTH 3 TIMES A DAY AS NEEDED FOR ANXIETY   . fluocinonide (LIDEX) 0.05 % gel Apply 1 application topically 4 (four) times daily. Reported on 12/12/2015 05/12/2017: Uses prn for canker sores; much better since changer her drinking water   No facility-administered encounter medications on file as of 07/20/2018.    Allergies  Allergen Reactions  . Amoxicillin Rash  . Erythromycin Rash  . Sulfa Antibiotics Rash   ROS:  Deniesheadaches, dizziness, chest pain, palpitations. Denies edema. No fever, chills, URI symptoms, cough, shortness of breath.  Moods are good   PHYSICAL EXAM:  BP 130/80   Pulse 64   Ht 5' 1.5" (1.562 m)   Wt 144 lb (65.3 kg)   BMI 26.77 kg/m    136/76 on repeat by MD  Wt Readings from Last 3 Encounters:  07/20/18 144 lb (65.3 kg)  05/25/18 144 lb (65.3 kg)  05/18/18 144 lb (65.3 kg)   Well appearing, pleasant female, in good spirits HEENT: conjunctiva and sclera are clear, EOMI, OP clear Neck: no lymphadenopathy, thyromegaly or mass Heart: regular rate and rhythm Lungs: clear bilaterally Extremities: no edema Skin: normal turgor, no rash Psych: normal mood, affect, hygiene and grooming Neuro: alert and oriented, cranial nerves intact, normal gait   ASSESSMENT/PLAN:   Essential hypertension, benign - overall BP okay, wide fluctuations at home. Continue current meds, reviewed/reminded of low Na diet, cont regular exercise  F/u as scheduled in April, sooner prn

## 2018-07-20 ENCOUNTER — Encounter: Payer: Self-pay | Admitting: Family Medicine

## 2018-07-20 ENCOUNTER — Ambulatory Visit: Payer: Medicare Other | Admitting: Family Medicine

## 2018-07-20 VITALS — BP 130/80 | HR 64 | Ht 61.5 in | Wt 144.0 lb

## 2018-07-20 VITALS — BP 120/82 | HR 70 | Ht 61.5 in | Wt 144.0 lb

## 2018-07-20 DIAGNOSIS — M999 Biomechanical lesion, unspecified: Secondary | ICD-10-CM | POA: Diagnosis not present

## 2018-07-20 DIAGNOSIS — M545 Low back pain, unspecified: Secondary | ICD-10-CM

## 2018-07-20 DIAGNOSIS — I10 Essential (primary) hypertension: Secondary | ICD-10-CM | POA: Diagnosis not present

## 2018-07-20 DIAGNOSIS — M9981 Other biomechanical lesions of cervical region: Secondary | ICD-10-CM

## 2018-07-20 DIAGNOSIS — G8929 Other chronic pain: Secondary | ICD-10-CM | POA: Diagnosis not present

## 2018-07-20 NOTE — Progress Notes (Signed)
[name] [address] [place], [place] [address] Phone: [phone] Subjective:   Lisa [name], am serving as a scribe for Dr. Antoine Primas.    CC: Back pain follow-up  [id number]  Lisa [name] is a 77 y.o. female coming in with complaint of neck and upper back pain. She said that she has had an increase in tightness that was found during her massage.  Patient has been doing relatively well.  Some mild tightness recently.  Nothing quite as severe as what she has had in the past.  Patient states she is feels more stiff.  Trying to workout regularly but not doing it on a regular basis.    Past Medical History:  Diagnosis Date  . Allergy   . Colon polyps    hyperplastic  . Hypercholesteremia   . Hypertension   . Insomnia   . Leukopenia   . Multinodular thyroid   . OA (osteoarthritis)    hips, knees and LS  . Osteopenia   . Psoriasis   . Shingles 08/2016   left forehead  . Vitamin D deficiency    Past Surgical History:  Procedure Laterality Date  . cardiolyte  02-2002  . CATARACT EXTRACTION, BILATERAL Bilateral 04/05/18 and 05/09/18   Dr. Ranae Pila ([place] [facility])  . COLONOSCOPY  12-2006  . FLEXIBLE SIGMOIDOSCOPY  05-2000  . HAND TENDON SURGERY  1979   L 4th finger  . KNEE SURGERY  1969   Left, cartilage  . TONSILLECTOMY AND ADENOIDECTOMY     Social History   Socioeconomic History  . Marital status: Married    Spouse name: Not on file  . Number of children: 2  . Years of education: Not on file  . Highest education level: Not on file  Occupational History  . 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 Financial resource strain: Not on file  . Food insecurity:    Worry: Not on file    Inability: Not on file  . Transportation needs:    Medical: Not on file    Non-medical: Not on file  Tobacco Use  . Smoking status: Never Smoker  . Smokeless tobacco: Never Used  Substance and  Sexual Activity  . Alcohol use: Yes    Alcohol/week: 0.0 standard drinks    Comment: socially, 2 drinks twice weekly  . Drug use: No  . Sexual activity: Not Currently    Partners: Male  Lifestyle  . Physical activity:    Days per week: Not on file    Minutes per session: Not on file  . Stress: Not on file  Relationships  . Social connections:    Talks on phone: Not on file    Gets together: Not on file    Attends religious service: Not on file    Active member of club or organization: Not on file    Attends meetings of clubs or organizations: Not on file    Relationship status: Not on file  Other Topics Concern  . Not on file  Social History Narrative   Lives alone. Husband moved to [facility] in [place] (memory care), after having been at [facility] (just under a year).  Son is in [place] (with 2 grandchildren there), daughter and 1 grandson and great-granddaughter were local, moved to [facility] 2019.   Allergies  Allergen Reactions  . Amoxicillin Rash  . Erythromycin Rash  . Sulfa Antibiotics Rash  Family History  Problem Relation Age of Onset  . Allergies Mother   . Hypertension Mother   . Vision loss Mother   . Heart disease Mother   . Arthritis Mother   . Arthritis Father   . Heart disease Father [id number]  . Hearing loss Sister   . Hypertension Sister   . Hyperlipidemia Sister   . Arthritis Sister   . Heart disease Sister [id number]       massive MI  . Arthritis Sister   . Heart disease Sister   . Hyperlipidemia Sister   . Hypertension Sister   . Arthritis Sister   . Hypertension Sister   . Hyperlipidemia Sister   . Thyroid disease Sister   . Diabetes Sister [id number]       diet controlled  . Vision loss Sister        macular degeneration  . Stroke Maternal Grandmother   . Heart disease Maternal Grandfather   . Stroke Maternal Grandfather   . Heart disease Paternal Grandmother   . Stroke Paternal Grandmother   . Cancer Neg Hx     Current Outpatient Medications  (Endocrine & Metabolic):  .  SYNTHROID 50 MCG tablet, Take 1 tablet (50 mcg total) by mouth daily before breakfast.  Current Outpatient Medications (Cardiovascular):  .  atorvastatin (LIPITOR) 20 MG tablet, Take 1 tablet (20 mg total) by mouth daily. .  irbesartan (AVAPRO) 300 MG tablet, Take 1 tablet (300 mg total) by mouth daily.   Current Outpatient Medications (Analgesics):  .  aspirin 81 MG tablet, Take 81 mg by mouth daily.     Current Outpatient Medications (Other):  Marland Kitchen.  ALPRAZolam (XANAX) 0.25 MG tablet, TAKE 1 TABLET BY MOUTH 3 TIMES A DAY AS NEEDED FOR ANXIETY .  Calcium Carbonate-Vitamin D (CALTRATE 600+D PO), Take 1 tablet by mouth 2 (two) times daily.   .  cholecalciferol (VITAMIN D) 1000 units tablet, Take 1,000 Units by mouth daily. Marland Kitchen.  escitalopram (LEXAPRO) 10 MG tablet, Take 1 tablet (10 mg total) by mouth daily. .  fluocinonide (LIDEX) 0.05 % gel, Apply 1 application topically 4 (four) times daily. Reported on 12/12/2015 .  Multiple Vitamins-Minerals (CENTRUM SILVER PO), Take 1 tablet by mouth daily.   .  QUEtiapine (SEROQUEL) 25 MG tablet, TAKE 1 TABLET BY MOUTH AT  BEDTIME    Past medical history, social, surgical and family history all reviewed in electronic medical record.  No pertanent information unless stated regarding to the chief complaint.   Review of Systems:  No headache, visual changes, nausea, vomiting, diarrhea, constipation, dizziness, abdominal pain, skin rash, fevers, chills, night sweats, weight loss, swollen lymph nodes, body aches, joint swelling, , chest pain, shortness of breath, mood changes.  Positive muscle aches  Objective  Blood pressure 120/82, pulse 70, height 5' 1.5" (1.562 m), weight 144 lb (65.3 kg), SpO2 96 %.    General: No apparent distress alert and oriented x3 mood and affect normal, dressed appropriately.  HEENT: Pupils equal, extraocular movements intact  Respiratory: Patient's speak in full sentences and does not appear short  of breath  Cardiovascular: No lower extremity edema, non tender, no erythema  Skin: Warm dry intact with no signs of infection or rash on extremities or on axial skeleton.  Abdomen: Soft nontender  Neuro: Cranial nerves II through XII are intact, neurovascularly intact in all extremities with 2+ DTRs and 2+ pulses.  Lymph: No lymphadenopathy of posterior or anterior cervical chain or axillae bilaterally.  Gait normal with  good balance and coordination.  MSK: Mild tender with full range of motion and good stability and symmetric strength and tone of shoulders, elbows, wrist, hip, knee and ankles bilaterally.  Low back exam does show some degenerative scoliosis.  Some tightness in the paraspinal musculature.  Positive Faber test on the left side.  Negative straight leg test.  Patient's range of motion of the back does show some mild decrease especially in extension and side bending to the right  Osteopathic findings C7 flexed rotated and side bent right T3 extended rotated and side bent right inhaled third rib T5 extended rotated and side bent left L2 flexed rotated and side bent right Sacrum right on right     Impression and Recommendations:     This case required medical decision making of moderate complexity. The above documentation has been reviewed and is accurate and complete Judi Saa, DO       Note: This dictation was prepared with Dragon dictation along with smaller phrase technology. Any transcriptional errors that result from this process are unintentional.

## 2018-07-20 NOTE — Patient Instructions (Signed)
Good to see you  Try the brace at night Stay active Overall you are doing well  See me again in 2-3 months  Happy holidays!

## 2018-07-20 NOTE — Patient Instructions (Signed)
Your blood pressure in the office is lower than most of the values on your sheet. Given that you did have one very low value on your sheet, and it being normal here, I'm not going to make any medication changes (even though some of the ones at home were high).  Continue on the 300mg  Avapro. Continue your regular exercise, and watching your sodium intake.

## 2018-07-21 ENCOUNTER — Encounter: Payer: Self-pay | Admitting: Family Medicine

## 2018-07-21 NOTE — Assessment & Plan Note (Signed)
Low back pain is multifactorial.  Mild degenerative scoliosis noted.  Discussed with patient about posture and ergonomics.  Increasing activity slowly.  Patient will avoid certain high-impact exercise.  Follow-up with me again in 4 to 8 weeks

## 2018-07-21 NOTE — Assessment & Plan Note (Signed)
Decision today to treat with OMT was based on Physical Exam  After verbal consent patient was treated with HVLA, ME, FPR techniques in cervical, thoracic, rib lumbar and sacral areas  Patient tolerated the procedure well with improvement in symptoms  Patient given exercises, stretches and lifestyle modifications  See medications in patient instructions if given  Patient will follow up in 4-8 weeks 

## 2018-07-25 ENCOUNTER — Telehealth: Payer: Self-pay

## 2018-07-25 NOTE — Telephone Encounter (Signed)
Copied from CRM [id number]. Topic: General - Other >> Jul 21, 2018 12:19 PM Gaynelle AduPoole, Shalonda wrote: Reason for CRM: patient  daughter Revonda Standardallison is calling to state she wants to get her mother a surprise christmas gift she is wanting to know the  best size of compression gloves to buy the patient . The patient see Dr. Antoine PrimasZachary Smith. Revonda Standardllison is hoping to get a call back  as soon as possible Trixie Deisllison Grandinetti- contact number is [phone]

## 2018-07-25 NOTE — Telephone Encounter (Signed)
Spoke with patient's daughter recommending the arthritis gloves IMAK.

## 2018-07-29 ENCOUNTER — Other Ambulatory Visit: Payer: Self-pay | Admitting: Family Medicine

## 2018-07-29 DIAGNOSIS — G4700 Insomnia, unspecified: Secondary | ICD-10-CM

## 2018-07-31 NOTE — Telephone Encounter (Signed)
Is this okay to refill? Looks like it might be a little bit early.

## 2018-08-03 ENCOUNTER — Telehealth: Payer: Self-pay

## 2018-08-03 DIAGNOSIS — I10 Essential (primary) hypertension: Secondary | ICD-10-CM

## 2018-08-03 MED ORDER — IRBESARTAN 300 MG PO TABS: 300.0000 mg | ORAL_TABLET | Freq: Every day | ORAL | 0 refills | Status: DC

## 2018-08-03 NOTE — Telephone Encounter (Signed)
[facility] sent a fax requesting an alternative for Irbesartan because it is on back order possibly for the next 6 to 12 months. Losartan 50mg  and 100mg  and HCTZ 12.5mg  are currently in stock but you would have to send it as two separate prescriptions. Please advise.

## 2018-08-03 NOTE — Telephone Encounter (Signed)
Check to see if 150mg  is available (where she can take two, to equal her 300mg  tablet, vs check to see if pt wants till elsewhere, as opposed to changing the medication entirely.

## 2018-09-16 ENCOUNTER — Other Ambulatory Visit: Payer: Self-pay | Admitting: Family Medicine

## 2018-09-16 DIAGNOSIS — G4700 Insomnia, unspecified: Secondary | ICD-10-CM

## 2018-09-26 ENCOUNTER — Ambulatory Visit: Payer: Medicare Other | Admitting: Family Medicine

## 2018-09-27 NOTE — Progress Notes (Signed)
[name] [address] [place], [place] [address] Phone: [phone] Subjective:    I Ronelle Nigh am serving as a Neurosurgeon for Dr. Antoine Primas.    CC: Back pain follow-up  TKP:[id number]  Lisa [name] is a 78 y.o. female coming in with complaint of back pain. Massage this morning. States that she is doing well. Patient has been doing relatively well.  More stress recently though.  Has had a couple deaths in the family and is a little more stress.     Past Medical History:  Diagnosis Date  . Allergy   . Colon polyps    hyperplastic  . Hypercholesteremia   . Hypertension   . Insomnia   . Leukopenia   . Multinodular thyroid   . OA (osteoarthritis)    hips, knees and LS  . Osteopenia   . Psoriasis   . Shingles 08/2016   left forehead  . Vitamin D deficiency    Past Surgical History:  Procedure Laterality Date  . cardiolyte  02-2002  . CATARACT EXTRACTION, BILATERAL Bilateral 04/05/18 and 05/09/18   Dr. Ranae Pila ([place] [facility])  . COLONOSCOPY  12-2006  . FLEXIBLE SIGMOIDOSCOPY  05-2000  . HAND TENDON SURGERY  1979   L 4th finger  . KNEE SURGERY  1969   Left, cartilage  . TONSILLECTOMY AND ADENOIDECTOMY     Social History   Socioeconomic History  . Marital status: Married    Spouse name: Not on file  . Number of children: 2  . Years of education: Not on file  . Highest education level: Not on file  Occupational History  . 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 Financial resource strain: Not on file  . Food insecurity:    Worry: Not on file    Inability: Not on file  . Transportation needs:    Medical: Not on file    Non-medical: Not on file  Tobacco Use  . Smoking status: Never Smoker  . Smokeless tobacco: Never Used  Substance and Sexual Activity  . Alcohol use: Yes    Alcohol/week: 0.0 standard drinks    Comment: socially, 2 drinks twice weekly  . Drug use: No  .  Sexual activity: Not Currently    Partners: Male  Lifestyle  . Physical activity:    Days per week: Not on file    Minutes per session: Not on file  . Stress: Not on file  Relationships  . Social connections:    Talks on phone: Not on file    Gets together: Not on file    Attends religious service: Not on file    Active member of club or organization: Not on file    Attends meetings of clubs or organizations: Not on file    Relationship status: Not on file  Other Topics Concern  . Not on file  Social History Narrative   Lives alone. Husband moved to [facility] in [place] (memory care), after having been at [facility] (just under a year).  Son is in [place] (with 2 grandchildren there), daughter and 1 grandson and great-granddaughter were local, moved to [facility] 2019.   Allergies  Allergen Reactions  . Amoxicillin Rash  . Erythromycin Rash  . Sulfa Antibiotics Rash   Family History  Problem Relation Age of Onset  . Allergies Mother   . Hypertension Mother   . Vision loss Mother   . Heart  disease Mother   . Arthritis Mother   . Arthritis Father   . Heart disease Father [id number]  . Hearing loss Sister   . Hypertension Sister   . Hyperlipidemia Sister   . Arthritis Sister   . Heart disease Sister [id number]       massive MI  . Arthritis Sister   . Heart disease Sister   . Hyperlipidemia Sister   . Hypertension Sister   . Arthritis Sister   . Hypertension Sister   . Hyperlipidemia Sister   . Thyroid disease Sister   . Diabetes Sister 3672       diet controlled  . Vision loss Sister        macular degeneration  . Stroke Maternal Grandmother   . Heart disease Maternal Grandfather   . Stroke Maternal Grandfather   . Heart disease Paternal Grandmother   . Stroke Paternal Grandmother   . Cancer Neg Hx     Current Outpatient Medications (Endocrine & Metabolic):  .  SYNTHROID 50 MCG tablet, Take 1 tablet (50 mcg total) by mouth daily before breakfast.  Current Outpatient  Medications (Cardiovascular):  .  atorvastatin (LIPITOR) 20 MG tablet, Take 1 tablet (20 mg total) by mouth daily. .  irbesartan (AVAPRO) 300 MG tablet, Take 1 tablet (300 mg total) by mouth daily.   Current Outpatient Medications (Analgesics):  .  aspirin 81 MG tablet, Take 81 mg by mouth daily.     Current Outpatient Medications (Other):  Marland Kitchen.  ALPRAZolam (XANAX) 0.25 MG tablet, TAKE 1 TABLET BY MOUTH 3 TIMES A DAY AS NEEDED FOR ANXIETY .  Calcium Carbonate-Vitamin D (CALTRATE 600+D PO), Take 1 tablet by mouth 2 (two) times daily.   .  cholecalciferol (VITAMIN D) 1000 units tablet, Take 1,000 Units by mouth daily. Marland Kitchen.  escitalopram (LEXAPRO) 10 MG tablet, Take 1 tablet (10 mg total) by mouth daily. .  fluocinonide (LIDEX) 0.05 % gel, Apply 1 application topically 4 (four) times daily. Reported on 12/12/2015 .  Multiple Vitamins-Minerals (CENTRUM SILVER PO), Take 1 tablet by mouth daily.   .  QUEtiapine (SEROQUEL) 25 MG tablet, TAKE 1 TABLET BY MOUTH AT  BEDTIME    Past medical history, social, surgical and family history all reviewed in electronic medical record.  No pertanent information unless stated regarding to the chief complaint.   Review of Systems:  No headache, visual changes, nausea, vomiting, diarrhea, constipation, dizziness, abdominal pain, skin rash, fevers, chills, night sweats, weight loss, swollen lymph nodes, body aches, joint swelling, , chest pain, shortness of breath, mood changes.  Positive muscle aches  Objective  Blood pressure 126/88, pulse 85, height 5\' 1"  (1.549 m), weight 144 lb (65.3 kg), SpO2 97 %.    General: No apparent distress alert and oriented x3 mood and affect normal, dressed appropriately.  HEENT: Pupils equal, extraocular movements intact  Respiratory: Patient's speak in full sentences and does not appear short of breath  Cardiovascular: No lower extremity edema, non tender, no erythema  Skin: Warm dry intact with no signs of infection or rash on  extremities or on axial skeleton.  Abdomen: Soft nontender  Neuro: Cranial nerves II through XII are intact, neurovascularly intact in all extremities with 2+ DTRs and 2+ pulses.  Lymph: No lymphadenopathy of posterior or anterior cervical chain or axillae bilaterally.  Gait normal with good balance and coordination.  MSK:  tender with mild limited range of motion and good stability and symmetric strength and tone of shoulders, elbows, wrist, hip,  knee and ankles bilaterally.  Arthritic changes of multiple joints Low back pain does have some degenerative scoliosis with mild loss of lordosis.  Loss of at least 10 degrees in all planes of motion.  Tightness with Pearlean Brownie test.  Pain over the right sacroiliac joint tightness of the hamstrings but no radicular symptoms.  Osteopathic findings  C6 flexed rotated and side bent right T3 extended rotated and side bent right inhaled third rib T6 extended rotated and side bent left L2 flexed rotated and side bent right Sacrum right on right     Impression and Recommendations:     This case required medical decision making of moderate complexity. The above documentation has been reviewed and is accurate and complete Judi Saa, DO       Note: This dictation was prepared with Dragon dictation along with smaller phrase technology. Any transcriptional errors that result from this process are unintentional.

## 2018-09-28 ENCOUNTER — Encounter: Payer: Self-pay | Admitting: Family Medicine

## 2018-09-28 ENCOUNTER — Ambulatory Visit: Payer: Medicare Other | Admitting: Family Medicine

## 2018-09-28 VITALS — BP 126/88 | HR 85 | Ht 61.0 in | Wt 144.0 lb

## 2018-09-28 DIAGNOSIS — M9902 Segmental and somatic dysfunction of thoracic region: Secondary | ICD-10-CM

## 2018-09-28 DIAGNOSIS — M999 Biomechanical lesion, unspecified: Secondary | ICD-10-CM | POA: Diagnosis not present

## 2018-09-28 DIAGNOSIS — M9904 Segmental and somatic dysfunction of sacral region: Secondary | ICD-10-CM

## 2018-09-28 DIAGNOSIS — M9901 Segmental and somatic dysfunction of cervical region: Secondary | ICD-10-CM | POA: Diagnosis not present

## 2018-09-28 DIAGNOSIS — M9908 Segmental and somatic dysfunction of rib cage: Secondary | ICD-10-CM

## 2018-09-28 DIAGNOSIS — M9903 Segmental and somatic dysfunction of lumbar region: Secondary | ICD-10-CM

## 2018-09-28 DIAGNOSIS — M545 Low back pain: Secondary | ICD-10-CM | POA: Diagnosis not present

## 2018-09-28 DIAGNOSIS — G8929 Other chronic pain: Secondary | ICD-10-CM | POA: Diagnosis not present

## 2018-09-28 NOTE — Assessment & Plan Note (Signed)
Decision today to treat with OMT was based on Physical Exam  After verbal consent patient was treated with HVLA, ME, FPR techniques in cervical, thoracic, rib lumbar and sacral areas  Patient tolerated the procedure well with improvement in symptoms  Patient given exercises, stretches and lifestyle modifications  See medications in patient instructions if given  Patient will follow up in 8-12 weeks 

## 2018-09-28 NOTE — Assessment & Plan Note (Signed)
Degenerative arthritic changes.  Discussed icing regimen and home exercise.  Discussed core stability.  Patient has responded fairly well to conservative therapy including osteopathic manipulation.  Follow-up with me again 2 to 3 months

## 2018-09-28 NOTE — Patient Instructions (Signed)
Good to see you  Ice is your friend Stay active Travel safe  Do something for yourself  See me again in 2-3 month

## 2018-09-29 ENCOUNTER — Other Ambulatory Visit: Payer: Self-pay | Admitting: Family Medicine

## 2018-09-29 DIAGNOSIS — F411 Generalized anxiety disorder: Secondary | ICD-10-CM

## 2018-09-29 DIAGNOSIS — E7800 Pure hypercholesterolemia, unspecified: Secondary | ICD-10-CM

## 2018-09-29 NOTE — Telephone Encounter (Signed)
Is this okay to refill? 

## 2018-09-29 NOTE — Telephone Encounter (Signed)
Left message for pt to call me back 

## 2018-09-29 NOTE — Telephone Encounter (Signed)
Just as with her lipitor that you refilled--these were both filled for #90 with 1 refill (6 month) in October, so shouldn't need refill until April (which is when her next appointment is).  I'm not sure why they are requesting the refill so early, she shouldn't need it yet.  Okay to fill #90 now, if she prefers that rather than waiting, but she should still have at least 6 weeks worth of meds left

## 2018-09-29 NOTE — Telephone Encounter (Signed)
Pt thinks she has enough to get her appt. I have called and canceled the liptior refill. I will look like it was refilled but its has not been.

## 2018-10-01 ENCOUNTER — Other Ambulatory Visit: Payer: Self-pay | Admitting: Family Medicine

## 2018-10-01 DIAGNOSIS — G4700 Insomnia, unspecified: Secondary | ICD-10-CM

## 2018-10-02 NOTE — Telephone Encounter (Signed)
Is this okay to refill? 

## 2018-10-02 NOTE — Telephone Encounter (Signed)
Refilled 12/23, so this is a month early. Sometimes mail order does this.  I sent refill, bc she will not have enough until her April visit

## 2018-11-13 ENCOUNTER — Encounter: Payer: Self-pay | Admitting: *Deleted

## 2018-11-15 NOTE — Progress Notes (Signed)
Start time: 10:52 End time: 11:20  Virtual Visit via Video Note  I connected with Lisa [name] on 11/16/2018 by a video enabled telemedicine application and verified that I am speaking with the correct person using two identifiers. Patient is at home, alone. MD is in office.   I discussed the limitations of evaluation and management by telemedicine and the availability of in person appointments. The patient expressed understanding and agreed to proceed.  History of Present Illness:  Chief Complaint  Patient presents with  . Hypertension    virtual med check. No concerns.     Hypertension follow-up: She is compliant with taking her Avapro 300mg daily.  Deniesheadaches, dizziness, chest pain, palpitations. Denies edema; denies side effects.  BP's since December have been running: 3 readings of 120's or under 8 in the 130's 3 in the 140's Most recent readings are 138/68 4/7 and 140/74 on 4/6.  Daughter has been staying with her most of the time during the Stay at Home order, and she is enjoying time with her. Has done some walking in the neighborhood.  Hyperlipidemia follow-up: Patient is reportedly following a low-fat, low cholesterol diet. Compliant with medications (atorvastatin 20mg ) and denies medication side effects. She was at goal per last check. Lab Results  Component Value Date   CHOL 153 05/16/2018   HDL 62 05/16/2018   LDLCALC 76 05/16/2018   TRIG 74 05/16/2018   CHOLHDL 2.5 05/16/2018    Hypothyroidism: Thyroid biopsy in 2011 was benign; had seen Dr. Talmage Nap in the past, but we are now managing her medications. Denies fatigue, hair, skin, bowel changes. Denies any mood changes. Denies dysphagia or any thyroid concerns. Takes Synthroid on empty stomach, separate from food and other medications. TSH at goal on last check. Lab Results  Component Value Date   TSH 2.610 05/16/2018   Insomnia: Well controlled with Seroquel.  Anxiety: She is taking10mg  of  lexapro without side effects, and feels that she is doing well with respect to her moods and anxiety. "Going with the flow", enjoying have her daughter with her, catching up with old friends. Can't recall the last time she needed to take alprazolam. Husband has cognitive deficits, is in a facility.  She continues to seeDr. Terrilee Files for joint pains (neck, back). She goes every few months, and this is helpful.She is doing her home exercises.She has been getting monthly massages for herself, which she likes, and is helpful (since she turned 75). Her back/neck have been doing well.  Seasonal allergies, sometimes triggers mild vertigo. She is taking cetirizine 10mg  daily and denies any vertigo.  PMH, PSH, SH reviewed  Outpatient Encounter Medications as of 11/16/2018  Medication Sig Note  . aspirin 81 MG tablet Take 81 mg by mouth daily.     Marland Kitchen atorvastatin (LIPITOR) 20 MG tablet TAKE 1 TABLET BY MOUTH  DAILY   . Calcium Carbonate-Vitamin D (CALTRATE 600+D PO) Take 1 tablet by mouth 2 (two) times daily.     . cholecalciferol (VITAMIN D) 1000 units tablet Take 1,000 Units by mouth daily.   Marland Kitchen escitalopram (LEXAPRO) 10 MG tablet Take 1 tablet (10 mg total) by mouth daily.   . irbesartan (AVAPRO) 300 MG tablet Take 1 tablet (300 mg total) by mouth daily.   . Multiple Vitamins-Minerals (CENTRUM SILVER PO) Take 1 tablet by mouth daily.     . QUEtiapine (SEROQUEL) 25 MG tablet TAKE 1 TABLET BY MOUTH AT  BEDTIME   . SYNTHROID 50 MCG tablet Take 1  tablet (50 mcg total) by mouth daily before breakfast.   . ALPRAZolam (XANAX) 0.25 MG tablet TAKE 1 TABLET BY MOUTH 3 TIMES A DAY AS NEEDED FOR ANXIETY (Patient not taking: Reported on 11/16/2018)   . fluocinonide (LIDEX) 0.05 % gel Apply 1 application topically 4 (four) times daily. Reported on 12/12/2015 05/12/2017: Uses prn for canker sores; much better since changer her drinking water   No facility-administered encounter medications on file as of 11/16/2018.     Allergies  Allergen Reactions  . Amoxicillin Rash  . Erythromycin Rash  . Sulfa Antibiotics Rash    ROS:  No headaches, dizziness, vertigo, URI symptoms. Allergies are controlled. No chest pain, palpitations, shortness of breath, edema, rashes, GI complaints, joint pains (neck and back pain are improved), depression, anxiety. Moods are good.  Observations/Objective:  BP (!) 141/83   Pulse 70   Ht 5\' 1"  (1.549 m)   Wt 144 lb (65.3 kg)   BMI 27.21 kg/m    Exam is limited due to virtual nature of the visit. On video, she appears well.  She is in good spirits. She is alert, oriented, cranial nerves intact. Conjunctiva and sclera appear clear. Normal eye contact, speech.   Assessment and Plan:  Essential hypertension, benign - controlled on Avapro 300mg , continue. Encouraged low sodium diet, regular exercise at home/walking, (since no longer can go to senior center)  Pure hypercholesterolemia - continue atorvastatin and lowfat, low cholesterol diet  Hypothyroidism, unspecified type - euthyroid by history, and last TSH normal. Continue current dose of Synthroid  Generalized anxiety disorder - well controlled, continue lexapro - Plan: escitalopram (LEXAPRO) 10 MG tablet  Insomnia, unspecified type - well controlled, continue seroquel   Follow Up Instructions:  F/u 6 months for AWV/CPE/med check with labs prior, as is already scheduled.    I discussed the assessment and treatment plan with the patient. The patient was provided an opportunity to ask questions and all were answered. The patient agreed with the plan and demonstrated an understanding of the instructions.   The patient was advised to call back or seek an in-person evaluation if the symptoms worsen or if the condition fails to improve as anticipated.  I provided 28 minutes of non-face-to-face time during this encounter.   Lavonda JumboEve A Olney Monier, MD

## 2018-11-16 ENCOUNTER — Encounter: Payer: Self-pay | Admitting: Family Medicine

## 2018-11-16 ENCOUNTER — Encounter: Payer: Medicare Other | Admitting: Family Medicine

## 2018-11-16 ENCOUNTER — Other Ambulatory Visit: Payer: Self-pay

## 2018-11-16 ENCOUNTER — Ambulatory Visit (INDEPENDENT_AMBULATORY_CARE_PROVIDER_SITE_OTHER): Payer: Medicare Other | Admitting: Family Medicine

## 2018-11-16 VITALS — BP 141/83 | HR 70 | Ht 61.0 in | Wt 144.0 lb

## 2018-11-16 DIAGNOSIS — F411 Generalized anxiety disorder: Secondary | ICD-10-CM

## 2018-11-16 DIAGNOSIS — G4700 Insomnia, unspecified: Secondary | ICD-10-CM | POA: Diagnosis not present

## 2018-11-16 DIAGNOSIS — I10 Essential (primary) hypertension: Secondary | ICD-10-CM | POA: Diagnosis not present

## 2018-11-16 DIAGNOSIS — E039 Hypothyroidism, unspecified: Secondary | ICD-10-CM | POA: Diagnosis not present

## 2018-11-16 DIAGNOSIS — E7800 Pure hypercholesterolemia, unspecified: Secondary | ICD-10-CM | POA: Diagnosis not present

## 2018-11-16 MED ORDER — ESCITALOPRAM OXALATE 10 MG PO TABS: 10.0000 mg | ORAL_TABLET | Freq: Every day | ORAL | 1 refills | Status: DC

## 2018-11-16 NOTE — Patient Instructions (Signed)
Continue your current medications. Be sure to try and continue to get regular exercise, 30 minutes daily (even if in 15 minutes blocks). Continue to engage with others via phone, zoom during this "stay at home" time. Do not go to [place].  Contact us if you have any problems or concerns. See you in October. Stay well!

## 2018-11-20 ENCOUNTER — Telehealth: Payer: Self-pay | Admitting: Family Medicine

## 2018-11-20 DIAGNOSIS — E7800 Pure hypercholesterolemia, unspecified: Secondary | ICD-10-CM

## 2018-11-20 MED ORDER — ATORVASTATIN CALCIUM 20 MG PO TABS: 20.0000 mg | ORAL_TABLET | Freq: Every day | ORAL | 0 refills | Status: DC

## 2018-11-20 NOTE — Telephone Encounter (Signed)
Done

## 2018-11-20 NOTE — Telephone Encounter (Signed)
Pharmacy sent in refill for atorvastatin 20 mg, I called pt and she has about a month supply left, pt needs it sent to the [facility] [place] [place] - [id number]

## 2018-12-09 ENCOUNTER — Other Ambulatory Visit: Payer: Self-pay | Admitting: Family Medicine

## 2018-12-09 DIAGNOSIS — G4700 Insomnia, unspecified: Secondary | ICD-10-CM

## 2018-12-11 NOTE — Telephone Encounter (Signed)
Is this okay to refill? 

## 2018-12-20 ENCOUNTER — Other Ambulatory Visit: Payer: Self-pay | Admitting: Family Medicine

## 2018-12-20 DIAGNOSIS — I10 Essential (primary) hypertension: Secondary | ICD-10-CM

## 2018-12-28 ENCOUNTER — Ambulatory Visit: Payer: Medicare Other | Admitting: Family Medicine

## 2019-01-21 ENCOUNTER — Other Ambulatory Visit: Payer: Self-pay | Admitting: Family Medicine

## 2019-01-21 DIAGNOSIS — E7800 Pure hypercholesterolemia, unspecified: Secondary | ICD-10-CM

## 2019-02-20 ENCOUNTER — Other Ambulatory Visit: Payer: Self-pay

## 2019-02-20 NOTE — Patient Outreach (Signed)
[name]) Care Management  02/20/2019  Lisa [name] [birth year] [id number]   Medication Adherence call to Lisa [name] Compliant Voice message left with a call back number. Lisa [name] is showing past due on Irbesartan 300 mg and Atorvastatin 20 mg under [facility].   [facility] Direct Dial [phone]  Fax [phone] [name]

## 2019-03-03 ENCOUNTER — Other Ambulatory Visit: Payer: Self-pay | Admitting: Family Medicine

## 2019-03-03 DIAGNOSIS — G4700 Insomnia, unspecified: Secondary | ICD-10-CM

## 2019-03-05 NOTE — Telephone Encounter (Signed)
[facility] is requesting to fill pt Seroquel Please advise [facility]

## 2019-03-29 ENCOUNTER — Other Ambulatory Visit: Payer: Self-pay | Admitting: Family Medicine

## 2019-03-29 DIAGNOSIS — E7800 Pure hypercholesterolemia, unspecified: Secondary | ICD-10-CM

## 2019-04-02 DIAGNOSIS — H903 Sensorineural hearing loss, bilateral: Secondary | ICD-10-CM | POA: Diagnosis not present

## 2019-04-14 ENCOUNTER — Other Ambulatory Visit: Payer: Self-pay | Admitting: Family Medicine

## 2019-04-14 DIAGNOSIS — E039 Hypothyroidism, unspecified: Secondary | ICD-10-CM

## 2019-04-17 NOTE — Telephone Encounter (Signed)
Has an upcoming appt soon

## 2019-04-20 ENCOUNTER — Other Ambulatory Visit: Payer: Self-pay | Admitting: Family Medicine

## 2019-04-20 DIAGNOSIS — F411 Generalized anxiety disorder: Secondary | ICD-10-CM

## 2019-04-23 NOTE — Telephone Encounter (Signed)
Has appt in October.  Year supply not written as requested, as not being done at visit, but likely doesn't have enough to last until visit

## 2019-04-27 DIAGNOSIS — H905 Unspecified sensorineural hearing loss: Secondary | ICD-10-CM | POA: Diagnosis not present

## 2019-05-18 ENCOUNTER — Other Ambulatory Visit: Payer: Self-pay | Admitting: Family Medicine

## 2019-05-18 DIAGNOSIS — G4700 Insomnia, unspecified: Secondary | ICD-10-CM

## 2019-05-18 NOTE — Telephone Encounter (Signed)
She has appt next week. Looks like it was last filled 7/27 so she should have enough to wait until visit and still get in mail on time

## 2019-05-18 NOTE — Telephone Encounter (Signed)
Is this okay to refill? 

## 2019-05-22 ENCOUNTER — Other Ambulatory Visit: Payer: Self-pay

## 2019-05-22 ENCOUNTER — Other Ambulatory Visit (INDEPENDENT_AMBULATORY_CARE_PROVIDER_SITE_OTHER): Payer: Medicare Other

## 2019-05-22 DIAGNOSIS — Z23 Encounter for immunization: Secondary | ICD-10-CM | POA: Diagnosis not present

## 2019-05-22 DIAGNOSIS — I10 Essential (primary) hypertension: Secondary | ICD-10-CM

## 2019-05-22 DIAGNOSIS — Z0000 Encounter for general adult medical examination without abnormal findings: Secondary | ICD-10-CM

## 2019-05-22 DIAGNOSIS — Z5181 Encounter for therapeutic drug level monitoring: Secondary | ICD-10-CM

## 2019-05-22 DIAGNOSIS — E7800 Pure hypercholesterolemia, unspecified: Secondary | ICD-10-CM | POA: Diagnosis not present

## 2019-05-22 DIAGNOSIS — E039 Hypothyroidism, unspecified: Secondary | ICD-10-CM

## 2019-05-23 LAB — COMPREHENSIVE METABOLIC PANEL
ALT: 22 IU/L (ref 0–32)
AST: 22 IU/L (ref 0–40)
Albumin/Globulin Ratio: 2 (ref 1.2–2.2)
Albumin: 4.5 g/dL (ref 3.7–4.7)
Alkaline Phosphatase: 65 IU/L (ref 39–117)
BUN/Creatinine Ratio: 10 — ABNORMAL LOW (ref 12–28)
BUN: 7 mg/dL — ABNORMAL LOW (ref 8–27)
Bilirubin Total: 0.4 mg/dL (ref 0.0–1.2)
CO2: 22 mmol/L (ref 20–29)
Calcium: 10 mg/dL (ref 8.7–10.3)
Chloride: 102 mmol/L (ref 96–106)
Creatinine, Ser: 0.68 mg/dL (ref 0.57–1.00)
GFR calc Af Amer: 97 mL/min/{1.73_m2} (ref >59)
GFR calc non Af Amer: 84 mL/min/{1.73_m2} (ref >59)
Globulin, Total: 2.2 g/dL (ref 1.5–4.5)
Glucose: 94 mg/dL (ref 65–99)
Potassium: 4.2 mmol/L (ref 3.5–5.2)
Sodium: 140 mmol/L (ref 134–144)
Total Protein: 6.7 g/dL (ref 6.0–8.5)

## 2019-05-23 LAB — CBC WITH DIFFERENTIAL/PLATELET
Basophils Absolute: 0.1 10*3/uL (ref 0.0–0.2)
Basos: 1 %
EOS (ABSOLUTE): 0.2 10*3/uL (ref 0.0–0.4)
Eos: 5 %
Hematocrit: 39.6 % (ref 34.0–46.6)
Hemoglobin: 13.2 g/dL (ref 11.1–15.9)
Immature Grans (Abs): 0 10*3/uL (ref 0.0–0.1)
Immature Granulocytes: 0 %
Lymphocytes Absolute: 1.5 10*3/uL (ref 0.7–3.1)
Lymphs: 36 %
MCH: 30.9 pg (ref 26.6–33.0)
MCHC: 33.3 g/dL (ref 31.5–35.7)
MCV: 93 fL (ref 79–97)
Monocytes Absolute: 0.5 10*3/uL (ref 0.1–0.9)
Monocytes: 11 %
Neutrophils Absolute: 2 10*3/uL (ref 1.4–7.0)
Neutrophils: 47 %
Platelets: 369 10*3/uL (ref 150–450)
RBC: 4.27 x10E6/uL (ref 3.77–5.28)
RDW: 12.7 % (ref 11.7–15.4)
WBC: 4.3 10*3/uL (ref 3.4–10.8)

## 2019-05-23 LAB — LIPID PANEL
Chol/HDL Ratio: 2.5 ratio (ref 0.0–4.4)
Cholesterol, Total: 151 mg/dL (ref 100–199)
HDL: 61 mg/dL (ref >39)
LDL Chol Calc (NIH): 75 mg/dL (ref 0–99)
Triglycerides: 80 mg/dL (ref 0–149)
VLDL Cholesterol Cal: 15 mg/dL (ref 5–40)

## 2019-05-23 LAB — TSH: TSH: 3 u[IU]/mL (ref 0.450–4.500)

## 2019-05-23 NOTE — Progress Notes (Signed)
Chief Complaint  Patient presents with  . medicare well visit    medicare well exam no issues     Lisa [name] is a 78 y.o. female who presents for annual physical, Medicare wellness visit and follow-up on chronic medical conditions.  She had labs done prior to her visit, see below.  Hypertension follow-up: She is compliant with taking her Avapro 371mdaily. Deniesheadaches, dizziness, chest pain, palpitations. Denies edema; denies side effects.  BP's are running 124-139/69-83, pulse 70-80  Hyperlipidemia follow-up: Patient is reportedly following a low-fat, low cholesterol diet. Compliant with medications (atorvastatin 228m and denies medication side effects.   Hypothyroidism: Thyroid biopsy in 2011 was benign; had seen Dr. BaChalmers Catern the past, but we are now managing her medications. Denies fatigue, hair, skin, bowel changes. Denies any mood changes. Denies dysphagia or any thyroid concerns. Takes Synthroid on empty stomach, separate from food and other medications.   Insomnia: Well controlled with Seroquel.  Anxiety: She is [id number] lexapro without side effects, and feels that she is doing well with respect to her moods and anxiety. At her virtual visit in April, she reported that her daughter was staying with her most of the time during the Stay at Home order, she enjoyed that, but by the end was "ready for her to leave"; she is enjoying her solitude now.  Her husband is doing well at facility, no COVID outbreaks, and he recognizes her when she visits.  She has seenDr. ZacCharlann Boxerr joint pains (neck, back). She goes every few months, which has been helpful, but hasn't been since 09/2018 due to COVThayerShe is doing her home exercises.She had been getting monthly massages for herself,which helped, took a break through COVBel Aired just recently went back. Her back/neck have been doing well.  Osteopenia: Takes Calcium with D BID, gets regular exercise, including weights  (with both the DVD and silver sneaker classes) She reports previously taking Actonel for 10 years, stopped it on her own, concerned about risks. Last DEXA 03/2017 T-1.8 at R fem neck  Seasonal allergies, sometimes triggers mild vertigo. She is taking cetirizine 69m69mily and denies any vertigo.  Immunization History  Administered Date(s) Administered  . Fluad Quad(high Dose 65+) 05/22/2019  . Influenza Whole 05/02/2010  . Influenza, High Dose Seasonal PF 05/16/2014, 04/23/2015, 05/05/2016, 05/12/2017, 05/24/2018  . Pneumococcal Conjugate-13 04/17/2014  . Pneumococcal Polysaccharide-23 09/21/2005  . Td 01/02/2002  . Tdap 01/13/2012  . Zoster 02/26/2011  . Zoster Recombinat (Shingrix) 05/20/2018, 08/21/2018   Last Pap smear: 04/2015, normal no high risk HPV Last mammo 03/2017 Colonoscopy 5/08 (refuses to do again); negative Cologuard 05/2017 Dexa 03/2017, T-1.8 at R fem neck. (2016 study showed -1.7 L fem neck). Abnormal FRAX at hip, 3.3% (was 3.8% in 2016) Dentist and ophtho regularly  Exercise: Pre-COVID went to [employer]--recently re-opened and is going 2x/week (50 min classes). Also does exercises DVD 2x/week (mix of cardio and weights), 20-30 minutes Vitamin D last checked 04/2015 normal at 40  Other doctors caring for patient include: Dentist: Dr. KordNoah Charontho: [facility] Cardiologist: Dr. KellClaiborne Billings longer sees) Endocrinologist: Dr. BalaChalmers Cater longer sees) Sports medicine: Dr. ZachCharlann Boxerpression screen: negative Fall screen: none Functional status survey notable for decrease in hearing (working on getting hearing aids). Slight leakage of urine, wears pantiliner Mini-Cog screen: Normal  End of Life Discussion: Patient hasa living will and medical power of attorney.   Past Medical History:  Diagnosis Date  . Allergy   . Colon polyps    hyperplastic  . Hypercholesteremia   . Hypertension   . Insomnia   .  Leukopenia   . Multinodular thyroid   . OA (osteoarthritis)    hips, knees and LS  . Osteopenia   . Psoriasis   . Shingles 08/2016   left forehead  . Vitamin D deficiency     Past Surgical History:  Procedure Laterality Date  . cardiolyte  02-2002  . CATARACT EXTRACTION, BILATERAL Bilateral 04/05/18 and 05/09/18   Dr. Gillian Scarce ([place] [facility])  . COLONOSCOPY  12-2006  . FLEXIBLE SIGMOIDOSCOPY  05-2000  . HAND TENDON SURGERY  1979   L 4th finger  . KNEE SURGERY  1969   Left, cartilage  . TONSILLECTOMY AND ADENOIDECTOMY      Social History   Socioeconomic History  . Marital status: Married    Spouse name: Not on file  . Number of children: 2  . Years of education: Not on file  . Highest education level: Not on file  Occupational History  . 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 Financial resource strain: Not on file  . Food insecurity    Worry: Not on file    Inability: Not on file  . Transportation needs    Medical: Not on file    Non-medical: Not on file  Tobacco Use  . Smoking status: Never Smoker  . Smokeless tobacco: Never Used  Substance and Sexual Activity  . Alcohol use: Yes    Alcohol/week: 0.0 standard drinks    Comment: 2/week  . Drug use: No  . Sexual activity: Not Currently    Partners: Male  Lifestyle  . Physical activity    Days per week: Not on file    Minutes per session: Not on file  . Stress: Not on file  Relationships  . Social Herbalist on phone: Not on file    Gets together: Not on file    Attends religious service: Not on file    Active member of club or organization: Not on file    Attends meetings of clubs or organizations: Not on file    Relationship status: Not on file  . Intimate partner violence    Fear of current or ex partner: Not on file    Emotionally abused: Not on file    Physically abused: Not on file    Forced sexual activity: Not on file  Other Topics  Concern  . Not on file  Social History Narrative   Lives alone. Husband moved to [facility] in [place] (memory care), after having been at [facility] (just under a year).  Son is in [place] (with 2 grandchildren there), daughter is local  Grandson (and great-granddaughter and great grandson (born 10/2018) live in [place].       Family History  Problem Relation Age of Onset  . Allergies Mother   . Hypertension Mother   . Vision loss Mother   . Heart disease Mother   . Arthritis Mother   . Arthritis Father   . Heart disease Father 59  . Hearing loss Sister   . Hypertension Sister   . Hyperlipidemia Sister   . Arthritis Sister   . Heart disease Sister 17       massive MI  . Arthritis Sister   . Heart disease Sister   . Hyperlipidemia Sister   .  Hypertension Sister   . Arthritis Sister   . Hypertension Sister   . Hyperlipidemia Sister   . Thyroid disease Sister   . Diabetes Sister 41       diet controlled  . Vision loss Sister        macular degeneration  . Stroke Maternal Grandmother   . Heart disease Maternal Grandfather   . Stroke Maternal Grandfather   . Heart disease Paternal Grandmother   . Stroke Paternal Grandmother   . Cancer Neg Hx     Outpatient Encounter Medications as of 05/24/2019  Medication Sig Note  . aspirin 81 MG tablet Take 81 mg by mouth daily.     Marland Kitchen atorvastatin (LIPITOR) 20 MG tablet TAKE 1 TABLET BY MOUTH  DAILY   . Calcium Carbonate-Vitamin D (CALTRATE 600+D PO) Take 1 tablet by mouth 2 (two) times daily.     . cholecalciferol (VITAMIN D) 1000 units tablet Take 1,000 Units by mouth daily.   Marland Kitchen escitalopram (LEXAPRO) 10 MG tablet TAKE 1 TABLET BY MOUTH  DAILY   . irbesartan (AVAPRO) 300 MG tablet TAKE 1 TABLET BY MOUTH  DAILY   . Multiple Vitamins-Minerals (CENTRUM SILVER PO) Take 1 tablet by mouth daily.     . QUEtiapine (SEROQUEL) 25 MG tablet TAKE 1 TABLET BY MOUTH AT  BEDTIME   . SYNTHROID 50 MCG tablet TAKE 1 TABLET BY MOUTH  DAILY  BEFORE BREAKFAST   . ALPRAZolam (XANAX) 0.25 MG tablet TAKE 1 TABLET BY MOUTH 3 TIMES A DAY AS NEEDED FOR ANXIETY (Patient not taking: Reported on 11/16/2018)   . cetirizine (ZYRTEC) 10 MG tablet Take 10 mg by mouth daily.   . fluocinonide (LIDEX) 0.05 % gel Apply 1 application topically 4 (four) times daily. Reported on 12/12/2015 05/12/2017: Uses prn for canker sores; much better since changer her drinking water   No facility-administered encounter medications on file as of 05/24/2019.     Allergies  Allergen Reactions  . Amoxicillin Rash  . Erythromycin Rash  . Sulfa Antibiotics Rash   ROS: The patient denies anorexia, fever, weight changes, headaches, vision changes, ear pain, sore throat, breast concerns, chest pain, palpitations, dizziness, syncope, dyspnea on exertion, cough, swelling, nausea, vomiting, diarrhea, constipation, abdominal pain, melena, hematochezia, indigestion/heartburn, hematuria, dysuria, vaginal bleeding, discharge, odor or itch, genital lesions, numbness, tingling, weakness, tremor, suspicious skin lesions, depression, anxiety, abnormal bleeding/bruising, or enlarged lymph nodes. Neck and back pain improved Slight hearing loss and tinnitus, stable (more aware of it in right ear). In the process of getting hearing aids. Slight leakage of urine, urinary frequency, unchanged. Moods are good. Denies any change to fatty growth on the back of her neck.   PHYSICAL EXAM:  BP 128/80   Pulse 64   Temp 97.8 F (36.6 C)   Ht 5' 2.25" (1.581 m)   Wt 146 lb 12.8 oz (66.6 kg)   BMI 26.63 kg/m   Wt Readings from Last 3 Encounters:  05/24/19 146 lb 12.8 oz (66.6 kg)  11/16/18 144 lb (65.3 kg)  09/28/18 144 lb (65.3 kg)    General Appearance:  Alert, cooperative, no distress, appears stated age.  Head:  Normocephalic, without obvious abnormality, atraumatic   Eyes:  PERRL, conjunctiva/corneas clear, EOM's intact, fundi benign   Ears:  Normal TM's and external ear  canals   Nose:  Not examined, wearing mask due to COVID-19 pandemic  Throat:  Not examined, wearing mask due to COVID-19 pandemic  Neck:  Supple, no lymphadenopathy; thyroid:  no enlargement/tenderness/nodules; no carotid bruit or JVD. c-spine nontender. There is a 3.5 x 2.5 cm soft tissue mass at posterior neck (this measurement is when head is upright--with forward flexion expands to 4.5 x 3.5cm.  Appears fairly central, slightly more to the right-- Pt states unchanged x years. Nontender  Back:  Spine nontender, no curvature, ROM normal, no CVA tenderness   Lungs:  Clear to auscultation bilaterally without wheezes, rales or ronchi; respirations unlabored   Chest Wall:  No tenderness or deformity   Heart:  Regular rate and rhythm, S1 and S2 normal, no murmur, rub or gallop   Breast Exam:  No tenderness, masses, or nipple discharge or inversion. No axillary lymphadenopathy   Abdomen:  Soft, non-tender, nondistended, normoactive bowel sounds, no masses, no hepatosplenomegaly   Genitalia:  Normal external genitalia without lesions. Mild atrophic changes. BUS and vagina normal; no cervical motion tenderness. No abnormal vaginal discharge. Uterus and adnexa not enlarged, nontender, no masses.Pap not performed   Rectal:  Normal tone, no masses or tenderness; guaiac negative stool   Extremities:  No clubbing, cyanosis or edema.   Pulses:  2+ and symmetric all extremities   Skin:  Skin color, texture, turgor normal, no rashes or lesions. Many scattered SK's and angiomas  Lymph nodes:  Cervical, supraclavicular, and axillary nodes normal   Neurologic:  Normal strength, sensation and gait; reflexes 2-3+ and symmetric throughout   Psych: Normal mood, affect, hygiene and grooming     Chemistry      Component Value Date/Time   NA 140 05/22/2019 0857   K 4.2 05/22/2019 0857   CL 102 05/22/2019 0857   CO2 22 05/22/2019 0857   BUN 7 (L) 05/22/2019 0857    CREATININE 0.68 05/22/2019 0857   CREATININE 0.66 05/05/2017 0745      Component Value Date/Time   CALCIUM 10.0 05/22/2019 0857   ALKPHOS 65 05/22/2019 0857   AST 22 05/22/2019 0857   ALT 22 05/22/2019 0857   BILITOT 0.4 05/22/2019 0857     Fasting glucose 94  Lab Results  Component Value Date   WBC 4.3 05/22/2019   HGB 13.2 05/22/2019   HCT 39.6 05/22/2019   MCV 93 05/22/2019   PLT 369 05/22/2019   Lab Results  Component Value Date   CHOL 151 05/22/2019   HDL 61 05/22/2019   LDLCALC 75 05/22/2019   TRIG 80 05/22/2019   CHOLHDL 2.5 05/22/2019   Lab Results  Component Value Date   TSH 3.000 05/22/2019    ASSESSMENT/PLAN:  Annual physical exam  Medicare annual wellness visit, subsequent  Pure hypercholesterolemia - at goal on current regimen, continue  Essential hypertension, benign - controlled  Hypothyroidism, unspecified type - adequately replaced  Generalized anxiety disorder - well controlled  Osteopenia, unspecified location - due for repeat DEXA. Pt to call and schedule DEXA and mammo (gets at [facility] due to insurance)  Insomnia, unspecified type - controlled - Plan: QUEtiapine (SEROQUEL) 25 MG tablet   Discussed pneumovax booster (she declined last year; discussed guidelines, not truly needed, but recommended)--will check insurance coverage and schedule NV (vs at next visit)  DEXA and mammo due (gets at [facility])--to call and schedule (didn't get notice, wasn't aware she was due)--hopefully her calling to schedule will result in them faxing [place] order for study.  Discussed monthly self breast exams and yearly mammograms (pt prefers to do q2 years); at least 30 minutes of aerobic activity at least 5 days/week and weight-bearing exercise 2x/week; proper sunscreen  use reviewed; healthy diet, including goals of calcium and vitamin D intake and alcohol recommendations (less than or equal to 1 drink/day) reviewed; regular seatbelt use; changing batteries in smoke  detectors. Immunization recommendations discussed- Pneumovax offered/declined. Colonoscopy recommendations reviewed--she refuses to have another colonoscopy.Negative Cologuard 05/2017, due gain 2021.  Pt is full code, full care. MOST formreviewed and updated  F/u 6 months med check CPE 1 year with labs prior--c-met, cbc, lipid,TSH    Medicare Attestation I have personally reviewed: The patient's medical and social history Their use of alcohol, tobacco or illicit drugs Their current medications and supplements The patient's functional ability including ADLs,fall risks, home safety risks, cognitive, and hearing and visual impairment Diet and physical activities Evidence for depression or mood disorders  The patient's weight, height, BMI, and visual acuity have been recorded in the chart.  I have made referrals, counseling, and provided education to the patient based on review of the above and I have provided the patient with a written personalized care plan for preventive services.

## 2019-05-23 NOTE — Patient Instructions (Addendum)
HEALTH MAINTENANCE RECOMMENDATIONS:  It is recommended that you get at least 30 minutes of aerobic exercise at least 5 days/week (for weight loss, you may need as much as 60-90 minutes). This can be any activity that gets your heart rate up. This can be divided in 10-15 minute intervals if needed, but try and build up your endurance at least once a week.  Weight bearing exercise is also recommended twice weekly.  Eat a healthy diet with lots of vegetables, fruits and fiber.  "Colorful" foods have a lot of vitamins (ie green vegetables, tomatoes, red peppers, etc).  Limit sweet tea, regular sodas and alcoholic beverages, all of which has a lot of calories and sugar.  Up to 1 alcoholic drink daily may be beneficial for women (unless trying to lose weight, watch sugars).  Drink a lot of water.  Calcium recommendations are 1200-1500 mg daily (1500 mg for postmenopausal women or women without ovaries), and vitamin D 1000 IU daily.  This should be obtained from diet and/or supplements (vitamins), and calcium should not be taken all at once, but in divided doses.  Monthly self breast exams and yearly mammograms for women over the age of 37 is recommended.  Sunscreen of at least SPF 30 should be used on all sun-exposed parts of the skin when outside between the hours of 10 am and 4 pm (not just when at beach or pool, but even with exercise, golf, tennis, and yard work!)  Use a sunscreen that says "broad spectrum" so it covers both UVA and UVB rays, and make sure to reapply every 1-2 hours.  Remember to change the batteries in your smoke detectors when changing your clock times in the spring and fall. Carbon monoxide detectors are recommended for your home.  Use your seat belt every time you are in a car, and please drive safely and not be distracted with cell phones and texting while driving.   Lisa [name] , Thank you for taking time to come for your Medicare Wellness Visit. I appreciate your ongoing  commitment to your health goals. Please review the following plan we discussed and let me know if I can assist you in the future.   This is a list of the screening recommended for you and due dates:  Health Maintenance  Topic Date Due  . Tetanus Vaccine  01/12/2022  . Flu Shot  Completed  . DEXA scan (bone density measurement)  Completed  . Pneumonia vaccines  Completed   Pneumonia vaccine (pneumovax-23) booster is recommended (last pneumovax was in 2007; you had prevnar-13 five years ago).  You can schedule a nurse visit to get this at your convenience if you prefer to double check the coverage with your insurance.  Bone density and mammogram are now due (2 year follow-up on thinning of bones/osteopenia; mammograms are every 1-2 years). Please call [facility] to set them up, and have them fax me an order to sign.  Cologuard should be repeated next year (every 3 years).     Chemistry      Component Value Date/Time   NA 140 05/22/2019 0857   K 4.2 05/22/2019 0857   CL 102 05/22/2019 0857   CO2 22 05/22/2019 0857   BUN 7 (L) 05/22/2019 0857   CREATININE 0.68 05/22/2019 0857   CREATININE 0.66 05/05/2017 0745      Component Value Date/Time   CALCIUM 10.0 05/22/2019 0857   ALKPHOS 65 05/22/2019 0857   AST 22 05/22/2019 0857   ALT 22  05/22/2019 0857   BILITOT 0.4 05/22/2019 0857     Fasting glucose 94  Lab Results  Component Value Date   WBC 4.3 05/22/2019   HGB 13.2 05/22/2019   HCT 39.6 05/22/2019   MCV 93 05/22/2019   PLT 369 05/22/2019   Lab Results  Component Value Date   CHOL 151 05/22/2019   HDL 61 05/22/2019   LDLCALC 75 05/22/2019   TRIG 80 05/22/2019   CHOLHDL 2.5 05/22/2019   Lab Results  Component Value Date   TSH 3.000 05/22/2019

## 2019-05-24 ENCOUNTER — Encounter: Payer: Self-pay | Admitting: Family Medicine

## 2019-05-24 ENCOUNTER — Ambulatory Visit (INDEPENDENT_AMBULATORY_CARE_PROVIDER_SITE_OTHER): Payer: Medicare Other | Admitting: Family Medicine

## 2019-05-24 ENCOUNTER — Other Ambulatory Visit: Payer: Self-pay

## 2019-05-24 VITALS — BP 128/80 | HR 64 | Temp 97.8°F | Ht 62.25 in | Wt 146.8 lb

## 2019-05-24 DIAGNOSIS — I10 Essential (primary) hypertension: Secondary | ICD-10-CM

## 2019-05-24 DIAGNOSIS — Z0000 Encounter for general adult medical examination without abnormal findings: Secondary | ICD-10-CM | POA: Diagnosis not present

## 2019-05-24 DIAGNOSIS — G4700 Insomnia, unspecified: Secondary | ICD-10-CM

## 2019-05-24 DIAGNOSIS — E7800 Pure hypercholesterolemia, unspecified: Secondary | ICD-10-CM

## 2019-05-24 DIAGNOSIS — F411 Generalized anxiety disorder: Secondary | ICD-10-CM | POA: Diagnosis not present

## 2019-05-24 DIAGNOSIS — M8580 Other specified disorders of bone density and structure, unspecified site: Secondary | ICD-10-CM

## 2019-05-24 DIAGNOSIS — Z5181 Encounter for therapeutic drug level monitoring: Secondary | ICD-10-CM

## 2019-05-24 DIAGNOSIS — E039 Hypothyroidism, unspecified: Secondary | ICD-10-CM

## 2019-05-24 MED ORDER — QUETIAPINE FUMARATE 25 MG PO TABS: 25.0000 mg | ORAL_TABLET | Freq: Every day | ORAL | 3 refills | Status: DC

## 2019-06-06 ENCOUNTER — Encounter: Payer: Self-pay | Admitting: Family Medicine

## 2019-06-11 ENCOUNTER — Other Ambulatory Visit: Payer: Self-pay | Admitting: Family Medicine

## 2019-06-11 DIAGNOSIS — E039 Hypothyroidism, unspecified: Secondary | ICD-10-CM

## 2019-06-11 MED ORDER — SYNTHROID 50 MCG PO TABS: 50.0000 ug | ORAL_TABLET | Freq: Every day | ORAL | 1 refills | Status: DC

## 2019-06-12 IMAGING — DX DG KNEE COMPLETE 4+V*L*
4 series · 4 of 4 positions shown · non-contrast
Comparison: None.

CLINICAL DATA: Left knee pain for 2 weeks.  No recent injury.

EXAM:
LEFT KNEE - COMPLETE 4+ VIEW

[knee ap]
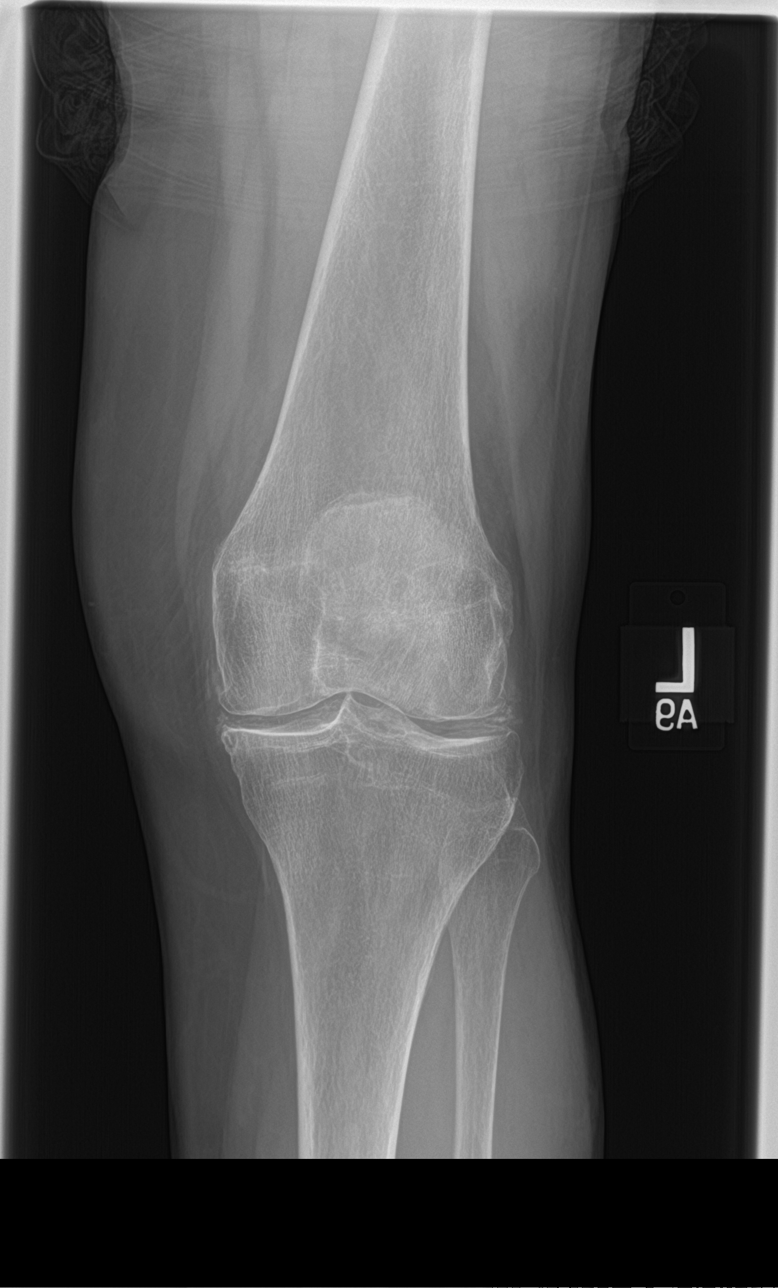

[knee tunnel]
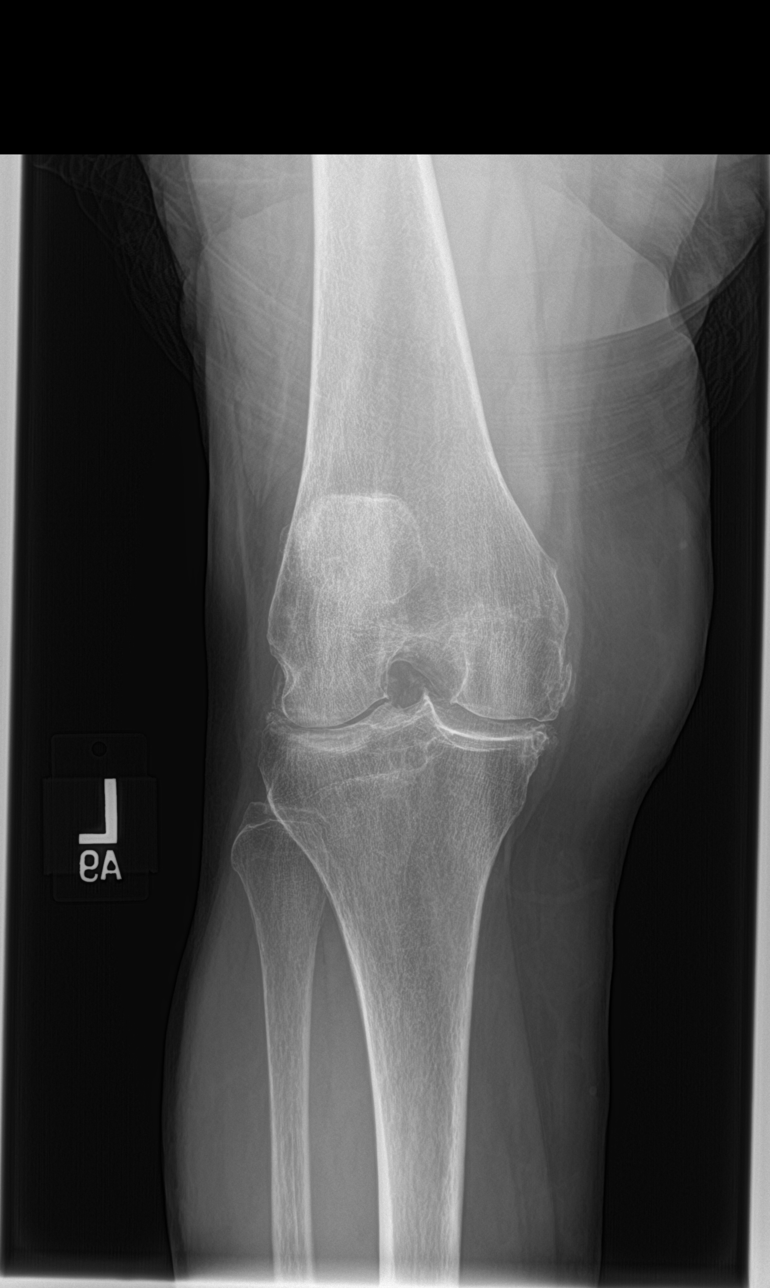

[knee lat]
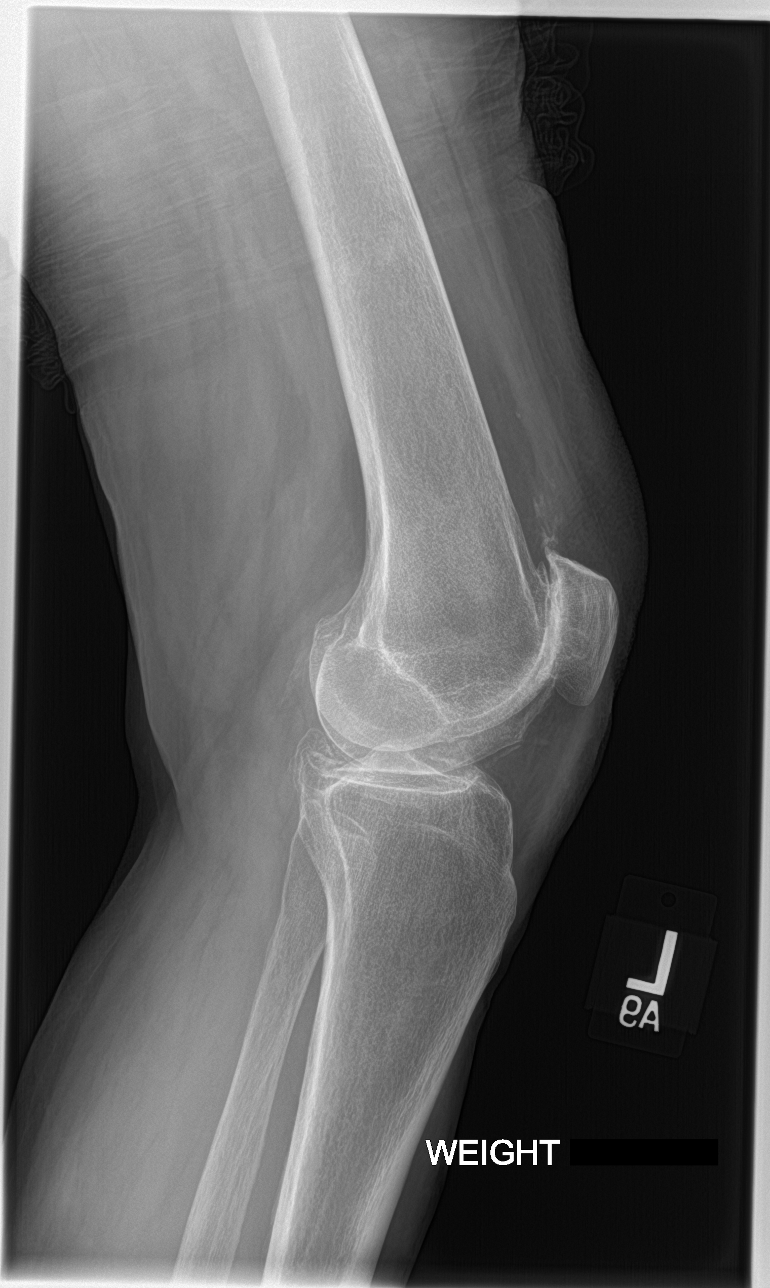

[sunrise]
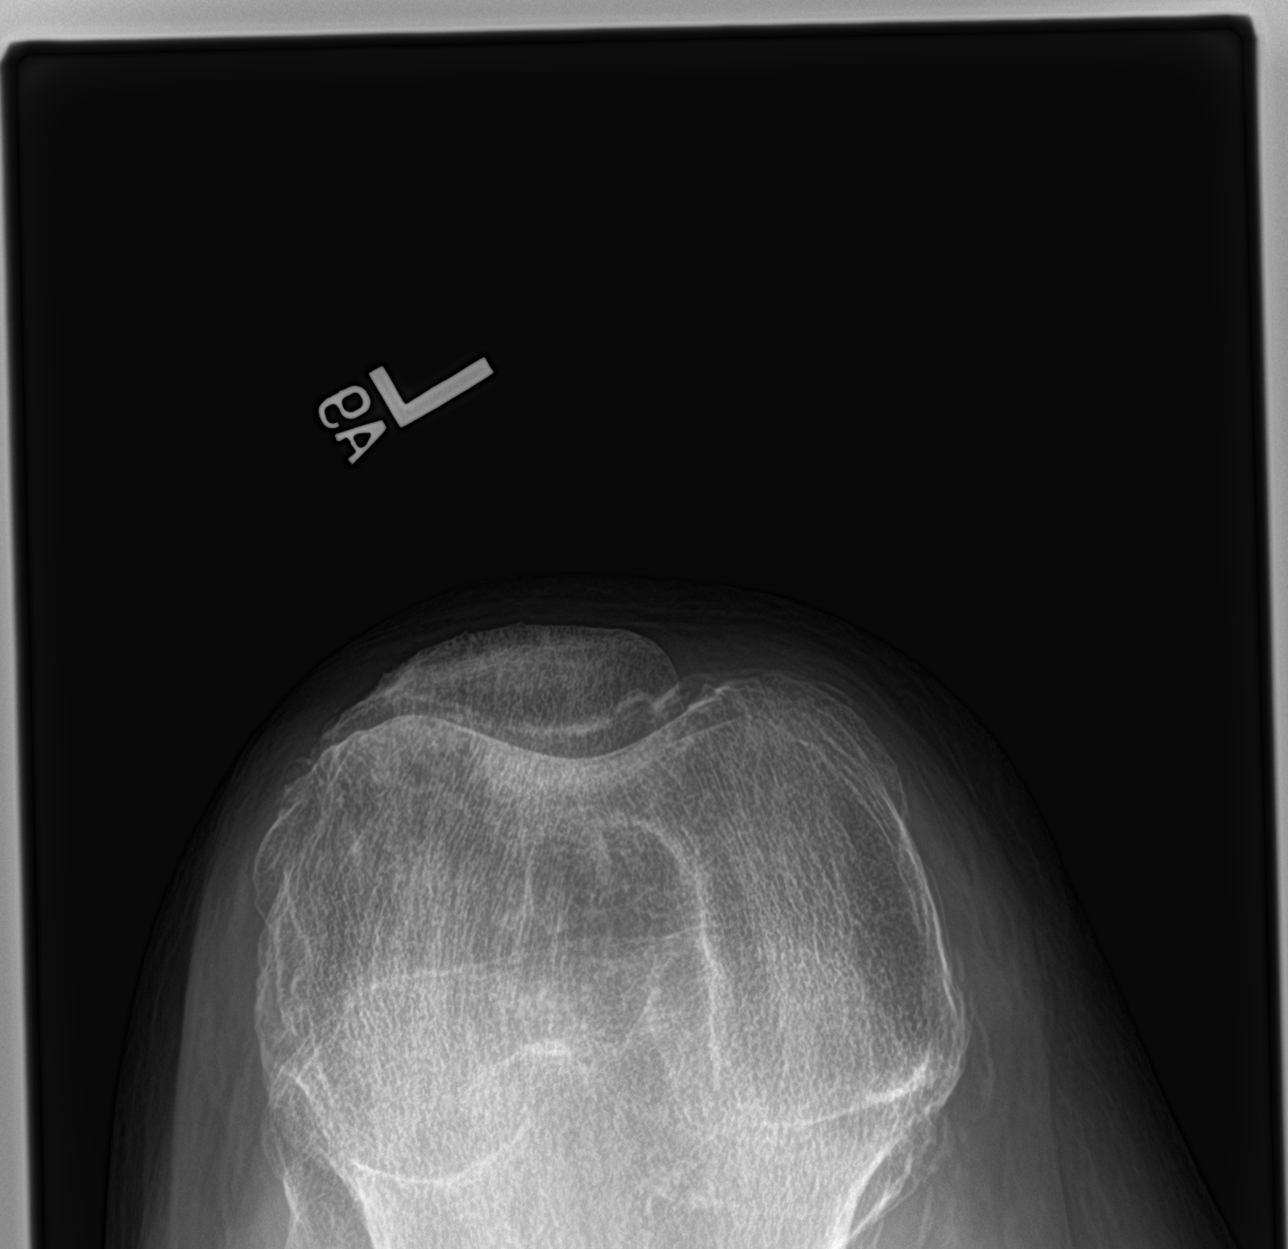

[4 of 4 positions shown; findings below may reference images not displayed]

FINDINGS: No fracture line or displaced fracture fragment. No acute or
suspicious osseous lesion.

Tricompartmental joint space narrowing. Calcifications throughout
the joint spaces, most likely chondrocalcinosis, suggesting
underlying CPPD. Mild associated osseous spurring. No appreciable
joint effusion. Superficial soft tissues are unremarkable.
IMPRESSION: 1. Tricompartmental degenerative joint space narrowing, at least
moderate in degree throughout, moderate to severe at the
patellofemoral compartment. Extensive chondrocalcinosis suggesting
underlying CPPD. Associated mild osseous spurring.
2. No acute findings.

## 2019-06-14 DIAGNOSIS — H52203 Unspecified astigmatism, bilateral: Secondary | ICD-10-CM | POA: Diagnosis not present

## 2019-06-14 DIAGNOSIS — Z961 Presence of intraocular lens: Secondary | ICD-10-CM | POA: Diagnosis not present

## 2019-06-14 DIAGNOSIS — H524 Presbyopia: Secondary | ICD-10-CM | POA: Diagnosis not present

## 2019-06-18 ENCOUNTER — Other Ambulatory Visit: Payer: Self-pay | Admitting: Family Medicine

## 2019-06-18 DIAGNOSIS — E7800 Pure hypercholesterolemia, unspecified: Secondary | ICD-10-CM

## 2019-06-18 DIAGNOSIS — I10 Essential (primary) hypertension: Secondary | ICD-10-CM

## 2019-07-10 ENCOUNTER — Other Ambulatory Visit: Payer: Self-pay | Admitting: Family Medicine

## 2019-07-10 DIAGNOSIS — F411 Generalized anxiety disorder: Secondary | ICD-10-CM

## 2019-07-27 DIAGNOSIS — M8580 Other specified disorders of bone density and structure, unspecified site: Secondary | ICD-10-CM | POA: Diagnosis not present

## 2019-07-27 DIAGNOSIS — M85852 Other specified disorders of bone density and structure, left thigh: Secondary | ICD-10-CM | POA: Diagnosis not present

## 2019-07-27 DIAGNOSIS — Z780 Asymptomatic menopausal state: Secondary | ICD-10-CM | POA: Diagnosis not present

## 2019-07-27 DIAGNOSIS — Z1231 Encounter for screening mammogram for malignant neoplasm of breast: Secondary | ICD-10-CM | POA: Diagnosis not present

## 2019-07-27 DIAGNOSIS — Z13820 Encounter for screening for osteoporosis: Secondary | ICD-10-CM | POA: Diagnosis not present

## 2019-07-27 LAB — HM MAMMOGRAPHY

## 2019-07-27 LAB — HM DEXA SCAN

## 2019-07-30 ENCOUNTER — Encounter: Payer: Self-pay | Admitting: *Deleted

## 2019-08-01 ENCOUNTER — Encounter: Payer: Self-pay | Admitting: Family Medicine

## 2019-09-07 ENCOUNTER — Other Ambulatory Visit: Payer: Self-pay | Admitting: Family Medicine

## 2019-09-07 DIAGNOSIS — E7800 Pure hypercholesterolemia, unspecified: Secondary | ICD-10-CM

## 2019-09-07 DIAGNOSIS — I10 Essential (primary) hypertension: Secondary | ICD-10-CM

## 2019-09-29 ENCOUNTER — Other Ambulatory Visit: Payer: Self-pay | Admitting: Family Medicine

## 2019-09-29 DIAGNOSIS — F411 Generalized anxiety disorder: Secondary | ICD-10-CM

## 2019-10-29 ENCOUNTER — Other Ambulatory Visit: Payer: Self-pay | Admitting: Family Medicine

## 2019-10-29 DIAGNOSIS — E039 Hypothyroidism, unspecified: Secondary | ICD-10-CM

## 2019-11-20 ENCOUNTER — Telehealth: Payer: Self-pay | Admitting: Family Medicine

## 2019-11-20 NOTE — Telephone Encounter (Signed)
A representative from [employer] dropped off a medical clearance form. Rep states please fax back to number on information. Sending back in folder.

## 2019-11-27 ENCOUNTER — Other Ambulatory Visit: Payer: Self-pay | Admitting: Family Medicine

## 2019-11-27 DIAGNOSIS — E7800 Pure hypercholesterolemia, unspecified: Secondary | ICD-10-CM

## 2019-11-27 DIAGNOSIS — I10 Essential (primary) hypertension: Secondary | ICD-10-CM

## 2019-11-28 ENCOUNTER — Encounter: Payer: Medicare Other | Admitting: Family Medicine

## 2019-11-28 NOTE — Telephone Encounter (Signed)
These refills came in today. Patient was scheduled for today and called me Monday to see if I thought it was okay to cancel as her husband is in pallative care now. I told her I didn't think it was a good idea as she needed her 6 month follow up and might be good to come and chat with you in general.  She agreed and moved appointment to tomorrow and now looks liked she called back yesterday to cancel. Has appt scheduled for Nov for CPE. How would you like to handle?

## 2019-11-28 NOTE — Telephone Encounter (Signed)
All of her medications will be due for refill in the next month (she should have enough through 5/1 for these two, lexapro will be due later in the month).  She does need to come in for a med check. I'd prefer her to come in before 5/1; if unable, then we can give 90d (I won't change to 30d), but NO REFILLS--currently requesting 1 year supply, which isn't appropriate)

## 2019-11-28 NOTE — Telephone Encounter (Signed)
Left message asking patient to call me back

## 2019-11-29 ENCOUNTER — Encounter: Payer: Self-pay | Admitting: Family Medicine

## 2019-11-29 ENCOUNTER — Encounter: Payer: Medicare Other | Admitting: Family Medicine

## 2019-11-29 NOTE — Telephone Encounter (Signed)
I did get in touch with patient, Lisa [name] passed away 2022/12/07 night. His burial is actually tomorrow. She said she doesn't need these at the moment and will call once she gets herself "settled," and she will make an appointment.

## 2019-12-11 NOTE — Progress Notes (Signed)
Chief Complaint  Patient presents with  . Hypertension    nonfasting med check. No concerns.     Patient presents for follow-up on chronic problems. Her husband Ree Kida passed away last month.  He had been in memory care.  Anxiety: 3 weeks ago she started tapering the medication to every other day, and this past week she stopped it entirely. So far she is feeling good. Anxiety had been much better since her husband left [facility], and she continues to do well since his passing.  Denies depression.  She was happy with the green burial and how everything was handled.  She is looking forward to going to the beach with her family next week.  Hypertension follow-up: She is compliant with taking her Avapro 300mg daily. Deniesheadaches, dizziness, chest pain, palpitations. Denies edema; denies side effects.  BP's are running 130-136/68-82, checked just a few times since April (otherwise not checked since 01/2019).    Hyperlipidemia follow-up: Patient is reportedly following a low-fat, low cholesterol diet. Compliant with medications(atorvastatin 20mg )and denies medication side effects. Lipids were at goal on last check. Lab Results  Component Value Date   CHOL 151 05/22/2019   HDL 61 05/22/2019   LDLCALC 75 05/22/2019   TRIG 80 05/22/2019   CHOLHDL 2.5 05/22/2019   Hypothyroidism: Thyroid biopsy in 2011 was benign; had seen Dr. 05/24/2019 in the past, but we are now managing her medications. Denies fatigue, hair, skin, bowel changes. Denies any mood changes. Denies dysphagia or any thyroid concerns. Takes Synthroid on empty stomach, separate from food and other medications. She was adequately replaced on last check. Lab Results  Component Value Date   TSH 3.000 05/22/2019   Insomnia: Well controlled with Seroquel. Some early morning wakening since her husband died.  She has seenDr. Talmage Nap for joint pains (neck, back), which had been helpful.  She does her home exercises.She had  been getting monthly massages for herself,which helps. She hasn't had massages since COVID, and back/neck is still okay, but she would like to resume massages, as they are enjoyable.  Osteopenia: Takes Calcium with D BID, gets regular exercise, including weights (with both the DVD and silver sneaker classes) She reports previously taking Actonel for 10 years, stopped it on her own, concerned about risks. She had repeat DEXA in 07/2019 which showed stable osteopenia.  Seasonal allergies, sometimes triggers mild vertigo.She is taking cetirizine 10mg  daily. Some slight vertigo over the last 2 weeks.  She can focus on something until it resolves and she is okay.  PMH, PSH, SH reviewed  Outpatient Encounter Medications as of 12/13/2019  Medication Sig Note  . aspirin 81 MG tablet Take 81 mg by mouth daily.     08/2019 atorvastatin (LIPITOR) 20 MG tablet TAKE 1 TABLET BY MOUTH  DAILY   . Calcium Carbonate-Vitamin D (CALTRATE 600+D PO) Take 1 tablet by mouth 2 (two) times daily.     . cetirizine (ZYRTEC) 10 MG tablet Take 10 mg by mouth daily.   . cholecalciferol (VITAMIN D) 1000 units tablet Take 1,000 Units by mouth daily.   . irbesartan (AVAPRO) 300 MG tablet TAKE 1 TABLET BY MOUTH  DAILY   . Multiple Vitamins-Minerals (CENTRUM SILVER PO) Take 1 tablet by mouth daily.     . QUEtiapine (SEROQUEL) 25 MG tablet Take 1 tablet (25 mg total) by mouth at bedtime.   SYNTHROID 50 MCG tablet TAKE 1 TABLET BY MOUTH  DAILY BEFORE BREAKFAST   . ALPRAZolam (XANAX) 0.25 MG tablet  TAKE 1 TABLET BY MOUTH 3 TIMES A DAY AS NEEDED FOR ANXIETY (Patient not taking: Reported on 11/16/2018)   . escitalopram (LEXAPRO) 10 MG tablet TAKE 1 TABLET BY MOUTH  DAILY (Patient not taking: Reported on 12/13/2019) 12/13/2019: Weaned off  . fluocinonide (LIDEX) 0.05 % gel Apply 1 application topically 4 (four) times daily. Reported on 12/12/2015 05/12/2017: Uses prn for canker sores; much better since changer her drinking water   No  facility-administered encounter medications on file as of 12/13/2019.   Allergies  Allergen Reactions  . Amoxicillin Rash  . Erythromycin Rash  . Sulfa Antibiotics Rash   ROS: No fever, chills, URI symptoms, shortness of breath, chest pain, GI complaints, GU complaints. Some mild allergies with occasional mild vertigo per HPI.  No joint pain, bleeding, bruising, rashes or other complaints.  See HPI.   PHYSICAL EXAM:  BP 128/74   Pulse 72   Temp (!) 97.1 F (36.2 C) (Other (Comment))   Ht 5' 2.25" (1.581 m)   Wt 144 lb 12.8 oz (65.7 kg)   BMI 26.27 kg/m   Pleasant, well-appearing female, in good spirits, in no distress HEENT: conjunctiva and sclera are clear, EOMI. Wearing mask Neck: no lymphadenopathy or mass, no carotid bruit Heart: regular rate and rhythm Lungs: clear bilaterally Back: no spinal or CVA tenderness Abdomen: soft, nontender, no mass Extremities: no edema Psych: normal mood, affect, hygiene and grooming, eye contact and speech Neuro: alert and oriented, normal gait.   ASSESSMENT/PLAN:  Essential hypertension, benign - well controlled on current regimen, cont avapro  Hypothyroidism, unspecified type - euthyroid by history, last TSH normal, recheck in 6 mos  Pure hypercholesterolemia - cont statin; lipids at goal on last check. Recheck in 6 mos  Generalized anxiety disorder - she recently tapered meds off on her own. Discussed s/sx poss recurring over 2 mos, and to restart at 1/2 tablet if recurs  Osteopenia, unspecified location - last DEXA reviewed with pt. Cont Ca, D, weight-bearing exercise  Insomnia, unspecified type - controlled with seroquel   She states she has unopened bottles of meds, they sent refills very early and often. No refills needed.  No labs needed Has lab orders for 05/2020--pt will schedule lab visit for prior to her November CPE

## 2019-12-13 ENCOUNTER — Other Ambulatory Visit: Payer: Self-pay

## 2019-12-13 ENCOUNTER — Ambulatory Visit (INDEPENDENT_AMBULATORY_CARE_PROVIDER_SITE_OTHER): Payer: Medicare Other | Admitting: Family Medicine

## 2019-12-13 ENCOUNTER — Other Ambulatory Visit: Payer: Self-pay | Admitting: Family Medicine

## 2019-12-13 ENCOUNTER — Encounter: Payer: Self-pay | Admitting: Family Medicine

## 2019-12-13 VITALS — BP 128/74 | HR 72 | Temp 97.1°F | Ht 62.25 in | Wt 144.8 lb

## 2019-12-13 DIAGNOSIS — F411 Generalized anxiety disorder: Secondary | ICD-10-CM | POA: Diagnosis not present

## 2019-12-13 DIAGNOSIS — E039 Hypothyroidism, unspecified: Secondary | ICD-10-CM | POA: Diagnosis not present

## 2019-12-13 DIAGNOSIS — I10 Essential (primary) hypertension: Secondary | ICD-10-CM

## 2019-12-13 DIAGNOSIS — M8580 Other specified disorders of bone density and structure, unspecified site: Secondary | ICD-10-CM | POA: Diagnosis not present

## 2019-12-13 DIAGNOSIS — E7800 Pure hypercholesterolemia, unspecified: Secondary | ICD-10-CM

## 2019-12-13 DIAGNOSIS — G4700 Insomnia, unspecified: Secondary | ICD-10-CM

## 2019-12-13 NOTE — Patient Instructions (Addendum)
Okay to stay off the lexapro at this point, since you have already tapered off. You may notice recurrent anxiety in the next month or two, if so, restart the lexapro at 1/2 tablet, and you might actually do well on the lower dose, and not need to go back up to the full pill. Discuss with your family, as they may see the signs/symptoms of anxiety recurring before you do.  Continue your current medications, and contact the pharmacy and/or [place] when you actually are in need of refills.

## 2019-12-30 ENCOUNTER — Other Ambulatory Visit: Payer: Self-pay | Admitting: Family Medicine

## 2019-12-30 DIAGNOSIS — F411 Generalized anxiety disorder: Secondary | ICD-10-CM

## 2020-02-08 ENCOUNTER — Other Ambulatory Visit: Payer: Self-pay | Admitting: Family Medicine

## 2020-02-08 DIAGNOSIS — G4700 Insomnia, unspecified: Secondary | ICD-10-CM

## 2020-02-08 DIAGNOSIS — E039 Hypothyroidism, unspecified: Secondary | ICD-10-CM

## 2020-02-08 DIAGNOSIS — I10 Essential (primary) hypertension: Secondary | ICD-10-CM

## 2020-02-08 DIAGNOSIS — F411 Generalized anxiety disorder: Secondary | ICD-10-CM

## 2020-02-08 DIAGNOSIS — E7800 Pure hypercholesterolemia, unspecified: Secondary | ICD-10-CM

## 2020-02-08 NOTE — Telephone Encounter (Signed)
Left message for pt to call me back  Per last visit. Lexapro was to be tapered. Does she need this still.

## 2020-02-12 NOTE — Telephone Encounter (Signed)
Left message for pt to call back about lexapro

## 2020-02-13 NOTE — Telephone Encounter (Signed)
Pt states she does not need anything but  the ibresartan refilled. She is not currently even on the lexapro at this time

## 2020-02-13 NOTE — Telephone Encounter (Signed)
Pt has an appt in november 

## 2020-02-14 ENCOUNTER — Other Ambulatory Visit: Payer: Self-pay | Admitting: Family Medicine

## 2020-02-14 DIAGNOSIS — E7800 Pure hypercholesterolemia, unspecified: Secondary | ICD-10-CM

## 2020-02-17 ENCOUNTER — Other Ambulatory Visit: Payer: Self-pay | Admitting: Family Medicine

## 2020-02-17 DIAGNOSIS — E039 Hypothyroidism, unspecified: Secondary | ICD-10-CM

## 2020-02-18 NOTE — Telephone Encounter (Signed)
Last labs 05/2019. Scheduled for 06/2020

## 2020-03-11 ENCOUNTER — Telehealth: Payer: Self-pay

## 2020-03-11 ENCOUNTER — Other Ambulatory Visit: Payer: Self-pay | Admitting: Medical

## 2020-03-11 DIAGNOSIS — G4700 Insomnia, unspecified: Secondary | ICD-10-CM

## 2020-03-11 NOTE — Telephone Encounter (Signed)
Refill for quetipine?

## 2020-03-11 NOTE — Telephone Encounter (Signed)
Received a fax from [facility] for a refill on the pts. Quetiapine 25mg  the pts. Last apt was 12/13/19 and next apt is 06/12/20.

## 2020-03-11 NOTE — Telephone Encounter (Signed)
Last refilled for year supply by Dr. Lynelle Doctor 05/2019.  Thus, it shouldn't need refill.  Please check with [facility]

## 2020-03-11 NOTE — Telephone Encounter (Signed)
Pt got last refill on 01/08/20 for her last 90 day supply. Callled and left message for pt to call back to see if she needs refill still on this

## 2020-03-24 ENCOUNTER — Other Ambulatory Visit: Payer: Self-pay | Admitting: Family Medicine

## 2020-03-24 DIAGNOSIS — G4700 Insomnia, unspecified: Secondary | ICD-10-CM

## 2020-04-22 ENCOUNTER — Telehealth: Payer: Self-pay | Admitting: *Deleted

## 2020-04-22 ENCOUNTER — Other Ambulatory Visit: Payer: Self-pay | Admitting: *Deleted

## 2020-04-22 DIAGNOSIS — G4700 Insomnia, unspecified: Secondary | ICD-10-CM

## 2020-04-22 MED ORDER — QUETIAPINE FUMARATE 25 MG PO TABS: 25.0000 mg | ORAL_TABLET | Freq: Every day | ORAL | 0 refills | Status: DC

## 2020-04-22 NOTE — Telephone Encounter (Signed)
Ok for #90 and 2 weeks loacl

## 2020-04-22 NOTE — Telephone Encounter (Signed)
Patient called and it out of town and will be back in town for only a few days, not long enough to get her mail order rx for seroquel in time. She needs a new rx for mail order as well as a short term rx local, maybe 2 weeks worth sent to [facility], she said she only has 10 pills left.

## 2020-04-22 NOTE — Telephone Encounter (Signed)
Done

## 2020-05-12 ENCOUNTER — Other Ambulatory Visit: Payer: Self-pay | Admitting: Family Medicine

## 2020-05-12 DIAGNOSIS — E7800 Pure hypercholesterolemia, unspecified: Secondary | ICD-10-CM

## 2020-05-15 ENCOUNTER — Other Ambulatory Visit: Payer: Self-pay

## 2020-05-15 ENCOUNTER — Ambulatory Visit (INDEPENDENT_AMBULATORY_CARE_PROVIDER_SITE_OTHER): Payer: Medicare Other

## 2020-05-15 DIAGNOSIS — Z23 Encounter for immunization: Secondary | ICD-10-CM

## 2020-06-09 ENCOUNTER — Other Ambulatory Visit: Payer: Self-pay

## 2020-06-09 ENCOUNTER — Other Ambulatory Visit: Payer: Medicare Other

## 2020-06-09 DIAGNOSIS — E039 Hypothyroidism, unspecified: Secondary | ICD-10-CM | POA: Diagnosis not present

## 2020-06-09 DIAGNOSIS — I10 Essential (primary) hypertension: Secondary | ICD-10-CM | POA: Diagnosis not present

## 2020-06-09 DIAGNOSIS — Z5181 Encounter for therapeutic drug level monitoring: Secondary | ICD-10-CM

## 2020-06-09 DIAGNOSIS — E7800 Pure hypercholesterolemia, unspecified: Secondary | ICD-10-CM | POA: Diagnosis not present

## 2020-06-09 DIAGNOSIS — Z1159 Encounter for screening for other viral diseases: Secondary | ICD-10-CM | POA: Diagnosis not present

## 2020-06-10 LAB — COMPREHENSIVE METABOLIC PANEL
ALT: 17 IU/L (ref 0–32)
AST: 18 IU/L (ref 0–40)
Albumin/Globulin Ratio: 2.1 (ref 1.2–2.2)
Albumin: 4.8 g/dL — ABNORMAL HIGH (ref 3.7–4.7)
Alkaline Phosphatase: 74 IU/L (ref 44–121)
BUN/Creatinine Ratio: 15 (ref 12–28)
BUN: 10 mg/dL (ref 8–27)
Bilirubin Total: 0.4 mg/dL (ref 0.0–1.2)
CO2: 25 mmol/L (ref 20–29)
Calcium: 10.5 mg/dL — ABNORMAL HIGH (ref 8.7–10.3)
Chloride: 102 mmol/L (ref 96–106)
Creatinine, Ser: 0.68 mg/dL (ref 0.57–1.00)
GFR calc Af Amer: 96 mL/min/{1.73_m2} (ref >59)
GFR calc non Af Amer: 83 mL/min/{1.73_m2} (ref >59)
Globulin, Total: 2.3 g/dL (ref 1.5–4.5)
Glucose: 98 mg/dL (ref 65–99)
Potassium: 4.3 mmol/L (ref 3.5–5.2)
Sodium: 140 mmol/L (ref 134–144)
Total Protein: 7.1 g/dL (ref 6.0–8.5)

## 2020-06-10 LAB — CBC WITH DIFFERENTIAL/PLATELET
Basophils Absolute: 0.1 10*3/uL (ref 0.0–0.2)
Basos: 1 %
EOS (ABSOLUTE): 0.3 10*3/uL (ref 0.0–0.4)
Eos: 6 %
Hematocrit: 39.2 % (ref 34.0–46.6)
Hemoglobin: 13.5 g/dL (ref 11.1–15.9)
Immature Grans (Abs): 0 10*3/uL (ref 0.0–0.1)
Immature Granulocytes: 0 %
Lymphocytes Absolute: 1.5 10*3/uL (ref 0.7–3.1)
Lymphs: 35 %
MCH: 31.5 pg (ref 26.6–33.0)
MCHC: 34.4 g/dL (ref 31.5–35.7)
MCV: 91 fL (ref 79–97)
Monocytes Absolute: 0.4 10*3/uL (ref 0.1–0.9)
Monocytes: 10 %
Neutrophils Absolute: 2 10*3/uL (ref 1.4–7.0)
Neutrophils: 48 %
Platelets: 345 10*3/uL (ref 150–450)
RBC: 4.29 x10E6/uL (ref 3.77–5.28)
RDW: 13.1 % (ref 11.7–15.4)
WBC: 4.2 10*3/uL (ref 3.4–10.8)

## 2020-06-10 LAB — LIPID PANEL
Chol/HDL Ratio: 2.9 ratio (ref 0.0–4.4)
Cholesterol, Total: 180 mg/dL (ref 100–199)
HDL: 63 mg/dL (ref >39)
LDL Chol Calc (NIH): 99 mg/dL (ref 0–99)
Triglycerides: 101 mg/dL (ref 0–149)
VLDL Cholesterol Cal: 18 mg/dL (ref 5–40)

## 2020-06-10 LAB — TSH: TSH: 3.43 u[IU]/mL (ref 0.450–4.500)

## 2020-06-11 NOTE — Progress Notes (Signed)
Chief Complaint  Patient presents with  . Medicare Wellness    nonfasting AWV/CPE with pelvic. No new concerns.    . Immunizations    prefers to only get HD Flu today, would like to come back for pneumo at a later date as she has a busy weekend.     Lisa [name] is a 79 y.o. female who presents for annual physical, Medicare wellness visit and follow-up on chronic medical conditions.  She had labs done prior to her visit, see below.  Hypertension follow-up: She is compliant with taking her Avapro 354mdaily. Deniesheadaches, dizziness, chest pain, palpitations. Denies edema; denies side effects.  BP's are running 121-142/71-78, mostly 120-mid 130's/70's.   Hyperlipidemia follow-up: Patient is reportedly following a low-fat, low cholesterol diet. Compliant with medications (atorvastatin 261m and denies medication side effects.   Hypothyroidism: She denies fatigue, hair, skin, bowel changes. Denies any mood changes. Denies dysphagia or any thyroid concerns. Takes Synthroid 50 mcg on empty stomach, separate from food and other medications.   Insomnia: Well controlled with Seroquel. At her visit in May she noted some early morning wakening since her husband passed away.This has resolved, and she is back to sleeping very well.  Anxiety: She tapered off Lexapro 6 months ago, after her husband died.  She denies any depression or significant anxiety. She had a nice family trip to the beach in May. She reports moods are still very good, is very happy.  Prior to the [place], she sawDr. ZaCharlann Boxeror joint pains (neck, back). She would go every few months, which had been helpful. She had been getting monthly massages for herself,which helped, [place] She is no longer getting massages or seeing Dr. SmTamala Julianhe recommended coming after a massage).  She denies any pain in her neck or back.  She liked getting the adjustments and massages as a special treat. She is doing  home exercises.  Osteopenia: Takes Calcium with D BID, gets regular exercise, including weights (with silver sneaker classes)She reports previously taking Actonel for 10 years, stopped it on her own, concerned about risks. She had repeat DEXA in 07/2019 which showed stable osteopenia.  Seasonal allergies: taking OTC Allegra seasonally.  It is worse in the Spring (not as bad since wearing masks).  Uses Allegra prn, didn't take one today, did yesterday. Hasn't had any recurrence of vertigo in quite some time.   Immunization History  Administered Date(s) Administered  . Fluad Quad(high Dose 65+) 05/22/2019, 06/12/2020  . Influenza Whole 05/02/2010  . Influenza, High Dose Seasonal PF 05/16/2014, 04/23/2015, 05/05/2016, 05/12/2017, 05/24/2018  . PFIZER SARS-COV-2 Vaccination 08/20/2019, 09/10/2019, 05/15/2020  . Pneumococcal Conjugate-13 04/17/2014  . Pneumococcal Polysaccharide-23 09/21/2005  . Td 01/02/2002  . Tdap 01/13/2012  . Zoster 02/26/2011  . Zoster Recombinat (Shingrix) 05/20/2018, 08/21/2018   Last Pap smear: 04/2015, normal no high risk HPV Last mammo: 07/2019 Colonoscopy 5/08 (refuses to do again); negative Cologuard 05/2017 Dexa 07/2019 T -2.0 at L fem neck.  Dentist and ophtho regularly  Exercise: JuAlmyra Freeuther's Silver Sneakers 3x/week Vitamin D last checked 04/2015 normal at [id number] doctors caring for patient include: Dentist: Dr. KoNoah Charonphtho: Dr. LiGillian Scarcet [facility] Cardiologist: Dr. KeClaiborne Billingsno longer sees) Endocrinologist: Dr. BaChalmers Caterno longer sees) Sports medicine: Dr. ZaCharlann BoxerDepression screen: negative Fall screen: none Functional status survey notable for hearing loss--has hearing aids. Slight leakage of urine, wears pantiliner Mini-Cog screen: Normal  End of Life Discussion: Patient hasa  living will and medical power of attorney.  Brought in today to be scanned   PMH, PSH, SH and FH were reviewed and updated  Outpatient  Encounter Medications as of 06/12/2020  Medication Sig Note  . aspirin 81 MG tablet Take 81 mg by mouth daily.     Marland Kitchen atorvastatin (LIPITOR) 20 MG tablet TAKE 1 TABLET BY MOUTH  DAILY   . Calcium Carbonate-Vitamin D (CALTRATE 600+D PO) Take 1 tablet by mouth 2 (two) times daily.     . cholecalciferol (VITAMIN D) 1000 units tablet Take 1,000 Units by mouth daily.   . fexofenadine (ALLEGRA) 180 MG tablet Take 180 mg by mouth daily. 06/12/2020: Takes prn allergies; didn't take today  . irbesartan (AVAPRO) 300 MG tablet TAKE 1 TABLET BY MOUTH  DAILY   . Multiple Vitamins-Minerals (CENTRUM SILVER PO) Take 1 tablet by mouth daily.     . QUEtiapine (SEROQUEL) 25 MG tablet Take 1 tablet (25 mg total) by mouth at bedtime.   Marland Kitchen SYNTHROID 50 MCG tablet TAKE 1 TABLET BY MOUTH  DAILY BEFORE BREAKFAST   . ALPRAZolam (XANAX) 0.25 MG tablet TAKE 1 TABLET BY MOUTH 3 TIMES A DAY AS NEEDED FOR ANXIETY (Patient not taking: Reported on 11/16/2018)   . fluocinonide (LIDEX) 0.05 % gel Apply 1 application topically 4 (four) times daily. Reported on 12/12/2015 (Patient not taking: Reported on 06/12/2020) 05/12/2017: Uses prn for canker sores; much better since changer her drinking water  . [DISCONTINUED] cetirizine (ZYRTEC) 10 MG tablet Take 10 mg by mouth daily.   . [DISCONTINUED] escitalopram (LEXAPRO) 10 MG tablet TAKE 1 TABLET BY MOUTH  DAILY (Patient not taking: Reported on 12/13/2019) 12/13/2019: Weaned off   No facility-administered encounter medications on file as of 06/12/2020.   Allergies  Allergen Reactions  . Amoxicillin Rash  . Erythromycin Rash  . Sulfa Antibiotics Rash    ROS: The patient denies anorexia, fever, weight changes, headaches, vision changes, ear pain, sore throat, breast concerns, chest pain, palpitations, dizziness, syncope, dyspnea on exertion, cough, swelling, nausea, vomiting, diarrhea, constipation, abdominal pain, melena, hematochezia, indigestion/heartburn, hematuria, dysuria, vaginal bleeding,  discharge, odor or itch, genital lesions, numbness, tingling, weakness, tremor, suspicious skin lesions, depression, anxiety, abnormal bleeding/bruising, or enlarged lymph nodes. Got hearing aids.  Denies significant tinnitus or vertigo. Slight leakage of urine, urinary frequency, unchanged. Moods are good. Denies any change to fatty growth on the back of her neck.   PHYSICAL EXAM:  BP 122/74   Pulse 72   Ht 5' 1.5" (1.562 m)   Wt 146 lb 6.4 oz (66.4 kg)   BMI 27.21 kg/m   Wt Readings from Last 3 Encounters:  06/12/20 146 lb 6.4 oz (66.4 kg)  12/13/19 144 lb 12.8 oz (65.7 kg)  05/24/19 146 lb 12.8 oz (66.6 kg)    General Appearance:  Alert, cooperative, no distress, appears stated age.  Head:  Normocephalic, without obvious abnormality, atraumatic   Eyes:  PERRL, conjunctiva/corneas clear, EOM's intact, fundi benign   Ears:  Normal TM's and external ear canals   Nose:  Not examined, wearing mask due to COVID-19 pandemic  Throat:  Not examined, wearing mask due to COVID-19 pandemic  Neck:  Supple, no lymphadenopathy; thyroid: no enlargement/tenderness/nodules; no carotid bruit or JVD. c-spine nontender. There is a 3.5-4cm soft tissue mass at posterior neck.  Appears fairly central, slightly more to the right-- Pt states unchanged x years. Nontender. No change from last year  Back:  Spine nontender, no curvature, ROM normal,  no CVA tenderness   Lungs:  Clear to auscultation bilaterally without wheezes, rales or ronchi; respirations unlabored   Chest Wall:  No tenderness or deformity   Heart:  Regular rate and rhythm, S1 and S2 normal, no murmur, rub or gallop   Breast Exam:  No tenderness, masses, or nipple discharge or inversion. No axillary lymphadenopathy   Abdomen:  Soft, non-tender, nondistended, normoactive bowel sounds, no masses, no hepatosplenomegaly   Genitalia:  Normal external genitalia without lesions. Mild atrophic changes. BUS and vagina normal;  no cervical motion tenderness. No abnormal vaginal discharge. Uterus and adnexa not enlarged, nontender, no masses.Pap not performed   Rectal:  Normal tone, no masses or tenderness; guaiac negative stool   Extremities:  No clubbing, cyanosis or edema.   Pulses:  2+ and symmetric all extremities   Skin:  Skin color, texture, turgor normal, no rashes or lesions. Many scattered SK's and angiomas  Lymph nodes: Cervical, supraclavicular, and axillary nodes normal   Neurologic:  Normal strength, sensation and gait; reflexes 2-3+ and symmetric throughout   Psych: Normal mood, affect, hygiene and grooming     Chemistry      Component Value Date/Time   NA 140 06/09/2020 0852   K 4.3 06/09/2020 0852   CL 102 06/09/2020 0852   CO2 25 06/09/2020 0852   BUN 10 06/09/2020 0852   CREATININE 0.68 06/09/2020 0852   CREATININE 0.66 05/05/2017 0745      Component Value Date/Time   CALCIUM 10.5 (H) 06/09/2020 0852   ALKPHOS 74 06/09/2020 0852   AST 18 06/09/2020 0852   ALT 17 06/09/2020 0852   BILITOT 0.4 06/09/2020 0852     Fasting glucose 98  Lab Results  Component Value Date   WBC 4.2 06/09/2020   HGB 13.5 06/09/2020   HCT 39.2 06/09/2020   MCV 91 06/09/2020   PLT 345 06/09/2020   Lab Results  Component Value Date   CHOL 180 06/09/2020   HDL 63 06/09/2020   LDLCALC 99 06/09/2020   TRIG 101 06/09/2020   CHOLHDL 2.9 06/09/2020   Lab Results  Component Value Date   TSH 3.430 06/09/2020    ASSESSMENT/PLAN:  Annual physical exam  Medicare annual wellness visit, subsequent  Essential hypertension, benign - well controlled, continue current meds  Hypothyroidism, unspecified type - adequately replaced.  Changed to Synthroid Direct for cost purposes  Pure hypercholesterolemia - Lipids at goal on statin  Osteopenia of neck of left femur - discussed Ca, D, weight-bearing exercise.  Recheck DEXA 07/2021  Insomnia, unspecified type - well controlled;  recommended trial decrease/tapering seroquel  Generalized anxiety disorder - resolved; doing well off medication  Need for influenza vaccination - Plan: Flu Vaccine QUAD High Dose(Fluad)  Colon cancer screening - counseled in detail re: age, life expectancy, colonoscopy vs Cologuard.  She declines Cologuard at this time   Discussed monthly self breast exams and yearly mammograms (pt prefers to do q2 years); at least 30 minutes of aerobic activity at least 5 days/week and weight-bearing exercise 2x/week; proper sunscreen use reviewed; healthy diet, including goals of calcium and vitamin D intake and alcohol recommendations (less than or equal to 1 drink/day) reviewed; regular seatbelt use; changing batteries in smoke detectors. Immunization recommendations discussed- Pneumovax offered/declined. Agrees to come back for NV (doesn't want same day as flu shot). Colonoscopy recommendations reviewed--she refuses to have another colonoscopy; Negative Cologuard 05/2017--discussed repeating Cologuard now, and she isn't interested in repeatiing this either. Understands potentially later dx of cancer  without screening.  Pt is full code, full care. MOST formreviewed and updated  F/u 6 months med check CPE 1 year with labs prior--c-met, cbc, lipid,TSH. Order labs at her med check.    Medicare Attestation I have personally reviewed: The patient's medical and social history Their use of alcohol, tobacco or illicit drugs Their current medications and supplements The patient's functional ability including ADLs,fall risks, home safety risks, cognitive, and hearing and visual impairment Diet and physical activities Evidence for depression or mood disorders  The patient's weight, height, BMI have been recorded in the chart.  I have made referrals, counseling, and provided education to the patient based on review of the above and I have provided the patient with a written personalized care plan for  preventive services.

## 2020-06-11 NOTE — Patient Instructions (Addendum)
HEALTH MAINTENANCE RECOMMENDATIONS:  It is recommended that you get at least 30 minutes of aerobic exercise at least 5 days/week (for weight loss, you may need as much as 60-90 minutes). This can be any activity that gets your heart rate up. This can be divided in 10-15 minute intervals if needed, but try and build up your endurance at least once a week.  Weight bearing exercise is also recommended twice weekly.  Eat a healthy diet with lots of vegetables, fruits and fiber.  "Colorful" foods have a lot of vitamins (ie green vegetables, tomatoes, red peppers, etc).  Limit sweet tea, regular sodas and alcoholic beverages, all of which has a lot of calories and sugar.  Up to 1 alcoholic drink daily may be beneficial for women (unless trying to lose weight, watch sugars).  Drink a lot of water.  Calcium recommendations are 1200-1500 mg daily (1500 mg for postmenopausal women or women without ovaries), and vitamin D 1000 IU daily.  This should be obtained from diet and/or supplements (vitamins), and calcium should not be taken all at once, but in divided doses.  Monthly self breast exams and yearly mammograms for women over the age of 67 is recommended.  Sunscreen of at least SPF 30 should be used on all sun-exposed parts of the skin when outside between the hours of 10 am and 4 pm (not just when at beach or pool, but even with exercise, golf, tennis, and yard work!)  Use a sunscreen that says "broad spectrum" so it covers both UVA and UVB rays, and make sure to reapply every 1-2 hours.  Remember to change the batteries in your smoke detectors when changing your clock times in the spring and fall. Carbon monoxide detectors are recommended for your home.  Use your seat belt every time you are in a car, and please drive safely and not be distracted with cell phones and texting while driving.   Lisa [name] , Thank you for taking time to come for your Medicare Wellness Visit. I appreciate your ongoing  commitment to your health goals. Please review the following plan we discussed and let me know if I can assist you in the future.    This is a list of the screening recommended for you and due dates:  Health Maintenance  Topic Date Due  .  Hepatitis C: One time screening is recommended by Center for Disease Control  (CDC) for  adults born from [birth year] through 1965.   Never done  . Flu Shot  03/09/2020  . Tetanus Vaccine  01/12/2022  . DEXA scan (bone density measurement)  Completed  . COVID-19 Vaccine  Completed  . Pneumonia vaccines  Completed    Hepatitis C screening is a new recommendation that ALL ADULTS should be screened (rather than just those born between '[birth year] and '65).  We will see if we can add on this test to the blood you had drawn on 11/1.  Bone density test should be repeated 07/2021 (along with mammogram, unless you prefer to get mammograms done yearly--last done 07/2019).  Consider cutting the seroquel in 1/2 for a few days to see if your sleep is affected by the lower dose. If you're still sleeping fine, consider trying to sleep without it.   If you change your mind about repeating the Cologuard screen for colon cancer, let us know and we will do the referral.  Return at a nurse visit for pneumovax (pneumonia vaccine booster). It needs to be 2 weeks  from today's flu shot, but you can delay it further to a more convenient time if you prefer.

## 2020-06-12 ENCOUNTER — Ambulatory Visit (INDEPENDENT_AMBULATORY_CARE_PROVIDER_SITE_OTHER): Payer: Medicare Other | Admitting: Family Medicine

## 2020-06-12 ENCOUNTER — Other Ambulatory Visit: Payer: Self-pay

## 2020-06-12 ENCOUNTER — Encounter: Payer: Self-pay | Admitting: Family Medicine

## 2020-06-12 VITALS — BP 122/74 | HR 72 | Ht 61.5 in | Wt 146.4 lb

## 2020-06-12 DIAGNOSIS — M85852 Other specified disorders of bone density and structure, left thigh: Secondary | ICD-10-CM | POA: Diagnosis not present

## 2020-06-12 DIAGNOSIS — Z23 Encounter for immunization: Secondary | ICD-10-CM | POA: Diagnosis not present

## 2020-06-12 DIAGNOSIS — E039 Hypothyroidism, unspecified: Secondary | ICD-10-CM | POA: Diagnosis not present

## 2020-06-12 DIAGNOSIS — E7800 Pure hypercholesterolemia, unspecified: Secondary | ICD-10-CM | POA: Diagnosis not present

## 2020-06-12 DIAGNOSIS — Z1211 Encounter for screening for malignant neoplasm of colon: Secondary | ICD-10-CM

## 2020-06-12 DIAGNOSIS — I10 Essential (primary) hypertension: Secondary | ICD-10-CM | POA: Diagnosis not present

## 2020-06-12 DIAGNOSIS — G4700 Insomnia, unspecified: Secondary | ICD-10-CM

## 2020-06-12 DIAGNOSIS — F411 Generalized anxiety disorder: Secondary | ICD-10-CM

## 2020-06-12 DIAGNOSIS — Z0000 Encounter for general adult medical examination without abnormal findings: Secondary | ICD-10-CM

## 2020-06-13 LAB — SPECIMEN STATUS REPORT

## 2020-06-13 LAB — HEPATITIS C ANTIBODY: Hep C Virus Ab: <0.1 s/co ratio (ref 0.0–0.9)

## 2020-06-18 ENCOUNTER — Other Ambulatory Visit: Payer: Self-pay | Admitting: *Deleted

## 2020-06-18 DIAGNOSIS — E039 Hypothyroidism, unspecified: Secondary | ICD-10-CM

## 2020-06-18 MED ORDER — SYNTHROID 50 MCG PO TABS: ORAL_TABLET | ORAL | 1 refills | Status: DC

## 2020-06-23 ENCOUNTER — Other Ambulatory Visit: Payer: Self-pay | Admitting: Family Medicine

## 2020-06-23 DIAGNOSIS — G4700 Insomnia, unspecified: Secondary | ICD-10-CM

## 2020-06-23 NOTE — Telephone Encounter (Signed)
Is this okay to refill? 

## 2020-07-24 DIAGNOSIS — H524 Presbyopia: Secondary | ICD-10-CM | POA: Diagnosis not present

## 2020-07-24 DIAGNOSIS — Z961 Presence of intraocular lens: Secondary | ICD-10-CM | POA: Diagnosis not present

## 2020-07-24 DIAGNOSIS — H52203 Unspecified astigmatism, bilateral: Secondary | ICD-10-CM | POA: Diagnosis not present

## 2020-07-24 DIAGNOSIS — H26492 Other secondary cataract, left eye: Secondary | ICD-10-CM | POA: Diagnosis not present

## 2020-08-06 ENCOUNTER — Encounter: Payer: Self-pay | Admitting: Family Medicine

## 2020-08-08 ENCOUNTER — Other Ambulatory Visit: Payer: Self-pay | Admitting: Family Medicine

## 2020-08-08 DIAGNOSIS — I10 Essential (primary) hypertension: Secondary | ICD-10-CM

## 2020-08-08 DIAGNOSIS — E7800 Pure hypercholesterolemia, unspecified: Secondary | ICD-10-CM

## 2020-08-19 ENCOUNTER — Other Ambulatory Visit (INDEPENDENT_AMBULATORY_CARE_PROVIDER_SITE_OTHER): Payer: Medicare Other

## 2020-08-19 ENCOUNTER — Other Ambulatory Visit: Payer: Self-pay

## 2020-08-19 DIAGNOSIS — Z23 Encounter for immunization: Secondary | ICD-10-CM

## 2020-12-15 ENCOUNTER — Other Ambulatory Visit: Payer: Self-pay | Admitting: Family Medicine

## 2020-12-15 DIAGNOSIS — E039 Hypothyroidism, unspecified: Secondary | ICD-10-CM

## 2021-01-06 ENCOUNTER — Telehealth (INDEPENDENT_AMBULATORY_CARE_PROVIDER_SITE_OTHER): Payer: Medicare Other | Admitting: Family Medicine

## 2021-01-06 ENCOUNTER — Other Ambulatory Visit: Payer: Self-pay

## 2021-01-06 ENCOUNTER — Encounter: Payer: Self-pay | Admitting: Family Medicine

## 2021-01-06 VITALS — Temp 98.3°F | Wt 146.0 lb

## 2021-01-06 DIAGNOSIS — J029 Acute pharyngitis, unspecified: Secondary | ICD-10-CM | POA: Diagnosis not present

## 2021-01-06 NOTE — Progress Notes (Signed)
   Subjective:    Patient ID: Lisa [name], female    DOB: 04/02/1941, 80 y.o.   MRN: [id number]  HPI Documentation for virtual audio and video telecommunications through Caregility encounter: The patient was located at home. 2 patient identifiers used.  The provider was located in the office. The patient did consent to this visit and is aware of possible charges through their insurance for this visit. The other persons participating in this telemedicine service were none. Time spent on call was 5 minutes and in review of previous records >20 minutes total for counseling and coordination of care. This virtual service is not related to other E/M service within previous 7 days. She states that Sunday she developed a sore throat as well as some postnasal drainage and felt warm although her temperature was normal.  She has been using a cough medicine that she does not like.  She is COVID tested x2 which was negative.  No earache, cough, congestion, smell or taste change.  She does state that tea and Naprosyn does help with her symptoms.  Review of Systems     Objective:   Physical Exam Alert and in no distress.  Her voice was slightly gravelly.       Assessment & Plan:  Sore throat  Recommend supportive care with gargling and Tylenol.  Call if continued difficulty.  Explained that I do not think an antibiotic would be worth using.  She was comfortable with that.

## 2021-01-07 ENCOUNTER — Telehealth: Payer: Self-pay | Admitting: *Deleted

## 2021-01-07 NOTE — Telephone Encounter (Signed)
Patient called, did a virtual with JCL yesterday. She is in so much pain due to her ST. She has lots of green/yellow mucus that is just sitting in her throat. Not really coughing it up or blowing it out. Has low grade temp. Symptoms began Sunday-neg covid tests Mon and Tues. She is at [facility] and is asking if you would call in Magic Mouthwash for the pain since nothing else was called in-she just doesn't know what else to do. I put the pharmacy she requested under medications.

## 2021-01-07 NOTE — Telephone Encounter (Signed)
We do not use magic mouthwash for this (we will use lidocaine only if there are ulcers/sores). My recommendation is to use tylenol and ibuprofen as needed for sore throat, salt water gargles, and she can also try chloraseptic spray for her throat.  I recommend using mucinex, since she is having postnasal drainage which can irritate the throat, and she can try sinus rinses also.  If still so severe, she may need to be seen in person (urgent care) to further evaluate.

## 2021-01-07 NOTE — Telephone Encounter (Signed)
Left detailed message for patient and told her to please call back if she had any further questions.

## 2021-06-08 ENCOUNTER — Telehealth: Payer: Self-pay

## 2021-06-08 DIAGNOSIS — E7800 Pure hypercholesterolemia, unspecified: Secondary | ICD-10-CM

## 2021-06-08 DIAGNOSIS — E039 Hypothyroidism, unspecified: Secondary | ICD-10-CM

## 2021-06-08 DIAGNOSIS — Z5181 Encounter for therapeutic drug level monitoring: Secondary | ICD-10-CM

## 2021-06-08 DIAGNOSIS — I10 Essential (primary) hypertension: Secondary | ICD-10-CM

## 2021-06-08 NOTE — Telephone Encounter (Signed)
Orders entered

## 2021-06-08 NOTE — Telephone Encounter (Signed)
Pt. Called stating she has a CPE with you on 06/24/21 and she wanted to get her fasting labs before hand on 06/18/21. I did schedule her on 06/18/21 for her labs if you could put the order in for that. Thank you.

## 2021-06-18 ENCOUNTER — Other Ambulatory Visit (INDEPENDENT_AMBULATORY_CARE_PROVIDER_SITE_OTHER): Payer: Medicare Other

## 2021-06-18 ENCOUNTER — Other Ambulatory Visit: Payer: Self-pay

## 2021-06-18 DIAGNOSIS — Z23 Encounter for immunization: Secondary | ICD-10-CM | POA: Diagnosis not present

## 2021-06-18 DIAGNOSIS — E039 Hypothyroidism, unspecified: Secondary | ICD-10-CM | POA: Diagnosis not present

## 2021-06-18 DIAGNOSIS — I10 Essential (primary) hypertension: Secondary | ICD-10-CM

## 2021-06-18 DIAGNOSIS — E7800 Pure hypercholesterolemia, unspecified: Secondary | ICD-10-CM | POA: Diagnosis not present

## 2021-06-18 DIAGNOSIS — Z5181 Encounter for therapeutic drug level monitoring: Secondary | ICD-10-CM

## 2021-06-19 LAB — COMPREHENSIVE METABOLIC PANEL
ALT: 28 IU/L (ref 0–32)
AST: 24 IU/L (ref 0–40)
Albumin/Globulin Ratio: 2 (ref 1.2–2.2)
Albumin: 4.7 g/dL (ref 3.7–4.7)
Alkaline Phosphatase: 70 IU/L (ref 44–121)
BUN/Creatinine Ratio: 11 — ABNORMAL LOW (ref 12–28)
BUN: 9 mg/dL (ref 8–27)
Bilirubin Total: 0.5 mg/dL (ref 0.0–1.2)
CO2: 26 mmol/L (ref 20–29)
Calcium: 10.4 mg/dL — ABNORMAL HIGH (ref 8.7–10.3)
Chloride: 96 mmol/L (ref 96–106)
Creatinine, Ser: 0.85 mg/dL (ref 0.57–1.00)
Globulin, Total: 2.4 g/dL (ref 1.5–4.5)
Glucose: 97 mg/dL (ref 70–99)
Potassium: 4.7 mmol/L (ref 3.5–5.2)
Sodium: 133 mmol/L — ABNORMAL LOW (ref 134–144)
Total Protein: 7.1 g/dL (ref 6.0–8.5)
eGFR: 69 mL/min/{1.73_m2} (ref >59)

## 2021-06-19 LAB — CBC WITH DIFFERENTIAL/PLATELET
Basophils Absolute: 0.1 10*3/uL (ref 0.0–0.2)
Basos: 1 %
EOS (ABSOLUTE): 0.2 10*3/uL (ref 0.0–0.4)
Eos: 3 %
Hematocrit: 40.4 % (ref 34.0–46.6)
Hemoglobin: 13.5 g/dL (ref 11.1–15.9)
Immature Grans (Abs): 0 10*3/uL (ref 0.0–0.1)
Immature Granulocytes: 0 %
Lymphocytes Absolute: 1.4 10*3/uL (ref 0.7–3.1)
Lymphs: 26 %
MCH: 29.9 pg (ref 26.6–33.0)
MCHC: 33.4 g/dL (ref 31.5–35.7)
MCV: 90 fL (ref 79–97)
Monocytes Absolute: 0.5 10*3/uL (ref 0.1–0.9)
Monocytes: 9 %
Neutrophils Absolute: 3.4 10*3/uL (ref 1.4–7.0)
Neutrophils: 61 %
Platelets: 382 10*3/uL (ref 150–450)
RBC: 4.51 x10E6/uL (ref 3.77–5.28)
RDW: 12.3 % (ref 11.7–15.4)
WBC: 5.5 10*3/uL (ref 3.4–10.8)

## 2021-06-19 LAB — LIPID PANEL
Chol/HDL Ratio: 2.5 ratio (ref 0.0–4.4)
Cholesterol, Total: 162 mg/dL (ref 100–199)
HDL: 65 mg/dL (ref >39)
LDL Chol Calc (NIH): 80 mg/dL (ref 0–99)
Triglycerides: 93 mg/dL (ref 0–149)
VLDL Cholesterol Cal: 17 mg/dL (ref 5–40)

## 2021-06-19 LAB — TSH: TSH: 1.72 u[IU]/mL (ref 0.450–4.500)

## 2021-06-23 NOTE — Patient Instructions (Addendum)
  HEALTH MAINTENANCE RECOMMENDATIONS:  It is recommended that you get at least 30 minutes of aerobic exercise at least 5 days/week (for weight loss, you may need as much as 60-90 minutes). This can be any activity that gets your heart rate up. This can be divided in 10-15 minute intervals if needed, but try and build up your endurance at least once a week.  Weight bearing exercise is also recommended twice weekly.  Eat a healthy diet with lots of vegetables, fruits and fiber.  "Colorful" foods have a lot of vitamins (ie green vegetables, tomatoes, red peppers, etc).  Limit sweet tea, regular sodas and alcoholic beverages, all of which has a lot of calories and sugar.  Up to 1 alcoholic drink daily may be beneficial for women (unless trying to lose weight, watch sugars).  Drink a lot of water.  Calcium recommendations are 1200-1500 mg daily (1500 mg for postmenopausal women or women without ovaries), and vitamin D 1000 IU daily.  This should be obtained from diet and/or supplements (vitamins), and calcium should not be taken all at once, but in divided doses.  Monthly self breast exams and yearly mammograms for women over the age of 63 is recommended.  Sunscreen of at least SPF 30 should be used on all sun-exposed parts of the skin when outside between the hours of 10 am and 4 pm (not just when at beach or pool, but even with exercise, golf, tennis, and yard work!)  Use a sunscreen that says "broad spectrum" so it covers both UVA and UVB rays, and make sure to reapply every 1-2 hours.  Remember to change the batteries in your smoke detectors when changing your clock times in the spring and fall. Carbon monoxide detectors are recommended for your home.  Use your seat belt every time you are in a car, and please drive safely and not be distracted with cell phones and texting while driving.   Ms. Crotteau , Thank you for taking time to come for your Medicare Wellness Visit. I appreciate your ongoing  commitment to your health goals. Please review the following plan we discussed and let me know if I can assist you in the future.   This is a list of the screening recommended for you and due dates:  Health Maintenance  Topic Date Due   Tetanus Vaccine  01/12/2022   Pneumonia Vaccine  Completed   Flu Shot  Completed   DEXA scan (bone density measurement)  Completed   COVID-19 Vaccine  Completed   Zoster (Shingles) Vaccine  Completed   HPV Vaccine  Aged Out   Mammogram and bone density test are both due in December, please schedule.  Tetanus booster (TdaP) will be due in 01/2022.  This you need to get from the pharmacy--it is covered by part D, and I was recently told that there will no longer be any charge for this at the pharmacy.  New guidelines state that you should NOT be taking aspirin (as primary prevention for stroke or heart disease).  Please stop taking daily aspirin.  Contact us when seroquel refills are needed    You should return sooner than 1 year if you notice your blood pressures are consistently over 130/80.

## 2021-06-23 NOTE — Progress Notes (Signed)
Chief Complaint  Patient presents with   Medicare Wellness    Nonfasting AWV/CPE, labs already done. No new concerns. Declined Cologuard again due to the fact if anything was found, she said she doesn't know of she would even do anything about it.     Lisa [name] is a 80 y.o. female who presents for annual physical, Medicare wellness visit and follow-up on chronic medical conditions.  She had labs done prior to her visit, see below.  Hypertension follow-up:  She is compliant with taking her Avapro 300mg  daily. Denies headaches, dizziness, chest pain, palpitations.  Denies edema; denies side effects.  BP's are running  125-136/66-76, pulse 70s.   Hyperlipidemia follow-up: Patient is reportedly following a low-fat, low cholesterol diet. Compliant with medications (atorvastatin 20mg ) and denies medication side effects.    Hypothyroidism: She denies fatigue, hair, skin, bowel changes. Denies any mood changes. Denies dysphagia or any thyroid concerns. Takes Synthroid 50 mcg on empty stomach, separate from food and other medications. She takes MVI at lunch, but calcium earlier in the day, closer to the synthroid.  Insomnia: Well controlled with Seroquel.   Anxiety: She tapered off Lexapro after her husband died.  She denies any depression or significant anxiety. She reports moods are still very good, is very happy.  She is in a grief support group.  She feels she lost her husband a while ago (dementia), that she isn't really struggling.   Osteopenia:   Takes Calcium with D once daily, gets regular exercise, including weights (with silver sneaker classes)  She reports previously taking Actonel for 10 years, stopped it on her own, concerned about risks.  She had repeat DEXA in 07/2019 which showed stable osteopenia.  Seasonal allergies: taking OTC Allegra seasonally.  It is worse in the Spring (not as bad since wearing masks).  Uses Allegra prn, not needing now. Hasn't had any recurrence of  vertigo in quite some time.    Immunization History  Administered Date(s) Administered   Fluad Quad(high Dose 65+) 05/22/2019, 06/12/2020, 06/18/2021   Influenza Whole 05/02/2010   Influenza, High Dose Seasonal PF 05/16/2014, 04/23/2015, 05/05/2016, 05/12/2017, 05/24/2018   PFIZER(Purple Top)SARS-COV-2 Vaccination 08/20/2019, 09/10/2019, 05/15/2020   Pfizer Covid-19 Vaccine Bivalent Booster 37yrs & up 04/29/2021   Pneumococcal Conjugate-13 04/17/2014   Pneumococcal Polysaccharide-23 09/21/2005, 08/19/2020   Td 01/02/2002   Tdap 01/13/2012   Zoster Recombinat (Shingrix) 05/20/2018, 08/21/2018   Zoster, Live 02/26/2011   Last Pap smear: 04/2015, normal no high risk HPV Last mammo: 07/2019 Colonoscopy 5/08  (refuses to do again); negative Cologuard 05/2017 Dexa 07/2019 T -2.0 at L fem neck.  Dentist and ophtho regularly   Exercise:  Lisa [name]'s Silver Sneakers 3x/week (50 minutes, mixture of cardio and weights) Vitamin D last checked 04/2015 normal at 40   Patient Care Team: Rita Ohara, MD as PCP - General (Family Medicine) Dentist: Dr. Noah Charon Ophtho: Dr. Gillian Scarce at [facility] ophtho Cardiologist: Dr. Claiborne Billings (no longer sees) Endocrinologist: Dr. Chalmers Cater (no longer sees) Sports medicine: Dr. Charlann Boxer (no longer sees)  Depression Screening: [facility] Office Visit from 06/24/2021 in [facility]  PHQ-2 Total Score 0        Falls screen:  Fall Risk  06/24/2021 06/12/2020 05/24/2019 05/18/2018 05/12/2017  Falls in the past year? 0 0 0 No No  Number falls in past yr: 0 - 0 - -  Injury with Fall? 0 - 0 - -  Risk for fall due to : No Fall Risks - - - -  Follow up Falls evaluation completed - - - -     Functional Status Survey: Is the patient deaf or have difficulty hearing?: Yes (has hearing aides but doesn't wear very often) Does the patient have difficulty seeing, even when wearing glasses/contacts?: No Does the patient have difficulty concentrating, remembering,  or making decisions?: No Does the patient have difficulty walking or climbing stairs?: No Does the patient have difficulty dressing or bathing?: No Does the patient have difficulty doing errands alone such as visiting a doctor's office or shopping?: No  Mini-Cog Scoring: 5   End of Life Discussion:  Patient has a living will and medical power of attorney.      PMH, PSH, SH and FH were reviewed and updated  Outpatient Encounter Medications as of 06/24/2021  Medication Sig Note   atorvastatin (LIPITOR) 20 MG tablet TAKE 1 TABLET BY MOUTH  DAILY    Calcium Carbonate-Vitamin D (CALTRATE 600+D PO) Take 1 tablet by mouth daily.    cholecalciferol (VITAMIN D) 1000 units tablet Take 1,000 Units by mouth daily.    fexofenadine (ALLEGRA) 180 MG tablet Take 180 mg by mouth daily. 06/24/2021: Uses prn, usually just in the Spring   irbesartan (AVAPRO) 300 MG tablet TAKE 1 TABLET BY MOUTH  DAILY    Multiple Vitamins-Minerals (CENTRUM SILVER PO) Take 1 tablet by mouth daily.    QUEtiapine (SEROQUEL) 25 MG tablet TAKE 1 TABLET BY MOUTH AT  BEDTIME 06/24/2021: Has been doing well with just 1/2 tablet qHS   SYNTHROID 50 MCG tablet TAKE 1 TABLET DAILY BEFORE BREAKFAST (FOR HYPOTHYROIDISM; REQUESTING 1 YEAR SUPPLY)    [DISCONTINUED] aspirin 81 MG tablet Take 81 mg by mouth daily.    fluocinonide (LIDEX) 0.05 % gel Apply 1 application topically 4 (four) times daily. Reported on 12/12/2015 (Patient not taking: Reported on 06/24/2021) 05/12/2017: Uses prn for canker sores; much better since changer her drinking water   [DISCONTINUED] ALPRAZolam (XANAX) 0.25 MG tablet TAKE 1 TABLET BY MOUTH 3 TIMES A DAY AS NEEDED FOR ANXIETY (Patient not taking: No sig reported) 06/24/2021: Can't remember last time she used   No facility-administered encounter medications on file as of 06/24/2021.   Taking aspirin 81mg  prior to today's visit  Allergies  Allergen Reactions   Amoxicillin Rash   Erythromycin Rash   Sulfa  Antibiotics Rash     ROS: The patient denies anorexia, fever, weight changes, headaches, vision changes, ear pain, sore throat, breast concerns, chest pain, palpitations, dizziness, syncope, dyspnea on exertion, cough, swelling, nausea, vomiting, diarrhea, constipation, abdominal pain, melena, hematochezia, indigestion/heartburn, hematuria, dysuria, vaginal bleeding, discharge, odor or itch, genital lesions, numbness, tingling, weakness, tremor, suspicious skin lesions, depression, anxiety, abnormal bleeding/bruising, or enlarged lymph nodes.  Got hearing aids.  Denies significant tinnitus or vertigo. Slight leakage of urine, urinary frequency, unchanged. Moods are good. Denies any change to fatty growth on the back of her neck. Denies any joint pains, neck pain.   PHYSICAL EXAM:  BP 134/78   Pulse 68   Ht 5\' 1"  (1.549 m)   Wt 140 lb 12.8 oz (63.9 kg)   BMI 26.60 kg/m   Wt Readings from Last 3 Encounters:  06/24/21 140 lb 12.8 oz (63.9 kg)  01/06/21 146 lb (66.2 kg)  06/12/20 146 lb 6.4 oz (66.4 kg)    General Appearance:   Alert, cooperative, no distress, appears stated age.  Head:   Normocephalic, without obvious abnormality, atraumatic    Eyes:   PERRL, conjunctiva/corneas clear, EOM's intact,  fundi benign    Ears:   Normal TM's and external ear canals    Nose:   Not examined, wearing mask due to COVID-19 pandemic  Throat:   Not examined, wearing mask due to COVID-19 pandemic  Neck:   Supple, no lymphadenopathy; thyroid: no enlargement/tenderness/nodules; no carotid bruit or JVD. c-spine nontender. There is a 3.5cm soft tissue mass at posterior neck.  Appears fairly central, slightly more to the right.. Nontender. No change from last year  Back:   Spine nontender, no curvature, ROM normal, no CVA tenderness    Lungs:   Clear to auscultation bilaterally without wheezes, rales or ronchi; respirations unlabored    Chest Wall:   No tenderness or deformity    Heart:   Regular rate  and rhythm, S1 and S2 normal, no murmur, rub or gallop    Breast Exam:   No tenderness, masses, or nipple discharge or inversion. No axillary lymphadenopathy    Abdomen:   Soft, non-tender, nondistended, normoactive bowel sounds, no masses, no hepatosplenomegaly    Genitalia:   Exam declined by patient  Rectal:   Exam declined by patient  Extremities:   No clubbing, cyanosis or edema.    Pulses:   2+ and symmetric all extremities    Skin:   Skin color, texture, turgor normal, no rashes or lesions. Many scattered SK's and angiomas   Lymph nodes:  Cervical, supraclavicular, and axillary nodes normal   Neurologic:   Normal strength, sensation and gait; reflexes 2-3+ and symmetric throughout                 Psych:  Normal mood, affect, hygiene and grooming     Chemistry      Component Value Date/Time   NA 133 (L) 06/18/2021 0842   K 4.7 06/18/2021 0842   CL 96 06/18/2021 0842   CO2 26 06/18/2021 0842   BUN 9 06/18/2021 0842   CREATININE 0.85 06/18/2021 0842   CREATININE 0.66 05/05/2017 0745      Component Value Date/Time   CALCIUM 10.4 (H) 06/18/2021 0842   ALKPHOS 70 06/18/2021 0842   AST 24 06/18/2021 0842   ALT 28 06/18/2021 0842   BILITOT 0.5 06/18/2021 0842     Fasting glucose 97  Lab Results  Component Value Date   WBC 5.5 06/18/2021   HGB 13.5 06/18/2021   HCT 40.4 06/18/2021   MCV 90 06/18/2021   PLT 382 06/18/2021   Lab Results  Component Value Date   CHOL 162 06/18/2021   HDL 65 06/18/2021   LDLCALC 80 06/18/2021   TRIG 93 06/18/2021   CHOLHDL 2.5 06/18/2021   Lab Results  Component Value Date   TSH 1.720 06/18/2021    ASSESSMENT/PLAN:  Annual physical exam  Medicare annual wellness visit, subsequent  Osteopenia of neck of left femur - DEXA due 07/2021. Cont Ca, D, weight-bearing exercise  Essential hypertension, benign - controlled  Pure hypercholesterolemia - well controlled, cont lipitor  Hypothyroidism, unspecified type - continue current  dose of synthroid - Plan: SYNTHROID 50 MCG tablet   Stop aspirin, new guidelines reviewed  Pt to contact us when seroquel refills are needed  (recently got 90d supply, only taking 1/2)  Discussed monthly self breast exams and yearly mammograms (pt prefers to do q2 years, due 07/2021); at least 30 minutes of aerobic activity at least 5 days/week and weight-bearing exercise 2x/week; proper sunscreen use reviewed; healthy diet, including goals of calcium and vitamin D intake and alcohol recommendations (  less than or equal to 1 drink/day) reviewed; regular seatbelt use; changing batteries in smoke detectors.  Immunization recommendations discussed--UTD.  Tdap will be due from pharmacy in 01/2022. Colon cancer screening recommendations--she refuses to have another colonoscopy; Negative Cologuard 05/2017--discussed repeating Cologuard, patient declines. F/u DEXA due 07/2021.   Pt is full code, full care. MOST form reviewed and updated  F/u 6 months med check    Medicare Attestation I have personally reviewed: The patient's medical and social history Their use of alcohol, tobacco or illicit drugs Their current medications and supplements The patient's functional ability including ADLs,fall risks, home safety risks, cognitive, and hearing and visual impairment Diet and physical activities Evidence for depression or mood disorders  The patient's weight, height, BMI have been recorded in the chart.  I have made referrals, counseling, and provided education to the patient based on review of the above and I have provided the patient with a written personalized care plan for preventive services.

## 2021-06-24 ENCOUNTER — Encounter: Payer: Self-pay | Admitting: Family Medicine

## 2021-06-24 ENCOUNTER — Telehealth: Payer: Self-pay | Admitting: Family Medicine

## 2021-06-24 ENCOUNTER — Ambulatory Visit (INDEPENDENT_AMBULATORY_CARE_PROVIDER_SITE_OTHER): Payer: Medicare Other | Admitting: Family Medicine

## 2021-06-24 ENCOUNTER — Other Ambulatory Visit: Payer: Self-pay

## 2021-06-24 VITALS — BP 134/78 | HR 68 | Ht 61.0 in | Wt 140.8 lb

## 2021-06-24 DIAGNOSIS — Z0000 Encounter for general adult medical examination without abnormal findings: Secondary | ICD-10-CM

## 2021-06-24 DIAGNOSIS — E039 Hypothyroidism, unspecified: Secondary | ICD-10-CM

## 2021-06-24 DIAGNOSIS — Z5181 Encounter for therapeutic drug level monitoring: Secondary | ICD-10-CM

## 2021-06-24 DIAGNOSIS — M85852 Other specified disorders of bone density and structure, left thigh: Secondary | ICD-10-CM

## 2021-06-24 DIAGNOSIS — I10 Essential (primary) hypertension: Secondary | ICD-10-CM | POA: Diagnosis not present

## 2021-06-24 DIAGNOSIS — E7800 Pure hypercholesterolemia, unspecified: Secondary | ICD-10-CM

## 2021-06-24 MED ORDER — SYNTHROID 50 MCG PO TABS: ORAL_TABLET | ORAL | 3 refills | Status: DC

## 2021-06-24 NOTE — Telephone Encounter (Signed)
Pt is coming in Nov 20th 2023 she would like to come in the week before to have her blood work done if that is ok

## 2021-06-24 NOTE — Telephone Encounter (Signed)
Orders entered

## 2021-06-25 ENCOUNTER — Encounter: Payer: Self-pay | Admitting: Family Medicine

## 2021-08-28 ENCOUNTER — Other Ambulatory Visit: Payer: Self-pay

## 2021-08-28 ENCOUNTER — Telehealth: Payer: Self-pay

## 2021-08-28 DIAGNOSIS — I10 Essential (primary) hypertension: Secondary | ICD-10-CM

## 2021-08-28 DIAGNOSIS — E7800 Pure hypercholesterolemia, unspecified: Secondary | ICD-10-CM

## 2021-08-28 MED ORDER — IRBESARTAN 300 MG PO TABS: 300.0000 mg | ORAL_TABLET | Freq: Every day | ORAL | 3 refills | Status: DC

## 2021-08-28 MED ORDER — ATORVASTATIN CALCIUM 20 MG PO TABS: 20.0000 mg | ORAL_TABLET | Freq: Every day | ORAL | 3 refills | Status: DC

## 2021-08-28 NOTE — Telephone Encounter (Signed)
Pt needs refill Irbesartan & Lipitor to [facility]

## 2021-10-02 ENCOUNTER — Other Ambulatory Visit: Payer: Self-pay | Admitting: Medical

## 2021-10-02 ENCOUNTER — Telehealth: Payer: Self-pay | Admitting: Family Medicine

## 2021-10-02 DIAGNOSIS — G4700 Insomnia, unspecified: Secondary | ICD-10-CM

## 2021-10-02 MED ORDER — QUETIAPINE FUMARATE 25 MG PO TABS: 25.0000 mg | ORAL_TABLET | Freq: Every day | ORAL | 0 refills | Status: DC

## 2021-10-02 NOTE — Telephone Encounter (Signed)
Pharmacy sent refill request on quetaiapne please send to the [facility]) - [place], [place] - [address] [place]

## 2022-02-04 ENCOUNTER — Other Ambulatory Visit: Payer: Self-pay | Admitting: Medical

## 2022-02-04 DIAGNOSIS — G4700 Insomnia, unspecified: Secondary | ICD-10-CM

## 2022-03-25 ENCOUNTER — Other Ambulatory Visit: Payer: Self-pay | Admitting: *Deleted

## 2022-03-25 DIAGNOSIS — I10 Essential (primary) hypertension: Secondary | ICD-10-CM

## 2022-03-25 DIAGNOSIS — E7800 Pure hypercholesterolemia, unspecified: Secondary | ICD-10-CM

## 2022-03-25 MED ORDER — IRBESARTAN 300 MG PO TABS: 300.0000 mg | ORAL_TABLET | Freq: Every day | ORAL | 0 refills | Status: DC

## 2022-03-25 MED ORDER — ATORVASTATIN CALCIUM 20 MG PO TABS: 20.0000 mg | ORAL_TABLET | Freq: Every day | ORAL | 0 refills | Status: DC

## 2022-05-22 ENCOUNTER — Other Ambulatory Visit: Payer: Self-pay | Admitting: Family Medicine

## 2022-05-22 DIAGNOSIS — E7800 Pure hypercholesterolemia, unspecified: Secondary | ICD-10-CM

## 2022-05-27 ENCOUNTER — Other Ambulatory Visit: Payer: Self-pay | Admitting: Family Medicine

## 2022-05-27 DIAGNOSIS — I10 Essential (primary) hypertension: Secondary | ICD-10-CM

## 2022-06-15 ENCOUNTER — Other Ambulatory Visit: Payer: Medicare Other

## 2022-06-15 DIAGNOSIS — E039 Hypothyroidism, unspecified: Secondary | ICD-10-CM

## 2022-06-15 DIAGNOSIS — Z5181 Encounter for therapeutic drug level monitoring: Secondary | ICD-10-CM | POA: Diagnosis not present

## 2022-06-15 DIAGNOSIS — E7800 Pure hypercholesterolemia, unspecified: Secondary | ICD-10-CM | POA: Diagnosis not present

## 2022-06-15 DIAGNOSIS — I10 Essential (primary) hypertension: Secondary | ICD-10-CM | POA: Diagnosis not present

## 2022-06-16 LAB — CBC WITH DIFFERENTIAL/PLATELET
Basophils Absolute: 0.1 10*3/uL (ref 0.0–0.2)
Basos: 1 %
EOS (ABSOLUTE): 0.2 10*3/uL (ref 0.0–0.4)
Eos: 6 %
Hematocrit: 39.7 % (ref 34.0–46.6)
Hemoglobin: 13.4 g/dL (ref 11.1–15.9)
Immature Grans (Abs): 0 10*3/uL (ref 0.0–0.1)
Immature Granulocytes: 0 %
Lymphocytes Absolute: 1.3 10*3/uL (ref 0.7–3.1)
Lymphs: 30 %
MCH: 31 pg (ref 26.6–33.0)
MCHC: 33.8 g/dL (ref 31.5–35.7)
MCV: 92 fL (ref 79–97)
Monocytes Absolute: 0.4 10*3/uL (ref 0.1–0.9)
Monocytes: 9 %
Neutrophils Absolute: 2.3 10*3/uL (ref 1.4–7.0)
Neutrophils: 54 %
Platelets: 358 10*3/uL (ref 150–450)
RBC: 4.32 x10E6/uL (ref 3.77–5.28)
RDW: 13.1 % (ref 11.7–15.4)
WBC: 4.3 10*3/uL (ref 3.4–10.8)

## 2022-06-16 LAB — LIPID PANEL
Chol/HDL Ratio: 2.6 ratio (ref 0.0–4.4)
Cholesterol, Total: 170 mg/dL (ref 100–199)
HDL: 66 mg/dL (ref >39)
LDL Chol Calc (NIH): 92 mg/dL (ref 0–99)
Triglycerides: 63 mg/dL (ref 0–149)
VLDL Cholesterol Cal: 12 mg/dL (ref 5–40)

## 2022-06-16 LAB — COMPREHENSIVE METABOLIC PANEL
ALT: 21 IU/L (ref 0–32)
AST: 23 IU/L (ref 0–40)
Albumin/Globulin Ratio: 2 (ref 1.2–2.2)
Albumin: 4.5 g/dL (ref 3.7–4.7)
Alkaline Phosphatase: 72 IU/L (ref 44–121)
BUN/Creatinine Ratio: 13 (ref 12–28)
BUN: 9 mg/dL (ref 8–27)
Bilirubin Total: 0.4 mg/dL (ref 0.0–1.2)
CO2: 22 mmol/L (ref 20–29)
Calcium: 10.1 mg/dL (ref 8.7–10.3)
Chloride: 103 mmol/L (ref 96–106)
Creatinine, Ser: 0.7 mg/dL (ref 0.57–1.00)
Globulin, Total: 2.3 g/dL (ref 1.5–4.5)
Glucose: 91 mg/dL (ref 70–99)
Potassium: 4.5 mmol/L (ref 3.5–5.2)
Sodium: 139 mmol/L (ref 134–144)
Total Protein: 6.8 g/dL (ref 6.0–8.5)
eGFR: 87 mL/min/{1.73_m2} (ref >59)

## 2022-06-16 LAB — TSH: TSH: 2.32 u[IU]/mL (ref 0.450–4.500)

## 2022-06-21 ENCOUNTER — Other Ambulatory Visit: Payer: Self-pay | Admitting: Medical

## 2022-06-21 DIAGNOSIS — G4700 Insomnia, unspecified: Secondary | ICD-10-CM

## 2022-06-21 NOTE — Telephone Encounter (Signed)
Per fill dates, I don't think pt should need this before her appt next week.   Let me know if I'm wrong and she needs it.

## 2022-06-24 ENCOUNTER — Other Ambulatory Visit: Payer: Medicare Other

## 2022-06-26 NOTE — Patient Instructions (Incomplete)
  HEALTH MAINTENANCE RECOMMENDATIONS:  It is recommended that you get at least 30 minutes of aerobic exercise at least 5 days/week (for weight loss, you may need as much as 60-90 minutes). This can be any activity that gets your heart rate up. This can be divided in 10-15 minute intervals if needed, but try and build up your endurance at least once a week.  Weight bearing exercise is also recommended twice weekly.  Eat a healthy diet with lots of vegetables, fruits and fiber.  "Colorful" foods have a lot of vitamins (ie green vegetables, tomatoes, red peppers, etc).  Limit sweet tea, regular sodas and alcoholic beverages, all of which has a lot of calories and sugar.  Up to 1 alcoholic drink daily may be beneficial for women (unless trying to lose weight, watch sugars).  Drink a lot of water.  Calcium recommendations are 1200-1500 mg daily (1500 mg for postmenopausal women or women without ovaries), and vitamin D 1000 IU daily.  This should be obtained from diet and/or supplements (vitamins), and calcium should not be taken all at once, but in divided doses.  Monthly self breast exams and yearly mammograms for women over the age of 47 is recommended.  Sunscreen of at least SPF 30 should be used on all sun-exposed parts of the skin when outside between the hours of 10 am and 4 pm (not just when at beach or pool, but even with exercise, golf, tennis, and yard work!)  Use a sunscreen that says "broad spectrum" so it covers both UVA and UVB rays, and make sure to reapply every 1-2 hours.  Remember to change the batteries in your smoke detectors when changing your clock times in the spring and fall. Carbon monoxide detectors are recommended for your home.  Use your seat belt every time you are in a car, and please drive safely and not be distracted with cell phones and texting while driving.   Ms. Lisa [name] , Thank you for taking time to come for your Medicare Wellness Visit. I appreciate your ongoing  commitment to your health goals. Please review the following plan we discussed and let me know if I can assist you in the future.   This is a list of the screening recommended for you and due dates:  Health Maintenance  Topic Date Due   Medicare Annual Wellness Visit  04/22/2016   COVID-19 Vaccine (5 - Pfizer series) 09/05/2022   Pneumonia Vaccine  Completed   Flu Shot  Completed   DEXA scan (bone density measurement)  Completed   Zoster (Shingles) Vaccine  Completed   HPV Vaccine  Aged Out   We discussed scheduling mammogram and bone density test (it has been almost 3 years since these were last done).  I recommend getting the RSV vaccine; you need to get this from the pharmacy. Please get Tetanus booster (TdaP) from the pharmacy. This should be separated from the RSV vaccine by 2 weeks.

## 2022-06-26 NOTE — Progress Notes (Unsigned)
No chief complaint on file.   Lisa [name] is a 81 y.o. female who presents for annual physical, Medicare wellness visit and follow-up on chronic medical conditions.  She had labs done prior to her visit, see below.  Hypertension follow-up:  She is compliant with taking her Avapro 300mg  daily. Denies headaches, dizziness, chest pain, palpitations.  Denies edema; denies side effects.  BP's are running     Hyperlipidemia follow-up: Patient is reportedly following a low-fat, low cholesterol diet. Compliant with medications (atorvastatin 20mg ) and denies medication side effects.    Hypothyroidism: She denies fatigue, hair, skin, bowel changes. Denies any mood changes. Denies dysphagia or any thyroid concerns. Takes Synthroid 50 mcg on empty stomach, separate from food and other medications. She takes MVI at lunch, but calcium earlier in the day, closer to the synthroid.  CHANGED???  Insomnia: Well controlled with Seroquel.   Anxiety: She tapered off Lexapro after her husband died.  She denies any depression or significant anxiety. She reports moods are still very good, is very happy.  She is in a grief support group.     Osteopenia:   Takes Calcium with D once daily, gets regular exercise, including weights (with silver sneaker classes)  She reports previously taking Actonel for 10 years, stopped it on her own, concerned about risks.  She had repeat DEXA in 07/2019 which showed stable osteopenia.  Seasonal allergies: taking OTC Allegra seasonally.  It is worse in the Spring, does well using Allegra prn. Hasn't had any recurrence of vertigo in quite some time.    Immunization History  Administered Date(s) Administered   Fluad Quad(high Dose 65+) 05/22/2019, 06/12/2020, 06/18/2021   Influenza Split 06/01/2022   Influenza Whole 05/02/2010   Influenza, High Dose Seasonal PF 05/16/2014, 04/23/2015, 05/05/2016, 05/12/2017, 05/24/2018   Moderna Sars-Covid-2 Vaccination 05/06/2022    PFIZER(Purple Top)SARS-COV-2 Vaccination 08/20/2019, 09/10/2019, 05/15/2020   Pfizer Covid-19 Vaccine Bivalent Booster 73yrs & up 04/29/2021   Pneumococcal Conjugate-13 04/17/2014   Pneumococcal Polysaccharide-23 09/21/2005, 08/19/2020   Td 01/02/2002   Tdap 01/13/2012   Zoster Recombinat (Shingrix) 05/20/2018, 08/21/2018   Zoster, Live 02/26/2011   Last Pap smear: 04/2015, normal no high risk HPV Last mammo: 07/2019 Colonoscopy 5/08  (refuses to do again); negative Cologuard 05/2017 Dexa 07/2019 T -2.0 at L fem neck.  Dentist and ophtho regularly   Exercise:  06/2017 [employer]neakers 3x/week (50 minutes, mixture of cardio and weights) Vitamin D last checked 04/2015 normal at 40   Patient Care Team: Raynelle Fanning, MD as PCP - General (Family Medicine) Dr. 05/2015 as Consulting Physician (Dentistry) Joselyn Arrow, MD as Consulting Physician (Ophthalmology) Dario Ave, MD as Consulting Physician (Cardiology) Antony Contras, MD as Referring Physician (Endocrinology) Lennette Bihari, DO as Consulting Physician (Family Medicine) No longer sees Dr. Dorisann Frames, Dr. Judi Saa or Dr. Tresa Endo  Depression Screening: Flowsheet Row Office Visit from 06/24/2021 in [facility]  PHQ-2 Total Score 0        Falls screen:     06/24/2021    1:51 PM 06/12/2020   10:50 AM 05/24/2019   10:24 AM 05/18/2018   10:35 AM 05/12/2017    9:23 AM  Fall Risk   Falls in the past year? 0 0 0 No No  Number falls in past yr: 0  0    Injury with Fall? 0  0    Risk for fall due to : No Fall Risks      Follow up Falls evaluation  completed         Functional Status Survey:        End of Life Discussion:  Patient has a living will and medical power of attorney.      PMH, PSH, SH and FH were reviewed and updated    ROS: The patient denies anorexia, fever, weight changes, headaches, vision changes, ear pain, sore throat, breast concerns, chest pain, palpitations, dizziness,  syncope, dyspnea on exertion, cough, swelling, nausea, vomiting, diarrhea, constipation, abdominal pain, melena, hematochezia, indigestion/heartburn, hematuria, dysuria, vaginal bleeding, discharge, odor or itch, genital lesions, numbness, tingling, weakness, tremor, suspicious skin lesions, depression, anxiety, abnormal bleeding/bruising, or enlarged lymph nodes.  Uses hearing aids.  Denies significant tinnitus or vertigo. Slight leakage of urine, urinary frequency, unchanged. Moods are good. Denies any change to fatty growth on the back of her neck. Denies any joint pains, neck pain.   PHYSICAL EXAM:  There were no vitals taken for this visit.  Wt Readings from Last 3 Encounters:  06/24/21 140 lb 12.8 oz (63.9 kg)  01/06/21 146 lb (66.2 kg)  06/12/20 146 lb 6.4 oz (66.4 kg)    General Appearance:   Alert, cooperative, no distress, appears stated age.  Head:   Normocephalic, without obvious abnormality, atraumatic    Eyes:   PERRL, conjunctiva/corneas clear, EOM's intact, fundi benign    Ears:   Normal TM's and external ear canals    Nose:   No drainage or sinus tenderness  Throat:   Normal mucosa  Neck:   Supple, no lymphadenopathy; thyroid: no enlargement/tenderness/nodules; no carotid bruit or JVD. c-spine nontender. There is a 3.5cm soft tissue mass at posterior neck.  Appears fairly central, slightly more to the right.. Nontender. No change from last year  Back:   Spine nontender, no curvature, ROM normal, no CVA tenderness    Lungs:   Clear to auscultation bilaterally without wheezes, rales or ronchi; respirations unlabored    Chest Wall:   No tenderness or deformity    Heart:   Regular rate and rhythm, S1 and S2 normal, no murmur, rub or gallop    Breast Exam:   No tenderness, masses, or nipple discharge or inversion. No axillary lymphadenopathy    Abdomen:   Soft, non-tender, nondistended, normoactive bowel sounds, no masses, no hepatosplenomegaly    Genitalia:   Exam declined  by patient  Rectal:   Exam declined by patient  Extremities:   No clubbing, cyanosis or edema.    Pulses:   2+ and symmetric all extremities    Skin:   Skin color, texture, turgor normal, no rashes or lesions. Many scattered SK's and angiomas   Lymph nodes:  Cervical, supraclavicular, and axillary nodes normal   Neurologic:   Normal strength, sensation and gait; reflexes 2-3+ and symmetric throughout                 Psych:  Normal mood, affect, hygiene and grooming  ***UPDATE POSTERIOR NECK MASS    Chemistry      Component Value Date/Time   NA 139 06/15/2022 0810   K 4.5 06/15/2022 0810   CL 103 06/15/2022 0810   CO2 22 06/15/2022 0810   BUN 9 06/15/2022 0810   CREATININE 0.70 06/15/2022 0810   CREATININE 0.66 05/05/2017 0745      Component Value Date/Time   CALCIUM 10.1 06/15/2022 0810   ALKPHOS 72 06/15/2022 0810   AST 23 06/15/2022 0810   ALT 21 06/15/2022 0810   BILITOT 0.4 06/15/2022 0810  Fasting glucose 91  Lab Results  Component Value Date   WBC 4.3 06/15/2022   HGB 13.4 06/15/2022   HCT 39.7 06/15/2022   MCV 92 06/15/2022   PLT 358 06/15/2022   Lab Results  Component Value Date   CHOL 170 06/15/2022   HDL 66 06/15/2022   LDLCALC 92 06/15/2022   TRIG 63 06/15/2022   CHOLHDL 2.6 06/15/2022   Lab Results  Component Value Date   TSH 2.320 06/15/2022    ASSESSMENT/PLAN:  RSV and TdaP from pharmacy. She prev declined pelvic (ok if that's what she prefers)  Still taking avapro?--last filled #30 in August per epic. Needs thyroid RF x 1 year.  Past due for mammo and DEXA (prev stated she would do mammo q2 years, due 07/2021)  Discussed monthly self breast exams and yearly mammograms (past due); at least 30 minutes of aerobic activity at least 5 days/week and weight-bearing exercise 2x/week; proper sunscreen use reviewed; healthy diet, including goals of calcium and vitamin D intake and alcohol recommendations (less than or equal to 1 drink/day)  reviewed; regular seatbelt use; changing batteries in smoke detectors.  Immunization recommendations discussed--recommended RSV vaccine from pharmacy, and TdaP from pharmacy. Colon cancer screening recommendations--she refuses to have another colonoscopy; Negative Cologuard 05/2017--patient declines repeat Cologuard. F/u DEXA was due 07/2021.   Pt is full code, full care. MOST form reviewed and updated  F/u  ?6 mos vs 1 year)    Medicare Attestation I have personally reviewed: The patient's medical and social history Their use of alcohol, tobacco or illicit drugs Their current medications and supplements The patient's functional ability including ADLs,fall risks, home safety risks, cognitive, and hearing and visual impairment Diet and physical activities Evidence for depression or mood disorders  The patient's weight, height, BMI have been recorded in the chart.  I have made referrals, counseling, and provided education to the patient based on review of the above and I have provided the patient with a written personalized care plan for preventive services.

## 2022-06-28 ENCOUNTER — Ambulatory Visit (INDEPENDENT_AMBULATORY_CARE_PROVIDER_SITE_OTHER): Payer: Medicare Other | Admitting: Family Medicine

## 2022-06-28 ENCOUNTER — Encounter: Payer: Self-pay | Admitting: Family Medicine

## 2022-06-28 VITALS — BP 144/84 | HR 88 | Ht 60.5 in | Wt 140.0 lb

## 2022-06-28 DIAGNOSIS — G4700 Insomnia, unspecified: Secondary | ICD-10-CM

## 2022-06-28 DIAGNOSIS — E7800 Pure hypercholesterolemia, unspecified: Secondary | ICD-10-CM

## 2022-06-28 DIAGNOSIS — M85852 Other specified disorders of bone density and structure, left thigh: Secondary | ICD-10-CM | POA: Diagnosis not present

## 2022-06-28 DIAGNOSIS — I10 Essential (primary) hypertension: Secondary | ICD-10-CM | POA: Diagnosis not present

## 2022-06-28 DIAGNOSIS — E039 Hypothyroidism, unspecified: Secondary | ICD-10-CM | POA: Diagnosis not present

## 2022-06-28 DIAGNOSIS — Z0000 Encounter for general adult medical examination without abnormal findings: Secondary | ICD-10-CM

## 2022-06-28 MED ORDER — IRBESARTAN 300 MG PO TABS: 300.0000 mg | ORAL_TABLET | Freq: Every day | ORAL | 3 refills | Status: DC

## 2022-06-29 ENCOUNTER — Telehealth: Payer: Self-pay | Admitting: Family Medicine

## 2022-06-29 NOTE — Telephone Encounter (Signed)
Pt called and states that she is unable to schedule a dexa scan until the authorization is in. She is going to [facility] at [facility]. Phone number is [phone]. Pt states she tried to schedule and they will not until this is done.

## 2022-06-29 NOTE — Telephone Encounter (Signed)
Called [facility] and got fax number and faxed orders for both for patient. Patient notified.

## 2022-07-07 ENCOUNTER — Encounter: Payer: Self-pay | Admitting: Family Medicine

## 2022-07-07 DIAGNOSIS — G4700 Insomnia, unspecified: Secondary | ICD-10-CM

## 2022-07-07 MED ORDER — QUETIAPINE FUMARATE 25 MG PO TABS: 25.0000 mg | ORAL_TABLET | Freq: Every day | ORAL | 1 refills | Status: DC

## 2022-07-07 MED ORDER — ALPRAZOLAM 0.25 MG PO TABS: 0.2500 mg | ORAL_TABLET | Freq: Three times a day (TID) | ORAL | 0 refills | Status: DC | PRN

## 2022-07-20 ENCOUNTER — Other Ambulatory Visit: Payer: Self-pay | Admitting: Family Medicine

## 2022-07-20 DIAGNOSIS — E039 Hypothyroidism, unspecified: Secondary | ICD-10-CM

## 2022-07-28 DIAGNOSIS — H26491 Other secondary cataract, right eye: Secondary | ICD-10-CM | POA: Diagnosis not present

## 2022-07-28 DIAGNOSIS — H52203 Unspecified astigmatism, bilateral: Secondary | ICD-10-CM | POA: Diagnosis not present

## 2022-07-29 ENCOUNTER — Other Ambulatory Visit: Payer: Self-pay | Admitting: Family Medicine

## 2022-07-29 DIAGNOSIS — E7800 Pure hypercholesterolemia, unspecified: Secondary | ICD-10-CM

## 2022-09-16 ENCOUNTER — Other Ambulatory Visit: Payer: Self-pay | Admitting: *Deleted

## 2022-09-16 ENCOUNTER — Telehealth: Payer: Self-pay | Admitting: *Deleted

## 2022-09-16 DIAGNOSIS — I10 Essential (primary) hypertension: Secondary | ICD-10-CM

## 2022-09-16 DIAGNOSIS — E7800 Pure hypercholesterolemia, unspecified: Secondary | ICD-10-CM

## 2022-09-16 DIAGNOSIS — G4700 Insomnia, unspecified: Secondary | ICD-10-CM

## 2022-09-16 MED ORDER — IRBESARTAN 300 MG PO TABS: 300.0000 mg | ORAL_TABLET | Freq: Every day | ORAL | 0 refills | Status: DC

## 2022-09-16 MED ORDER — ATORVASTATIN CALCIUM 20 MG PO TABS: 20.0000 mg | ORAL_TABLET | Freq: Every day | ORAL | 0 refills | Status: DC

## 2022-09-16 MED ORDER — QUETIAPINE FUMARATE 25 MG PO TABS: 25.0000 mg | ORAL_TABLET | Freq: Every day | ORAL | 0 refills | Status: DC

## 2022-09-16 NOTE — Telephone Encounter (Signed)
Yes, this is fine.

## 2022-09-16 NOTE — Telephone Encounter (Signed)
Patient called and as of the first of the year is switching from [facility] to [facility]. She would like lipitor, avapro and seroquel 90 day rx's sent to new pharmacy. Is this ok? I can send if so. Thanks.

## 2022-09-16 NOTE — Telephone Encounter (Signed)
Done

## 2022-09-21 DIAGNOSIS — H903 Sensorineural hearing loss, bilateral: Secondary | ICD-10-CM | POA: Diagnosis not present

## 2022-09-29 ENCOUNTER — Telehealth: Payer: Self-pay | Admitting: *Deleted

## 2022-09-29 DIAGNOSIS — M85852 Other specified disorders of bone density and structure, left thigh: Secondary | ICD-10-CM

## 2022-09-29 NOTE — Telephone Encounter (Signed)
I entered order for DEXA.  [facility]ise [facility] to [facility] and [facility]A (shouldn't need an order for mammo).

## 2022-09-29 NOTE — Telephone Encounter (Signed)
Patient advised.

## 2022-09-29 NOTE — Telephone Encounter (Signed)
Patient called and has new insurance for 2024, [employer]. She would like to go to [facility] for [facility] and mammogram this year. Asking if you would order these and she will call and schedule. She used to go to [place] but her new insurance will not cover there.

## 2022-09-30 ENCOUNTER — Other Ambulatory Visit: Payer: Self-pay | Admitting: Family Medicine

## 2022-09-30 DIAGNOSIS — Z1231 Encounter for screening mammogram for malignant neoplasm of breast: Secondary | ICD-10-CM

## 2022-10-04 ENCOUNTER — Encounter: Payer: Self-pay | Admitting: Family Medicine

## 2022-10-05 ENCOUNTER — Ambulatory Visit: Payer: Medicare Other

## 2022-10-18 ENCOUNTER — Encounter: Payer: Medicare Other | Admitting: Family Medicine

## 2022-10-24 NOTE — Progress Notes (Unsigned)
No chief complaint on file.  Patient presents to follow-up on hypertension. Her blood pressure was elevated at her November wellness visit, she was excitable during visit. She was asked to monitor her BP's, and f/u if they remains consistently elevated.  BP's have been running above goal (per message sent last month, running "between 137/69 and 154/74. One really high one was 178/87")  She is compliant with taking her Avapro 300mg  daily. Denies headaches, dizziness, chest pain, palpitations.  Denies edema; denies side effects.     PMH, PSH, SH reviewed   ROS:   PHYSICAL EXAM:  There were no vitals taken for this visit.  Wt Readings from Last 3 Encounters:  06/28/22 140 lb (63.5 kg)  06/24/21 140 lb 12.8 oz (63.9 kg)  01/06/21 146 lb (66.2 kg)      ASSESSMENT/PLAN:  Offer COVID booster (new recs)  ?add HCTZ and f/u in 3-4 weeks?

## 2022-10-25 ENCOUNTER — Ambulatory Visit (INDEPENDENT_AMBULATORY_CARE_PROVIDER_SITE_OTHER): Payer: Medicare HMO | Admitting: Family Medicine

## 2022-10-25 ENCOUNTER — Encounter: Payer: Self-pay | Admitting: Family Medicine

## 2022-10-25 VITALS — BP 130/80 | HR 80 | Ht 60.5 in | Wt 141.8 lb

## 2022-10-25 DIAGNOSIS — I10 Essential (primary) hypertension: Secondary | ICD-10-CM

## 2022-10-25 DIAGNOSIS — Z23 Encounter for immunization: Secondary | ICD-10-CM

## 2022-10-25 DIAGNOSIS — G4700 Insomnia, unspecified: Secondary | ICD-10-CM

## 2022-10-25 DIAGNOSIS — R350 Frequency of micturition: Secondary | ICD-10-CM

## 2022-10-25 NOTE — Patient Instructions (Addendum)
We are not changing your blood pressure medication today, due to the wide variations in the readings, some of which are very good (and more medication might make you feel dizzy).  Please always repeat your blood pressure after 5 minutes of sitting calmly if your initial blood pressure is high (over 150). Please put comments in the section on the blood pressure log that may explain why the reading is very good, or elevated (ie exercise, car accident, stress, upsetting phone call, more salt in your diet--ate out, soy sauce, etc).  Don't drink caffeine, including any caffeinated tea or soda. Limit your fluids after dinner--just 1 glass of water with dinner, and nothing later. You do need to drink plenty of water during the day, so drink it earlier, so that you are going to the bathroom during the day, rather than at night.  Please try taking 2 of your 25 mg seroquel at bedtime. This should help with sleep and anxiety. If it is helping, and you are tolerating this well, please call us to write you a new prescription for the 50mg  dose.

## 2022-11-01 ENCOUNTER — Encounter: Payer: Self-pay | Admitting: Family Medicine

## 2022-11-01 ENCOUNTER — Ambulatory Visit (INDEPENDENT_AMBULATORY_CARE_PROVIDER_SITE_OTHER): Payer: Medicare HMO | Admitting: Family Medicine

## 2022-11-01 VITALS — BP 126/80 | HR 80 | Ht 60.5 in | Wt 140.8 lb

## 2022-11-01 DIAGNOSIS — G4700 Insomnia, unspecified: Secondary | ICD-10-CM

## 2022-11-01 DIAGNOSIS — M7121 Synovial cyst of popliteal space [Baker], right knee: Secondary | ICD-10-CM

## 2022-11-01 MED ORDER — QUETIAPINE FUMARATE 50 MG PO TABS: 50.0000 mg | ORAL_TABLET | Freq: Every day | ORAL | 3 refills | Status: DC

## 2022-11-01 NOTE — Progress Notes (Signed)
Chief Complaint  Patient presents with   Leg Swelling    Swelling/puffiness behind her right knee that she noticed Saturday. Not hurting, just concerning. Wanting to make sure it's not injured. Also rx for seroquel was sent on 09/16/22, she never received so she wondered if maybe you would just send new rx for 50mg .     She noticed swelling behind the right knee. She first noticed this over the weekend.  She iced it Saturday afternoon, and also took 2 advil on Saturday. She didn't notice much change. Denies any injury or change in activity. She has known arthritis (and very little cartilage) on the left knee.  She googled it, and got scared about an aneurysm that could lead to a stroke. She has no pain associated with the swelling.  Since her last visit she increased her seroquel to 50mg , and she is sleeping much better. She denies feeling too tired the next morning. The 25mg  bottle she has is from December, never got the 25mg  RF ordered to [facility] in Feb. Requesting 50mg  rx to [facility].  PMH, PSH, SH reviewed  Outpatient Encounter Medications as of 11/01/2022  Medication Sig Note   atorvastatin (LIPITOR) 20 MG tablet Take 1 tablet (20 mg total) by mouth daily.    Calcium Carbonate-Vitamin D (CALTRATE 600+D PO) Take 1 tablet by mouth daily.    cholecalciferol (VITAMIN D) 1000 units tablet Take 1,000 Units by mouth daily.    irbesartan (AVAPRO) 300 MG tablet Take 1 tablet (300 mg total) by mouth daily.    Multiple Vitamins-Minerals (CENTRUM SILVER PO) Take 1 tablet by mouth daily.    QUEtiapine (SEROQUEL) 25 MG tablet Take 1 tablet (25 mg total) by mouth at bedtime. 11/01/2022: Taking 2 qhs   SYNTHROID 50 MCG tablet TAKE 1 TABLET DAILY BEFORE BREAKFAST FOR HYPOTHYROIDISM    ALPRAZolam (XANAX) 0.25 MG tablet Take 1 tablet (0.25 mg total) by mouth 3 (three) times daily as needed for anxiety. (Patient not taking: Reported on 10/25/2022) 11/01/2022: As needed   fexofenadine (ALLEGRA) 180 MG tablet  Take 180 mg by mouth daily. (Patient not taking: Reported on 06/28/2022) 10/25/2022: Will start soon   fluocinonide (LIDEX) 0.05 % gel Apply 1 application topically 4 (four) times daily. Reported on 12/12/2015 (Patient not taking: Reported on 06/24/2021) 05/12/2017: Uses prn for canker sores; much better since changer her drinking water   No facility-administered encounter medications on file as of 11/01/2022.   Allergies  Allergen Reactions   Amoxicillin Rash   Erythromycin Rash   Sulfa Antibiotics Rash   ROS:  no fever, chills, URI symptoms, chest pain, edema.  Swelling behind R knee, no knee pain, locking, giving way. No bleeding, bruising, rashes. Moods are good, sleep is improved.   PHYSICAL EXAM:  BP 126/80   Pulse 80   Ht 5' 0.5" (1.537 m)   Wt 140 lb 12.8 oz (63.9 kg)   BMI 27.05 kg/m   Wt Readings from Last 3 Encounters:  11/01/22 140 lb 12.8 oz (63.9 kg)  10/25/22 141 lb 12.8 oz (64.3 kg)  06/28/22 140 lb (63.5 kg)   Pleasant female, in good spirits, in no distress HEENT: conjunctiva and sclera are clear, EOMI. LE: WHSS on the left. R knee is slightly warmer than the left. Minimal effusion. Mild crepitus. Normal lachman/drawer, no pain with varus/valgus stress. Some discomfort with ROM (flexion/extension). +bulge palpated at R popliteal fossa, nontender. Calf nontender, no varicosities. No edema, normal pulses Psych: normal mood, affect, hygiene and grooming  Neuro: alert and oriented, cranial nerves grossly intact, normal gait   ASSESSMENT/PLAN:  Baker cyst, right - no associated pain.  Educated/discussed.  If painful, can start NSAIDs. All questions answered  Insomnia, unspecified type - improved with increase to 50mg  dose - Plan: QUEtiapine (SEROQUEL) 50 MG tablet

## 2022-11-01 NOTE — Patient Instructions (Addendum)
Since you aren't having any pain, we can just have you rest some (limit your exercise), keep the right leg elevated above the level of the heart when you're seated (if watching TV or reading). If the swelling gets worse, and/or you have associated pain, we can treat with a course of anti-inflammatories.  Just let us know.  Consider wearing a knee sleeve on the right knee when you exercise.  If your pain/swelling flares despite this, then you should back off on your exercise.

## 2022-11-04 ENCOUNTER — Encounter: Payer: Self-pay | Admitting: Family Medicine

## 2022-11-04 NOTE — Addendum Note (Signed)
Addended by: Rita Ohara on: 11/04/2022 11:50 AM   Modules accepted: Level of Service

## 2022-11-05 ENCOUNTER — Other Ambulatory Visit: Payer: Medicare Other

## 2022-11-18 ENCOUNTER — Encounter: Payer: Self-pay | Admitting: Family Medicine

## 2022-11-18 ENCOUNTER — Ambulatory Visit (INDEPENDENT_AMBULATORY_CARE_PROVIDER_SITE_OTHER): Payer: Medicare HMO

## 2022-11-18 ENCOUNTER — Ambulatory Visit: Payer: Medicare HMO | Admitting: Family Medicine

## 2022-11-18 ENCOUNTER — Other Ambulatory Visit: Payer: Self-pay

## 2022-11-18 VITALS — BP 212/96 | HR 92 | Ht 60.0 in | Wt 143.0 lb

## 2022-11-18 DIAGNOSIS — G8929 Other chronic pain: Secondary | ICD-10-CM | POA: Diagnosis not present

## 2022-11-18 DIAGNOSIS — M25561 Pain in right knee: Secondary | ICD-10-CM | POA: Diagnosis not present

## 2022-11-18 DIAGNOSIS — M11261 Other chondrocalcinosis, right knee: Secondary | ICD-10-CM | POA: Diagnosis not present

## 2022-11-18 DIAGNOSIS — M1711 Unilateral primary osteoarthritis, right knee: Secondary | ICD-10-CM | POA: Diagnosis not present

## 2022-11-18 NOTE — Progress Notes (Signed)
   [name], PhD, LAT, ATC acting as a scribe for [name], MD.  Subjective:    CC: Right knee pain  HPI: Patient is an 82 year old female presenting with right knee pain ongoing since mid March.  No injury.  Patient locates pain to the posterior aspect of her R knee, w/ obvious bulge in the popliteal fossa. Pain is now moving slightly distally into the R calf. She reports the pain isn't terribly bad, but that she is very aware of the swelling.   R Knee swelling: yes- posteriorly Mechanical symptoms: yes Aggravates: nothing in particular Treatments tried: ice, IBU  Pertinent review of Systems: No fevers or chills  Relevant historical information: Hypertension.  Knee arthritis history.   Objective:    Vitals:   11/18/22 1428  BP: (!) 212/96  Pulse: 92  SpO2: (!) 86%   General: Well Developed, well nourished, and in no acute distress.   MSK: Right knee mild effusion otherwise normal. Normal motion. Nontender. Stable ligamentous exam. Intact strength.  Lab and Radiology Results  Diagnostic Limited MSK Ultrasound of: Right knee Quad tendon intact normal-appearing Mild joint effusion is present. Patellar tendon normal appearing Posterior knee small to tiny Baker's cyst is seen with some fluid collecting superficially in the posterior calf.  This is consistent with a ruptured/drained Baker's cyst. Impression: Right knee Baker's cyst likely status post rupture or drain.  X-ray images right knee obtained today personally and independently interpreted Medial compartment DJD.  Patellofemoral DJD.  Calcifications along superior patellar pole.  No acute fracture. Await for radiology review  Impression and Recommendations:    Assessment and Plan: 82 y.o. female with right knee posterior knee pain and swelling.  Concerning for Baker's cyst.  It sounds like her cyst was larger in appearance when she was seen by her primary care provider recently.  I think what  happened was the Baker's cyst drained or leaked into the posterior calf in the interim.  Today the cyst does not big enough to aspirate and she is not having enough pain dysfunction currently to proceed with an interarticular steroid injection.  In the meantime we will use compression sleeve and Voltaren gel.Marland Kitchen  PDMP not reviewed this encounter. Orders Placed This Encounter  Procedures   [place] LIMITED JOINT SPACE STRUCTURES LOW RIGHT(NO LINKED CHARGES)    Order Specific Question:   Reason for Exam (SYMPTOM  OR DIAGNOSIS REQUIRED)    Answer:   right knee pain    Order Specific Question:   Preferred imaging location?    Answer:   [facility] Knee AP/LAT W/[place] Right    Standing Status:   Future    Number of Occurrences:   1    Standing Expiration Date:   11/18/2023    Order Specific Question:   Reason for Exam (SYMPTOM  OR DIAGNOSIS REQUIRED)    Answer:   eval knee pain    Order Specific Question:   Preferred imaging location?    Answer:   [facility]   No orders of the defined types were placed in this encounter.   Discussed warning signs or symptoms. Please see discharge instructions. Patient expresses understanding.   The above documentation has been reviewed and is accurate and complete [name], M.D.

## 2022-11-18 NOTE — Patient Instructions (Addendum)
Thank you for coming in today.   Please get an Xray today before you leave   Continue compression with activity.   Please use Voltaren gel (Generic Diclofenac Gel) up to 4x daily for pain as needed.  This is available over-the-counter as both the name brand Voltaren gel and the generic diclofenac gel.

## 2022-11-19 NOTE — Progress Notes (Signed)
Right knee x-ray shows some arthritis changes.

## 2022-11-25 ENCOUNTER — Encounter: Payer: Self-pay | Admitting: Family Medicine

## 2022-11-25 NOTE — Telephone Encounter (Signed)
Called to clarify what pt was referring to in her message. She saw the [place] note in her MyChart and was confused. I clarified the findings were what she and Dr. Denyse Amass discussed at the time of her visit. She reported that her knee was continuing to improve. She verbalized understanding.

## 2022-11-25 NOTE — Telephone Encounter (Signed)
Called to clarify what pt was referring to in her message. She saw the [place] note in her MyChart and was confused. I clarified the findings were what she and Dr. Denyse Amass discussed at the time of her visit. She reported that her knee was continuing to improve. She verbalized understanding.     Repeat message

## 2022-12-15 ENCOUNTER — Ambulatory Visit (INDEPENDENT_AMBULATORY_CARE_PROVIDER_SITE_OTHER): Payer: Medicare HMO | Admitting: Family Medicine

## 2022-12-15 ENCOUNTER — Encounter: Payer: Self-pay | Admitting: Family Medicine

## 2022-12-15 VITALS — BP 150/100 | HR 80 | Ht 60.5 in | Wt 145.0 lb

## 2022-12-15 DIAGNOSIS — I10 Essential (primary) hypertension: Secondary | ICD-10-CM | POA: Diagnosis not present

## 2022-12-15 DIAGNOSIS — L988 Other specified disorders of the skin and subcutaneous tissue: Secondary | ICD-10-CM

## 2022-12-15 NOTE — Patient Instructions (Signed)
  The spot on your chest looks like it could be from eczema (nummular eczema), possibly an early psoriasis--that is usually more raised/thickened, which your isn't. The treatment for eczema would be a topical steroid cream and keeping it well moisturized. I recommend trying an over-the-counter 1% hydrocortisone cream, applied 2-3 times daily. It doesn't have the central clearing that we would typically see with ringworm. If it gets larger, and the center gets clearer (lighter in color, back to normal skin color), that could indicate ringworm, and you should stop the hydrocortisone, and start an antifungal cream like lotrimin or lamisil.  The other possibility we discussed is that this could be the "herald patch" of pityriasis rosea.  If this is the case, you may end up with a more diffuse rash across your entire trunk (chest, abdomen and back). There is no specific treatment for this, just treating the symptoms (ie if it is itchy).

## 2022-12-15 NOTE — Progress Notes (Signed)
Chief Complaint  Patient presents with   Consult    Red round spot on right breast that she noticed last week. Not painful or itchy, is smooth.    Patient presents with complaint of a red circular area on the right inner breast that she first noted about a week ago.  It may have gotten slightly larger since she first noticed it. It is not itchy or painful. She has not used any new products/adhesives, etc in this area.  She has not pets.  She reports family h/o psoriasis, and that she has had some spots of psoriasis in the past, none recently.  They appear similar to prior psoriasis, though she recalls those being itchy in the past.  Denies other skin concerns.  She reports she has another visit for later this month to follow up on her blood pressure. Reports that the lump behind the knee hasn't changed much, isn't painful, had MRI, and that it is too small to aspirate.    PMH, PSH, SH reviewed  Outpatient Encounter Medications as of 12/15/2022  Medication Sig Note   atorvastatin (LIPITOR) 20 MG tablet Take 1 tablet (20 mg total) by mouth daily.    Calcium Carbonate-Vitamin D (CALTRATE 600+D PO) Take 1 tablet by mouth daily.    cholecalciferol (VITAMIN D) 1000 units tablet Take 1,000 Units by mouth daily.    irbesartan (AVAPRO) 300 MG tablet Take 1 tablet (300 mg total) by mouth daily.    Multiple Vitamins-Minerals (CENTRUM SILVER PO) Take 1 tablet by mouth daily.    QUEtiapine (SEROQUEL) 50 MG tablet Take 1 tablet (50 mg total) by mouth at bedtime.    SYNTHROID 50 MCG tablet TAKE 1 TABLET DAILY BEFORE BREAKFAST FOR HYPOTHYROIDISM    ALPRAZolam (XANAX) 0.25 MG tablet Take 1 tablet (0.25 mg total) by mouth 3 (three) times daily as needed for anxiety. (Patient not taking: Reported on 10/25/2022) 12/15/2022: As needed, does not have any more   fexofenadine (ALLEGRA) 180 MG tablet Take 180 mg by mouth daily. (Patient not taking: Reported on 06/28/2022) 12/15/2022: Has not needed   fluocinonide  (LIDEX) 0.05 % gel Apply 1 application topically 4 (four) times daily. Reported on 12/12/2015 (Patient not taking: Reported on 06/24/2021) 05/12/2017: Uses prn for canker sores; much better since changer her drinking water   No facility-administered encounter medications on file as of 12/15/2022.   Allergies  Allergen Reactions   Amoxicillin Rash   Erythromycin Rash   Sulfa Antibiotics Rash   ROS: no fever, chills, URI symptoms.  Knee swelling unchanged.  No bleeding, bruising, other skin concerns except for the one area on the right breast.  No headaches, dizziness, chest pain.   PHYSICAL EXAM:  BP (!) 150/100   Pulse 80   Ht 5' 0.5" (1.537 m)   Wt 145 lb (65.8 kg)   BMI 27.85 kg/m  Anxious about lesion on her breast  Well-appearing female, excitable and somewhat anxious today. HEENT: conjunctiva and sclera are clear, EOMI Skin:  2 x 1.5 cm area of erythema, minimally raised, slightly dry/flaky.  No central clearing. Remainder of skin is clear (some diffuse SK's, some of which are large, no other erythematous lesions) Neuro: alert and oriented, cranial nerves grossly intact, normal gait   ASSESSMENT/PLAN:  Skin lesion of breast - Ddx reviewed. suspect nummular eczema, can't r/o herald patch of pityriasis rosea. doubt ringworm (no central clearing).  HC cream bid-tid, f/u if worse  Essential hypertension, benign - BP elevated today, but anxious.  Has appt later this month to review BP's. To continue monitoring at home and bring list to f/u    The spot on your chest looks like it could be from eczema (nummular eczema), possibly an early psoriasis--that is usually more raised/thickened, which your isn't. The treatment for eczema would be a topical steroid cream and keeping it well moisturized. I recommend trying an over-the-counter 1% hydrocortisone cream, applied 2-3 times daily. It doesn't have the central clearing that we would typically see with ringworm. If it gets larger,  and the center gets clearer (lighter in color, back to normal skin color), that could indicate ringworm, and you should stop the hydrocortisone, and start an antifungal cream like lotrimin or lamisil.  The other possibility we discussed is that this could be the "herald patch" of pityriasis rosea.  If this is the case, you may end up with a more diffuse rash across your entire trunk (chest, abdomen and back). There is no specific treatment for this, just treating the symptoms (ie if it is itchy).

## 2022-12-16 ENCOUNTER — Ambulatory Visit: Payer: Medicare HMO | Admitting: Family Medicine

## 2022-12-26 NOTE — Progress Notes (Unsigned)
No chief complaint on file.  Patient presents for follow-up. She was seen earlier this month with a skin lesion on the right inner breast.  She used hydrocortisone cream  UPDATE   HTN:  When seen in March, she had a lot of variability in her blood pressures. It was normal at the visit (126/80), but often higher at home.  We had discussed possible contributing factors to higher blood pressure (anxiety, poor sleep, sodium, etc), reviewed proper technique for checking BP, and was going to observe on her 300mg  of avapro for a little longer, while monitoring more closely.  Today she reports that BP's have been running   BP Readings from Last 3 Encounters:  12/15/22 (!) 150/100  11/18/22 (!) 212/96  11/01/22 126/80   She denies headaches, dizziness, chest pain, edema.  Insomnia:  In March she also reported having more trouble sleeping.  Sleep was interrupted to void at least 3x/night.  No difference in her sleep whether she exercised that day or not.  Reading helps her relax. Previously tried escitalopram (thinking anxiety was contributing to her trouble sleeping), but didn't help. She increased seroquel to 50mg  each night (from 25mg ) in March.  UPDATE  We had discussed limiting fluids at night, and caffeine, to help with her urinary frequency.  Hyperlipidemia follow-up: Patient is reportedly following a low-fat, low cholesterol diet. Compliant with medications (atorvastatin 20mg ) and denies medication side effects.  Last lipids were at goal. Lab Results  Component Value Date   CHOL 170 06/15/2022   HDL 66 06/15/2022   LDLCALC 92 06/15/2022   TRIG 63 06/15/2022   CHOLHDL 2.6 06/15/2022     Hypothyroidism: She denies fatigue, hair, skin, bowel changes. Denies any mood changes. Denies dysphagia or any thyroid concerns. Takes Synthroid 50 mcg on empty stomach, separate from food and other medications. She takes MVI and calcium at lunch. Lab Results  Component Value Date   TSH 2.320  06/15/2022      Osteopenia:   Takes Calcium with D once daily, gets regular exercise, including weights (with silver sneaker classes)  She previously reported that she took Actonel for 10 years, stopped it on her own, concerned about risks.  She had repeat DEXA in 07/2019 which showed stable osteopenia. She was due for 2 year repeat DEXA, but didn't get this last year. This is scheduled for July 2024. She still goes to [employer] 3x/week (plus 1 dance class).    PMH, PSH, SH reviewed    ROS:  no fever, chills, headaches, dizziness, chest pain, shortness of breath, edema. No n/v/d, abdominal pain. Urinary frequency per HPI.  No dysuria, hematuria. Moods Insomnia Rash/lesion R breast    PHYSICAL EXAM:  There were no vitals taken for this visit.  Wt Readings from Last 3 Encounters:  12/15/22 145 lb (65.8 kg)  11/18/22 143 lb (64.9 kg)  11/01/22 140 lb 12.8 oz (63.9 kg)   Talkative, excitable female, in no distress. HEENT: conjunctiva and sclera area clear, EOMI Neck: no lymphadenopathy, thyromegaly or mass Heart: regular rate and rhythm Lungs: clear bilaterally Back: no CVA tenderness Abdomen: soft, nontender, no mass Extremities: No edema, normal pulses Psych: *** Neuro: alert and oriented, cranial nerves grossly intact, normal gait. Skin:  R breast--UPDATE ***   ASSESSMENT/PLAN:  RF lipitor, avapro x 6 mos each    Consider myrbetriq?  F/u as scheduled in November for physical.  Fasting, or labs prior?

## 2022-12-27 ENCOUNTER — Ambulatory Visit (INDEPENDENT_AMBULATORY_CARE_PROVIDER_SITE_OTHER): Payer: Medicare HMO | Admitting: Family Medicine

## 2022-12-27 ENCOUNTER — Encounter: Payer: Self-pay | Admitting: Family Medicine

## 2022-12-27 VITALS — BP 150/82 | HR 88 | Ht 60.0 in | Wt 145.6 lb

## 2022-12-27 DIAGNOSIS — I10 Essential (primary) hypertension: Secondary | ICD-10-CM | POA: Diagnosis not present

## 2022-12-27 DIAGNOSIS — N3281 Overactive bladder: Secondary | ICD-10-CM | POA: Diagnosis not present

## 2022-12-27 DIAGNOSIS — G4700 Insomnia, unspecified: Secondary | ICD-10-CM | POA: Diagnosis not present

## 2022-12-27 DIAGNOSIS — M1711 Unilateral primary osteoarthritis, right knee: Secondary | ICD-10-CM | POA: Insufficient documentation

## 2022-12-27 DIAGNOSIS — M85852 Other specified disorders of bone density and structure, left thigh: Secondary | ICD-10-CM | POA: Diagnosis not present

## 2022-12-27 DIAGNOSIS — E7800 Pure hypercholesterolemia, unspecified: Secondary | ICD-10-CM

## 2022-12-27 DIAGNOSIS — E039 Hypothyroidism, unspecified: Secondary | ICD-10-CM

## 2022-12-27 MED ORDER — AMLODIPINE BESYLATE 2.5 MG PO TABS: 2.5000 mg | ORAL_TABLET | Freq: Every day | ORAL | 1 refills | Status: DC

## 2022-12-27 MED ORDER — MIRABEGRON ER 25 MG PO TB24: 25.0000 mg | ORAL_TABLET | Freq: Every day | ORAL | 0 refills | Status: DC

## 2022-12-27 NOTE — Assessment & Plan Note (Signed)
Suboptimally controlled on avapro 300mg .  Add 2.5 mg amlodipine (avoiding HCTZ due to already having significant urinary frequency).  Risks/SE reviewed. To continue to monitor BP's regularly (monitor verified as accurate today).  F/u in 4-6 weeks, sooner prn

## 2022-12-27 NOTE — Assessment & Plan Note (Signed)
Not improved with behavior measures (counseled in the past). Pt and I have concerns re: risks/SE of anticholinergic meds given her age. Myrbetriq would be safer medication to try, unsure if insurance will approve (or if she will have to try/fail anticholinergics).  Will try Myrbetriq 25mg , and to increase to 50mg  after 2 weeks if no response and no SE.

## 2022-12-27 NOTE — Patient Instructions (Signed)
We are adding a low dose amlodipine to your Avapro.   Continue to monitor your blood pressure and limit your sodium. It can take up to 2 weeks to see the full effect of the new medication. If your blood pressure remains over 130/80, then we can discuss increasing the dose at your next visit.  We are sending in a prescription for a bladder medication to help your overactive bladder. After 2 weeks, you can double the dose if the lower dose isn't effective If you insurance doesn't cover Myrbetriq, then we may need to try a different medication.  That medication may cause more dry mouth, or constipation. If you cannot tolerate it, please let us know.  Follow up with the sports med doctor for your worsening knee pain.

## 2022-12-27 NOTE — Assessment & Plan Note (Signed)
Adequately replaced per last check in November; clinically euthyroid. Continue Synthroid 50 mcg daily

## 2022-12-27 NOTE — Assessment & Plan Note (Signed)
Recommended pt schedule f/u appt with Dr. Denyse Amass for further evaluation and treatment. She is having more pain with weight-bearing activity, and ongoing swelling.

## 2022-12-27 NOTE — Assessment & Plan Note (Signed)
Controlled with atorvastatin per last labs. Continue med and low cholesterol diet

## 2022-12-27 NOTE — Assessment & Plan Note (Signed)
Cont Ca, D, weight-bearing exercise.  DEXA is scheduled for July

## 2022-12-27 NOTE — Assessment & Plan Note (Signed)
Improved with increasing seroquel to 50mg . Continue

## 2022-12-28 ENCOUNTER — Ambulatory Visit: Payer: Medicare HMO | Admitting: Family Medicine

## 2022-12-28 ENCOUNTER — Other Ambulatory Visit: Payer: Self-pay

## 2022-12-28 ENCOUNTER — Encounter: Payer: Self-pay | Admitting: Family Medicine

## 2022-12-28 VITALS — BP 180/84 | HR 97 | Ht 60.0 in | Wt 146.0 lb

## 2022-12-28 DIAGNOSIS — M25561 Pain in right knee: Secondary | ICD-10-CM | POA: Diagnosis not present

## 2022-12-28 DIAGNOSIS — G8929 Other chronic pain: Secondary | ICD-10-CM | POA: Diagnosis not present

## 2022-12-28 DIAGNOSIS — M7121 Synovial cyst of popliteal space [Baker], right knee: Secondary | ICD-10-CM | POA: Diagnosis not present

## 2022-12-28 NOTE — Progress Notes (Unsigned)
[name], PhD, LAT, ATC acting as a scribe for [name], MD.  Lisa [name] is a 82 y.o. female who presents to [facility] at [facility] today for worsening R knee pain. Pt was last seen by Dr. Denyse Amass on 11/18/22 and was advised to use a compression sleeve and Voltaren gel. Today, pt reports she has been wearing the compression knee sleeve. She really enjoys doing her silver sneakers and dance classes. R knee pain really worsened on Sunday evening.   Dx imaging: 11/18/22 R knee XR  Pertinent review of systems: No fevers or chills  Relevant historical information: Hypertension   Exam:  BP (!) 180/84   Pulse 97   Ht 5' (1.524 m)   Wt 146 lb (66.2 kg)   SpO2 98%   BMI 28.51 kg/m  General: Well Developed, well nourished, and in no acute distress.   MSK: Right knee swelling present at posterior medial knee and a small joint effusion is present.  Otherwise normal-appearing Normal knee motion some pain with full extension. Stable ligamentous exam. Intact strength.   Lab and Radiology Results  Procedure: Real-time Ultrasound Guided aspiration and injection right knee Baker's cyst. Device: Philips Affiniti 50G Images permanently stored and available for review in PACS Verbal informed consent obtained.  Discussed risks and benefits of procedure. Warned about infection, bleeding, hyperglycemia damage to structures among others. Patient expresses understanding and agreement Time-out conducted.   Noted no overlying erythema, induration, or other signs of local infection.   Skin prepped in a sterile fashion.   Local anesthesia: Topical Ethyl chloride.   With sterile technique and under real time ultrasound guidance: 2 mL of lidocaine injected into subcutaneous tissue overlying Baker's cyst achieving good anesthesia. Skin sterilized with isopropyl alcohol. 18-gauge needle used to access the cyst and 10 mL of clear straw-colored fluid were aspirated.  Cyst  was seen to be decompressed following aspiration. Syringe was exchanged and 40 mg of Kenalog and 2 m Marcaine were injected into the cystic structure. Completed without difficulty   Pain immediately resolved suggesting accurate placement of the medication.   Advised to call if fevers/chills, erythema, induration, drainage, or persistent bleeding.   Images permanently stored and available for review in the ultrasound unit.  Impression: Technically successful ultrasound guided injection.   EXAM: RIGHT KNEE 3 VIEWS   COMPARISON:  None Available.   FINDINGS: Chondrocalcinosis. No fractures. Mild degenerative change in the patellofemoral compartment. No joint effusion. Soft tissue calcification extends superior to the posterior aspect of the patella on the lateral view. This may be laterally located based on the sunrise view. No other bony or soft tissue abnormalities are identified.   IMPRESSION: 1. Chondrocalcinosis consistent with CPPD. 2. Mild degenerative change in the patellofemoral compartment. 3. Soft tissue calcification extends superior to the posterior aspect of the patella on the lateral view. This may be laterally located based on the sunrise view. This finding is nonspecific.     Electronically Signed   By: Gerome Sam III M.D.   On: 11/19/2022 12:41 I, [name], personally (independently) visualized and performed the interpretation of the images attached in this note.       Assessment and Plan: 82 y.o. female with right knee pain thought to be due to DJD and Baker's cyst.  Plan for aspiration injection of Baker's cyst today.  She did have immediate results.  If this pain relief is not an we could attempt aspiration and injection or just basic  intra-articular injection of the main knee joint.  That could be helpful as well.  She will let me know how she feels.  Check back as needed.   PDMP not reviewed this encounter. Orders Placed This Encounter   Procedures   [place] LIMITED JOINT SPACE STRUCTURES LOW RIGHT(NO LINKED CHARGES)    Order Specific Question:   Reason for Exam (SYMPTOM  OR DIAGNOSIS REQUIRED)    Answer:   right knee pain    Order Specific Question:   Preferred imaging location?    Answer:   [facility]   No orders of the defined types were placed in this encounter.    Discussed warning signs or symptoms. Please see discharge instructions. Patient expresses understanding.   The above documentation has been reviewed and is accurate and complete [name], M.D.

## 2022-12-28 NOTE — Patient Instructions (Addendum)
Thank you for coming in today.   You received an injection today. Seek immediate medical attention if the joint becomes red, extremely painful, or is oozing fluid.   Let me know how this goes.   I can do a different injection into the knee if needed.

## 2023-01-08 ENCOUNTER — Encounter: Payer: Self-pay | Admitting: Family Medicine

## 2023-01-09 ENCOUNTER — Other Ambulatory Visit: Payer: Self-pay | Admitting: Family Medicine

## 2023-01-09 DIAGNOSIS — E7800 Pure hypercholesterolemia, unspecified: Secondary | ICD-10-CM

## 2023-01-10 ENCOUNTER — Other Ambulatory Visit: Payer: Self-pay | Admitting: *Deleted

## 2023-01-10 DIAGNOSIS — E7800 Pure hypercholesterolemia, unspecified: Secondary | ICD-10-CM

## 2023-01-10 MED ORDER — ATORVASTATIN CALCIUM 20 MG PO TABS
20.0000 mg | ORAL_TABLET | Freq: Every day | ORAL | 0 refills | Status: DC
Start: 2023-01-10 — End: 2023-09-28

## 2023-01-12 ENCOUNTER — Encounter: Payer: Self-pay | Admitting: Family Medicine

## 2023-01-27 ENCOUNTER — Ambulatory Visit: Payer: Medicare HMO | Admitting: Family Medicine

## 2023-01-28 ENCOUNTER — Other Ambulatory Visit: Payer: Self-pay

## 2023-01-28 ENCOUNTER — Ambulatory Visit: Payer: Medicare HMO | Admitting: Family Medicine

## 2023-01-28 VITALS — BP 182/84 | HR 89 | Ht 60.0 in | Wt 148.0 lb

## 2023-01-28 DIAGNOSIS — G8929 Other chronic pain: Secondary | ICD-10-CM | POA: Diagnosis not present

## 2023-01-28 DIAGNOSIS — M25561 Pain in right knee: Secondary | ICD-10-CM

## 2023-01-28 MED ORDER — COLCHICINE 0.6 MG PO TABS: 0.6000 mg | ORAL_TABLET | Freq: Every day | ORAL | 2 refills | Status: AC | PRN

## 2023-01-28 NOTE — Progress Notes (Signed)
Lisa Payor, PhD, LAT, ATC acting as a scribe for [name], MD.  Lisa [name] is a 82 y.o. female who presents to [facility] at [facility] today for exacerbation of her R knee pain. Pt was last seen by Dr. Denyse Amass on 12/28/22 and the Baker's cyst in her R knee was aspirated and injected. Today, pt reports R knee pain was feeling great after the aspiration, but now has worsened over the last week or so. She feels like the Baker's cyst feels larger than previously. R knee pain really worsened last week when she was vacationing at [facility].  Dx imaging: 11/18/22 R knee XR   Pertinent review of systems: No fevers or chills  Relevant historical information: Right knee Baker's cyst and chondrocalcinosis concerning for pseudogout.   Exam:  BP (!) 182/84   Pulse 89   Ht 5' (1.524 m)   Wt 148 lb (67.1 kg)   SpO2 98%   BMI 28.90 kg/m  General: Well Developed, well nourished, and in no acute distress.   MSK: Right knee joint moderate effusion and a visible and palpable Baker's cyst.  Decreased range of motion.  Intact strength.    Lab and Radiology Results  Procedure: Real-time Ultrasound Guided aspiration and injection of right knee joint effusion Device: Philips Affiniti 50G Images permanently stored and available for review in PACS Verbal informed consent obtained.  Discussed risks and benefits of procedure. Warned about infection, bleeding, hyperglycemia damage to structures among others. Patient expresses understanding and agreement Time-out conducted.   Noted no overlying erythema, induration, or other signs of local infection.   Skin prepped in a sterile fashion.   Local anesthesia: Topical Ethyl chloride.   With sterile technique and under real time ultrasound guidance: 2 mL of lidocaine injected into the superior lateral subcutaneous tissue achieving good anesthesia. Skin was again anesthetized with isopropyl alcohol and 18-gauge needle was used  to access the knee joint at the superior lateral space. 10 mL of clear yellow fluid was aspirated. Syringe was exchanged and 40 mg of Kenalog and 2 mL of Marcaine were injected to the knee joint. Completed without difficulty   Pain moderately resolved suggesting accurate placement of the medication.   Advised to call if fevers/chills, erythema, induration, drainage, or persistent bleeding.   Images permanently stored and available for review in the ultrasound unit.  Impression: Technically successful ultrasound guided injection.   EXAM: RIGHT KNEE 3 VIEWS   COMPARISON:  None Available.   FINDINGS: Chondrocalcinosis. No fractures. Mild degenerative change in the patellofemoral compartment. No joint effusion. Soft tissue calcification extends superior to the posterior aspect of the patella on the lateral view. This may be laterally located based on the sunrise view. No other bony or soft tissue abnormalities are identified.   IMPRESSION: 1. Chondrocalcinosis consistent with CPPD. 2. Mild degenerative change in the patellofemoral compartment. 3. Soft tissue calcification extends superior to the posterior aspect of the patella on the lateral view. This may be laterally located based on the sunrise view. This finding is nonspecific.     Electronically Signed   By: Gerome Sam III M.D.   On: 11/19/2022 12:41 I, [name], personally (independently) visualized and performed the interpretation of the images attached in this note.     Assessment and Plan: 82 y.o. female with right knee pain and swelling.  Patient has recurrent Baker's cyst and a recurrent joint effusion.  She had aspiration and injection of the Baker's cyst  last month.  Her symptoms have returned.  The aspirate and inject the main knee joint effusion as that may be helpful.  Will send this fluid off for fluid analysis and crystal analysis looking for pseudogout.  I have prescribed colchicine to use as needed as  that may be helpful for pseudogout pain and swelling.   PDMP not reviewed this encounter. Orders Placed This Encounter  Procedures   [place] LIMITED JOINT SPACE STRUCTURES LOW RIGHT(NO LINKED CHARGES)    Order Specific Question:   Reason for Exam (SYMPTOM  OR DIAGNOSIS REQUIRED)    Answer:   right knee pain    Order Specific Question:   Preferred imaging location?    Answer:   [facility]   Synovial Fluid Analysis, Complete    Synovial fluid    Standing Status:   Future    Number of Occurrences:   1    Standing Expiration Date:   01/28/2024   Meds ordered this encounter  Medications   colchicine 0.6 MG tablet    Sig: Take 1 tablet (0.6 mg total) by mouth daily as needed (gout or psuedogout pain).    Dispense:  30 tablet    Refill:  2     Discussed warning signs or symptoms. Please see discharge instructions. Patient expresses understanding.   The above documentation has been reviewed and is accurate and complete [name], M.D.

## 2023-01-28 NOTE — Patient Instructions (Addendum)
Thank you for coming in today.   You received an injection today. Seek immediate medical attention if the joint becomes red, extremely painful, or is oozing fluid.   Ok to use colchicine daily as needed for pseudogout pain and swelling.   Let me know if this is not working.

## 2023-01-29 ENCOUNTER — Encounter: Payer: Self-pay | Admitting: Family Medicine

## 2023-01-29 LAB — SYNOVIAL FLUID ANALYSIS, COMPLETE
Basophils, %: 0 %
Eosinophils-Synovial: 2 % (ref 0–2)
Lymphocytes-Synovial Fld: 22 % (ref 0–74)
Monocyte/Macrophage: 32 % (ref 0–69)
Neutrophil, Synovial: 44 % — ABNORMAL HIGH (ref 0–24)
Synoviocytes, %: 0 % (ref 0–15)
WBC, Synovial: 424 cells/uL — ABNORMAL HIGH (ref <150)

## 2023-01-31 ENCOUNTER — Encounter: Payer: Self-pay | Admitting: Family Medicine

## 2023-01-31 DIAGNOSIS — M11269 Other chondrocalcinosis, unspecified knee: Secondary | ICD-10-CM | POA: Insufficient documentation

## 2023-01-31 NOTE — Progress Notes (Signed)
Knee aspiration is reassuring.  No infection. You do have pseudogout on the knee aspiration.  That is probably the source of your knee pain.

## 2023-02-01 ENCOUNTER — Encounter: Payer: Self-pay | Admitting: Family Medicine

## 2023-02-01 NOTE — Telephone Encounter (Signed)
Per lab results note 01/28/23 -   You do have pseudogout on the knee aspiration.  That is probably the source of your knee pain.

## 2023-02-03 NOTE — Progress Notes (Signed)
Chief Complaint  Patient presents with   Hypertension    Med check for bp. She brought a list and she said it has not been great. Myrbetriq did not work that well, finished one month trial.   She notes that the red spot on her right breast has gotten deeper red and a little larger. She has been using cortisone cream twice daily since first evaluated for this on 5/8. She denies any itching.   Hypertension follow-up:  BP was suboptimally controlled on Avapro 300mg . Last month we added 2.5 mg amlodipine (avoiding HCTZ due to already having significant urinary frequency) to Avapro. She is tolerating the amlodipine without side effects. She takes this at lunch, and the Avapro after breakfast.  She finds it easy to remember the amlodipine at lunch, as that is when she takes her vitamins. BP's have been running 121-158/67-89.  Mostly 135-150/75. BP had been much higher related to pain from the knee. Currently she denies any significant pain. Monitor was verified as accurate at her last visit.  BP Readings from Last 3 Encounters:  02/07/23 (!) 140/80  01/28/23 (!) 182/84  12/28/22 (!) 180/84    She denies headaches, dizziness, chest pain, edema.   R knee pain: She is under the care of Dr. Denyse Amass. She was diagnosed with pseudogout, confirmed with aspiration (calcium pyrophosphate crystals).  Colchicine has been effective. She has been able to do her silver sneakers classes--went today--and denies pain. She hasn't noted any significant change in the swelling.   Insomnia: Sleep improved after increasing seroquel to 50 mg at bedtime. Her interruptions are mainly related to bathroom trips. Still getting up 3x/night.   Urinary frequency/OAB--She was given a trial of Myrbetriq.  She didn't note significant improvement, and it was not affordable. She has frequency during the day, and is up 3x/night. Denies dysuria.   Hypothyroidism:  She is compliant with taking Synthroid on an empty stomach, first thing  in the morning, separate from her other medications. Takes her vitamins at lunch. Denies any changes to hair/skin/energy/bowels. Lab Results  Component Value Date   TSH 2.320 06/15/2022    PMH, PSH, SH reviewed  Outpatient Encounter Medications as of 02/07/2023  Medication Sig Note   amLODipine (NORVASC) 2.5 MG tablet Take 1 tablet (2.5 mg total) by mouth daily.    atorvastatin (LIPITOR) 20 MG tablet Take 1 tablet (20 mg total) by mouth daily.    Calcium Carbonate-Vitamin D (CALTRATE 600+D PO) Take 1 tablet by mouth daily.    cholecalciferol (VITAMIN D) 1000 units tablet Take 1,000 Units by mouth daily.    colchicine 0.6 MG tablet Take 1 tablet (0.6 mg total) by mouth daily as needed (gout or psuedogout pain). 02/07/2023: Taking one daily   irbesartan (AVAPRO) 300 MG tablet Take 1 tablet (300 mg total) by mouth daily.    Misc Natural Products (TART CHERRY ADVANCED PO) Take 1 tablet by mouth daily.    Multiple Vitamins-Minerals (CENTRUM SILVER PO) Take 1 tablet by mouth daily.    QUEtiapine (SEROQUEL) 50 MG tablet Take 1 tablet (50 mg total) by mouth at bedtime.    SYNTHROID 50 MCG tablet TAKE 1 TABLET DAILY BEFORE BREAKFAST FOR HYPOTHYROIDISM    ALPRAZolam (XANAX) 0.25 MG tablet Take 1 tablet (0.25 mg total) by mouth 3 (three) times daily as needed for anxiety. (Patient not taking: Reported on 02/07/2023) 02/07/2023: As needed, does not have any more   fexofenadine (ALLEGRA) 180 MG tablet Take 180 mg by mouth daily. (Patient  not taking: Reported on 02/07/2023) 02/07/2023: Has not needed   fluocinonide (LIDEX) 0.05 % gel Apply 1 application  topically 4 (four) times daily. Reported on 12/12/2015 (Patient not taking: Reported on 02/07/2023) 02/07/2023: Uses prn for canker sores; much better since changer her drinking water   [DISCONTINUED] mirabegron ER (MYRBETRIQ) 25 MG TB24 tablet Take 1 tablet (25 mg total) by mouth daily. (Patient not taking: Reported on 02/07/2023) 02/07/2023: Stopped--samples were ineffective    No facility-administered encounter medications on file as of 02/07/2023.   Allergies  Allergen Reactions   Amoxicillin Rash   Erythromycin Rash   Sulfa Antibiotics Rash    ROS:  no fever, chills, headaches, dizziness, chest pain, shortness of breath, edema. No n/v/d, abdominal pain. Urinary frequency per HPI, unchanged.  No dysuria, hematuria. Moods are good Insomnia is improved Rash/lesion R breast has gotten darker and a little larger R knee pain is much better since taking colchicine.     PHYSICAL EXAM:  BP (!) 140/80   Pulse 84   Ht 5' (1.524 m)   Wt 144 lb (65.3 kg)   BMI 28.12 kg/m   138/72 on repeat by MD, R arm Wt Readings from Last 3 Encounters:  02/07/23 144 lb (65.3 kg)  01/28/23 148 lb (67.1 kg)  12/28/22 146 lb (66.2 kg)   Talkative, excitable female, in no distress. HEENT: conjunctiva and sclera area clear, EOMI. OP clear Neck: no lymphadenopathy, thyromegaly or mass Heart: regular rate and rhythm Lungs: clear bilaterally Back: no CVA tenderness Abdomen: soft, nontender, no mass Extremities: No edema, normal pulses.  Psych: normal mood, affect, hygiene and grooming. Neuro: alert and oriented, cranial nerves grossly intact, normal gait. Skin: R breast-- 2.5 x 2 cm are of mild erythema. 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  Coloration isn't uniform, but no central clearing, no discrete macules or papules within it. This is somewhat larger than when first noted in early May.   ASSESSMENT/PLAN:  Essential hypertension, benign Assessment & Plan: Somewhat improved controlled since amlodipine 2.5 mg added to Avapro 300mg .  Titrate up amlodipine to 5mg  daily.  Risk/SE reviewed. To continue to monitor BP's at home and bring list to f/u in 2 months. (avoiding HCTZ due to already having significant urinary frequency).    Orders: -     amLODIPine Besylate; Take 1 tablet (5 mg total) by mouth daily.  Dispense: 30 tablet; Refill: 3 -     Comprehensive metabolic panel;  Future  Overactive bladder  Medication monitoring encounter -     CBC with Differential/Platelet; Future -     Comprehensive metabolic panel; Future -     Lipid panel; Future -     TSH; Future  Hypothyroidism, unspecified type Assessment & Plan: Adequately replaced per last check in November; clinically euthyroid. Continue Synthroid 50 mcg daily. Recheck prior to physical in November  Orders: -     TSH; Future  Insomnia, unspecified type Assessment & Plan: Improved with increasing seroquel to 50mg . Continue   Pure hypercholesterolemia Assessment & Plan: Controlled with atorvastatin per last labs. Continue med and low cholesterol diet. Recheck lipids prior to CPE in November  Orders: -     Lipid panel; Future  Pseudogout of right knee Assessment & Plan: Pain is improved since taking colchicine daily. (+calcium pyrophosphate crystals on aspiration done by Dr. Denyse Amass 01/2023).   Skin lesion of breast  The erythematous patch at the R chest/breast--seems to be limited to skin discoloration (no induration, change in texture of skin, no pea  d'orange changes). Wondering if could be related to very chronic use of OTC HC. Will stop it for a while and monitor.  Can recheck in 2 mos. Has derm appt in future (but not soon).  F/u 2 months on BP Has CPE scheduled for November. It appears that she has lab visit for prior to this visit (scheduled by LM)--without any orders. Will place orders today for Cbc, c-met, lipids TSH

## 2023-02-07 ENCOUNTER — Ambulatory Visit (INDEPENDENT_AMBULATORY_CARE_PROVIDER_SITE_OTHER): Payer: Medicare HMO | Admitting: Family Medicine

## 2023-02-07 ENCOUNTER — Encounter: Payer: Self-pay | Admitting: Family Medicine

## 2023-02-07 VITALS — BP 140/80 | HR 84 | Ht 60.0 in | Wt 144.0 lb

## 2023-02-07 DIAGNOSIS — M11261 Other chondrocalcinosis, right knee: Secondary | ICD-10-CM | POA: Diagnosis not present

## 2023-02-07 DIAGNOSIS — Z5181 Encounter for therapeutic drug level monitoring: Secondary | ICD-10-CM

## 2023-02-07 DIAGNOSIS — N3281 Overactive bladder: Secondary | ICD-10-CM | POA: Diagnosis not present

## 2023-02-07 DIAGNOSIS — L988 Other specified disorders of the skin and subcutaneous tissue: Secondary | ICD-10-CM | POA: Diagnosis not present

## 2023-02-07 DIAGNOSIS — E7800 Pure hypercholesterolemia, unspecified: Secondary | ICD-10-CM

## 2023-02-07 DIAGNOSIS — I10 Essential (primary) hypertension: Secondary | ICD-10-CM | POA: Diagnosis not present

## 2023-02-07 DIAGNOSIS — E039 Hypothyroidism, unspecified: Secondary | ICD-10-CM

## 2023-02-07 DIAGNOSIS — G4700 Insomnia, unspecified: Secondary | ICD-10-CM

## 2023-02-07 MED ORDER — AMLODIPINE BESYLATE 5 MG PO TABS
5.0000 mg | ORAL_TABLET | Freq: Every day | ORAL | 3 refills | Status: DC
Start: 2023-02-07 — End: 2023-05-02

## 2023-02-07 NOTE — Assessment & Plan Note (Signed)
Adequately replaced per last check in November; clinically euthyroid. Continue Synthroid 50 mcg daily. Recheck prior to physical in November

## 2023-02-07 NOTE — Patient Instructions (Addendum)
We are increasing your amlodipine dose to 5mg  daily. (You can double up on the remaining 2.5 mg tablets, until you start the new prescription). Continue to monitor your blood pressure. Let us know if you have side effects.  Let's try STOPPING the use of hydrocortisone cream, and just keeping an eye on the red spot at your right chest. If it becomes itchy or irritated, then you can restart it.

## 2023-02-07 NOTE — Assessment & Plan Note (Signed)
Controlled with atorvastatin per last labs. Continue med and low cholesterol diet. Recheck lipids prior to CPE in November

## 2023-02-07 NOTE — Assessment & Plan Note (Signed)
Somewhat improved controlled since amlodipine 2.5 mg added to Avapro 300mg .  Titrate up amlodipine to 5mg  daily.  Risk/SE reviewed. To continue to monitor BP's at home and bring list to f/u in 2 months. (avoiding HCTZ due to already having significant urinary frequency).

## 2023-02-07 NOTE — Assessment & Plan Note (Signed)
Pain is improved since taking colchicine daily. (+calcium pyrophosphate crystals on aspiration done by Dr. Denyse Amass 01/2023).

## 2023-02-07 NOTE — Assessment & Plan Note (Signed)
Improved with increasing seroquel to 50mg. Continue 

## 2023-02-09 ENCOUNTER — Encounter: Payer: Medicare HMO | Admitting: Family Medicine

## 2023-02-15 ENCOUNTER — Encounter: Payer: Self-pay | Admitting: Family Medicine

## 2023-02-21 ENCOUNTER — Other Ambulatory Visit: Payer: Self-pay | Admitting: Family Medicine

## 2023-02-21 DIAGNOSIS — I10 Essential (primary) hypertension: Secondary | ICD-10-CM

## 2023-02-23 ENCOUNTER — Encounter: Payer: Self-pay | Admitting: Family Medicine

## 2023-02-24 MED ORDER — ALPRAZOLAM 0.25 MG PO TABS: 0.2500 mg | ORAL_TABLET | Freq: Three times a day (TID) | ORAL | 0 refills | Status: DC | PRN

## 2023-03-03 ENCOUNTER — Ambulatory Visit: Payer: Medicare Other

## 2023-03-03 ENCOUNTER — Other Ambulatory Visit: Payer: Medicare Other

## 2023-03-19 ENCOUNTER — Encounter: Payer: Self-pay | Admitting: Family Medicine

## 2023-03-22 NOTE — Telephone Encounter (Signed)
Forwarding to Dr. Jean Rosenthal to advise regarding Colchicine duration/safety as Dr. Denyse Amass is out of the office this week.

## 2023-03-23 DIAGNOSIS — D225 Melanocytic nevi of trunk: Secondary | ICD-10-CM | POA: Diagnosis not present

## 2023-03-23 DIAGNOSIS — L821 Other seborrheic keratosis: Secondary | ICD-10-CM | POA: Diagnosis not present

## 2023-03-23 DIAGNOSIS — L814 Other melanin hyperpigmentation: Secondary | ICD-10-CM | POA: Diagnosis not present

## 2023-03-23 DIAGNOSIS — L309 Dermatitis, unspecified: Secondary | ICD-10-CM | POA: Diagnosis not present

## 2023-04-02 ENCOUNTER — Encounter: Payer: Self-pay | Admitting: Family Medicine

## 2023-04-18 ENCOUNTER — Other Ambulatory Visit (INDEPENDENT_AMBULATORY_CARE_PROVIDER_SITE_OTHER): Payer: Medicare HMO

## 2023-04-18 DIAGNOSIS — Z23 Encounter for immunization: Secondary | ICD-10-CM | POA: Diagnosis not present

## 2023-05-01 NOTE — Progress Notes (Unsigned)
No chief complaint on file.  Patient presents to follow up on hypertension.  BP was suboptimally controlled on Avapro 300mg .  2.5 mg amlodipine was added in May.   Amlodipine dose was increased to 5 mg in July, as BP was still above goal  She had some BP's that were much higher, related to knee pain. She denies medication side effects. BP's have been running   Monitor has  been verified as accurate  BP Readings from Last 3 Encounters:  02/07/23 (!) 140/80  01/28/23 (!) 182/84  12/28/22 (!) 180/84      Red spot on R chest. This was first noted in May, and had gotten deeper red at last visit (July), using hydrocortisone BID.  She was advised to stop using the cortisone cream. Today she reports   PMH, PSH, SH and FH reviewed/updated  Sister in [place] died in July--UPDATE FH***   ROS:  PHYSICAL EXAM:  There were no vitals taken for this visit.  Wt Readings from Last 3 Encounters:  02/07/23 144 lb (65.3 kg)  01/28/23 148 lb (67.1 kg)  12/28/22 146 lb (66.2 kg)   Talkative, excitable female, in no distress. HEENT: conjunctiva and sclera area clear, EOMI. OP clear Neck: no lymphadenopathy, thyromegaly or mass Heart: regular rate and rhythm Lungs: clear bilaterally Back: no CVA tenderness Abdomen: soft, nontender, no mass Extremities: No edema, normal pulses.  Psych: normal mood, affect, hygiene and grooming. Neuro: alert and oriented, cranial nerves grossly intact, normal gait. Skin: R breast-- 2.5 x 2 cm are of mild erythema. 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  Coloration isn't uniform, but no central clearing, no discrete macules or papules within it.   ***UPDATE SKIN   ASSESSMENT/PLAN:  Flu and COVID shots Will need TB testing at some point before moving to [facility]--no idea when that might be, or if she needs to have it within a certain time frame of moving. ?if she wants quantiferon gold done with her labs in November? If so, please add to future orders.  ?change to 90d  amlodipine if BP controlled and tolerating  F/u as scheduled for CPE in November

## 2023-05-02 ENCOUNTER — Encounter: Payer: Self-pay | Admitting: Family Medicine

## 2023-05-02 ENCOUNTER — Other Ambulatory Visit: Payer: Self-pay | Admitting: *Deleted

## 2023-05-02 ENCOUNTER — Ambulatory Visit (INDEPENDENT_AMBULATORY_CARE_PROVIDER_SITE_OTHER): Payer: Medicare HMO | Admitting: Family Medicine

## 2023-05-02 VITALS — BP 136/74 | HR 80 | Ht 61.0 in | Wt 145.2 lb

## 2023-05-02 DIAGNOSIS — Z111 Encounter for screening for respiratory tuberculosis: Secondary | ICD-10-CM

## 2023-05-02 DIAGNOSIS — Z23 Encounter for immunization: Secondary | ICD-10-CM | POA: Diagnosis not present

## 2023-05-02 DIAGNOSIS — I10 Essential (primary) hypertension: Secondary | ICD-10-CM

## 2023-05-02 DIAGNOSIS — M11261 Other chondrocalcinosis, right knee: Secondary | ICD-10-CM

## 2023-05-02 MED ORDER — AMLODIPINE BESYLATE 5 MG PO TABS: 5.0000 mg | ORAL_TABLET | Freq: Every day | ORAL | 1 refills | Status: DC

## 2023-05-15 ENCOUNTER — Other Ambulatory Visit: Payer: Self-pay | Admitting: Family Medicine

## 2023-05-15 DIAGNOSIS — E039 Hypothyroidism, unspecified: Secondary | ICD-10-CM

## 2023-05-28 ENCOUNTER — Other Ambulatory Visit: Payer: Self-pay | Admitting: Family Medicine

## 2023-05-28 DIAGNOSIS — I10 Essential (primary) hypertension: Secondary | ICD-10-CM

## 2023-06-09 ENCOUNTER — Encounter: Payer: Self-pay | Admitting: Family Medicine

## 2023-06-10 MED ORDER — ALPRAZOLAM 0.25 MG PO TABS: 0.2500 mg | ORAL_TABLET | Freq: Three times a day (TID) | ORAL | 0 refills | Status: AC | PRN

## 2023-06-24 ENCOUNTER — Other Ambulatory Visit: Payer: Medicare HMO

## 2023-06-24 DIAGNOSIS — E039 Hypothyroidism, unspecified: Secondary | ICD-10-CM | POA: Diagnosis not present

## 2023-06-24 DIAGNOSIS — I10 Essential (primary) hypertension: Secondary | ICD-10-CM | POA: Diagnosis not present

## 2023-06-24 DIAGNOSIS — Z5181 Encounter for therapeutic drug level monitoring: Secondary | ICD-10-CM | POA: Diagnosis not present

## 2023-06-24 DIAGNOSIS — E7800 Pure hypercholesterolemia, unspecified: Secondary | ICD-10-CM

## 2023-06-24 DIAGNOSIS — Z111 Encounter for screening for respiratory tuberculosis: Secondary | ICD-10-CM

## 2023-06-28 LAB — CBC WITH DIFFERENTIAL/PLATELET
Basophils Absolute: 0 10*3/uL (ref 0.0–0.2)
Basos: 1 %
EOS (ABSOLUTE): 0.1 10*3/uL (ref 0.0–0.4)
Eos: 2 %
Hematocrit: 40.1 % (ref 34.0–46.6)
Hemoglobin: 13.5 g/dL (ref 11.1–15.9)
Immature Grans (Abs): 0 10*3/uL (ref 0.0–0.1)
Immature Granulocytes: 0 %
Lymphocytes Absolute: 1.2 10*3/uL (ref 0.7–3.1)
Lymphs: 27 %
MCH: 30.9 pg (ref 26.6–33.0)
MCHC: 33.7 g/dL (ref 31.5–35.7)
MCV: 92 fL (ref 79–97)
Monocytes Absolute: 0.5 10*3/uL (ref 0.1–0.9)
Monocytes: 10 %
Neutrophils Absolute: 2.8 10*3/uL (ref 1.4–7.0)
Neutrophils: 60 %
Platelets: 388 10*3/uL (ref 150–450)
RBC: 4.37 x10E6/uL (ref 3.77–5.28)
RDW: 13 % (ref 11.7–15.4)
WBC: 4.6 10*3/uL (ref 3.4–10.8)

## 2023-06-28 LAB — COMPREHENSIVE METABOLIC PANEL
ALT: 25 IU/L (ref 0–32)
AST: 21 IU/L (ref 0–40)
Albumin: 4.6 g/dL (ref 3.7–4.7)
Alkaline Phosphatase: 70 IU/L (ref 44–121)
BUN/Creatinine Ratio: 14 (ref 12–28)
BUN: 10 mg/dL (ref 8–27)
Bilirubin Total: 0.5 mg/dL (ref 0.0–1.2)
CO2: 23 mmol/L (ref 20–29)
Calcium: 10.8 mg/dL — ABNORMAL HIGH (ref 8.7–10.3)
Chloride: 101 mmol/L (ref 96–106)
Creatinine, Ser: 0.72 mg/dL (ref 0.57–1.00)
Globulin, Total: 2.2 g/dL (ref 1.5–4.5)
Glucose: 99 mg/dL (ref 70–99)
Potassium: 4.3 mmol/L (ref 3.5–5.2)
Sodium: 138 mmol/L (ref 134–144)
Total Protein: 6.8 g/dL (ref 6.0–8.5)
eGFR: 83 mL/min/{1.73_m2} (ref >59)

## 2023-06-28 LAB — LIPID PANEL
Chol/HDL Ratio: 2.2 ratio (ref 0.0–4.4)
Cholesterol, Total: 157 mg/dL (ref 100–199)
HDL: 70 mg/dL (ref >39)
LDL Chol Calc (NIH): 72 mg/dL (ref 0–99)
Triglycerides: 76 mg/dL (ref 0–149)
VLDL Cholesterol Cal: 15 mg/dL (ref 5–40)

## 2023-06-28 LAB — TSH: TSH: 1.64 u[IU]/mL (ref 0.450–4.500)

## 2023-06-28 LAB — QUANTIFERON-TB GOLD PLUS
QuantiFERON Mitogen Value: >10 [IU]/mL
QuantiFERON Nil Value: 0.03 [IU]/mL
QuantiFERON TB1 Ag Value: 0.04 [IU]/mL
QuantiFERON TB2 Ag Value: 0.05 [IU]/mL
QuantiFERON-TB Gold Plus: NEGATIVE

## 2023-06-30 ENCOUNTER — Ambulatory Visit (INDEPENDENT_AMBULATORY_CARE_PROVIDER_SITE_OTHER): Payer: Medicare HMO | Admitting: Family Medicine

## 2023-06-30 ENCOUNTER — Encounter: Payer: Self-pay | Admitting: Family Medicine

## 2023-06-30 VITALS — BP 134/74 | HR 72 | Ht 61.0 in | Wt 143.6 lb

## 2023-06-30 DIAGNOSIS — G4700 Insomnia, unspecified: Secondary | ICD-10-CM

## 2023-06-30 DIAGNOSIS — M85852 Other specified disorders of bone density and structure, left thigh: Secondary | ICD-10-CM

## 2023-06-30 DIAGNOSIS — E7800 Pure hypercholesterolemia, unspecified: Secondary | ICD-10-CM

## 2023-06-30 DIAGNOSIS — I10 Essential (primary) hypertension: Secondary | ICD-10-CM | POA: Diagnosis not present

## 2023-06-30 DIAGNOSIS — E039 Hypothyroidism, unspecified: Secondary | ICD-10-CM | POA: Diagnosis not present

## 2023-06-30 DIAGNOSIS — Z0000 Encounter for general adult medical examination without abnormal findings: Secondary | ICD-10-CM | POA: Diagnosis not present

## 2023-06-30 DIAGNOSIS — N3281 Overactive bladder: Secondary | ICD-10-CM

## 2023-06-30 DIAGNOSIS — M11261 Other chondrocalcinosis, right knee: Secondary | ICD-10-CM

## 2023-06-30 NOTE — Progress Notes (Signed)
Chief Complaint  Patient presents with   Annual Exam    Annual exam, breast exam only-no pelvic/rectal. Has plans tomorrow night would like to know if she could come back for NV for Prevnar 20. Printing quant gold for her. She is asking for handicapped placard, brought in form-knee issues.     Lisa [name] is a 82 y.o. female who presents for annual physical, Medicare wellness visit and follow-up on chronic medical conditions.  She had labs done prior to her visit, see below.  She will be moving into [facility] on 08/18/2023. She had reported to [place] a few weeks ago that she was swamped, having to make a lot of decisions, felt overwhelmed and would likely be using some xanax.  We reminded her of other stress-reducing techniques. She has used 1/2 tablet twice since getting the prescription filled.  Hypertension follow-up:  BP was suboptimally controlled on Avapro 300mg .  2.5 mg amlodipine was added in May, further increased to 5 mg in July, as BP was still above goal.  Blood pressure was much better on this regimen, last seen in September. She denies medication side effects. She denies headaches, dizziness, edema, chest pain, palpitations. BP's since her last visit here have been running 122-126/62-68, pulse 70's.  BP Readings from Last 3 Encounters:  06/30/23 134/74  05/02/23 136/74  02/07/23 (!) 140/80   Knee pain--She brings in a form for handicap placard related to her knee. We discussed her knee issues. She has been seeing sports medicine for this.  She was treated with colchicine for pseudogout and pain has improved.  She has allopurinol to use for 5-10 days prn for flares. She currently denies any knee pain. She hasn't taken any colchicine since mid-August. She doesn't need to use any assistive device, and exercises regularly (see below). Discussed the criteria on the form, which she does not meet. She is aware that she also has arthritis, told that she has no  cartilage.  Insomnia and anxiety:  Seroquel dose was increased in 10/2022 to 50mg , with improved sleep. She continues to sleep well. (Previously retried escitalopram, thinking anxiety was contributing to her trouble sleeping, but didn't help).  She previously took lexapro, tapered off after her husband passed away.  She is stressed related to her move, but moods are otherwise good.  Still has some sleep interruptions related to bathroom trips.  She has been getting up 3x/night We previously discussed limiting fluids at night, and caffeine, to help with her urinary frequency.   She also has urinary frequency during the day. She does have some leakage.  She feels like she is managing, not interested in medications. Denies dysuria.   Hyperlipidemia follow-up: Patient is reportedly following a low-fat, low cholesterol diet. Compliant with medications (atorvastatin 20mg ) and denies medication side effects.  See below for recent results.    Hypothyroidism: She denies fatigue, hair, skin, bowel changes. Denies any mood changes. Denies dysphagia or any thyroid concerns. Takes Synthroid 50 mcg on empty stomach, separate from food and other medications. She takes MVI and calcium at lunch.    Osteopenia:   Takes Calcium with D once daily, gets regular exercise, including weights (with silver sneaker classes)  She previously reported that she took Actonel for 10 years, stopped it on her own, concerned about risks.  She had repeat DEXA in 07/2019 which showed stable osteopenia. She was due for 2 year repeat DEXA, but hasn't gotten yet. She rescheduled her July appointment due to her  sister's death, now scheduled for 09-06-22, when she gets her mammogram.   Seasonal allergies: taking OTC Allegra seasonally.  Allergies tend to be worse in the Spring, does well using Allegra prn.  Hasn't need to use any Allegra in a while. Hasn't had any recurrence of vertigo in quite some time.    Immunization History   Administered Date(s) Administered   Fluad Quad(high Dose 65+) 05/22/2019, 06/12/2020, 06/18/2021, 06/01/2022   Fluad Trivalent(High Dose 65+) 04/18/2023   Influenza Split 06/01/2022   Influenza Whole 05/02/2010   Influenza, High Dose Seasonal PF 05/16/2014, 04/23/2015, 05/05/2016, 05/12/2017, 05/24/2018   Moderna Sars-Covid-2 Vaccination 05/06/2022   PFIZER(Purple Top)SARS-COV-2 Vaccination 08/20/2019, 09/10/2019, 05/15/2020   Pfizer Covid-19 Vaccine Bivalent Booster 50yrs & up 04/29/2021   Pfizer(Comirnaty)Fall Seasonal Vaccine 12 years and older 10/25/2022, 05/02/2023   Pneumococcal Conjugate-13 04/17/2014   Pneumococcal Polysaccharide-23 09/21/2005, 08/19/2020   Respiratory Syncytial Virus Vaccine,Recomb Aduvanted(Arexvy) 07/07/2022   Td 01/02/2002   Tdap 01/13/2012, 07/19/2022   Zoster Recombinant(Shingrix) 05/20/2018, 08/21/2018   Zoster, Live 02/26/2011   Last Pap smear: 04/2015, normal no high risk HPV Last mammo: 07/2019 Colonoscopy 5/08  (refuses to do again); negative Cologuard 05/2017 Dexa 07/2019 T -2.0 at L fem neck.  Dentist and ophtho regularly   Exercise:  Raynelle Fanning [employer] Silver [employer]neakers 3x/week (50 minutes, mixture of cardio and weights). She stopped going to the dance classes--were too early.  Vitamin D last checked 04/2015 normal at 40   Patient Care Team: Joselyn Arrow, MD as PCP - General (Family Medicine) Dr. Dario Ave as Consulting Physician (Dentistry) Antony Contras, MD as Consulting Physician (Ophthalmology) Lennette Bihari, MD as Consulting Physician (Cardiology) Dorisann Frames, MD as Referring Physician (Endocrinology) Judi Saa, DO as Consulting Physician [facility])  Dr. Cena Benton medicine [facility] for hearing aids  No longer sees Dr. Tresa Endo, Dr. Talmage Nap or Dr. Terrilee Files   Depression Screening: Flowsheet Row Office Visit from 06/28/2022 in [facility]  PHQ-2 Total Score 0        Falls screen:      05/02/2023    2:36 PM 06/28/2022    1:41 PM 06/24/2021    1:51 PM 06/12/2020   10:50 AM 05/24/2019   10:24 AM  Fall Risk   Falls in the past year? 0 1 0 0 0  Number falls in past yr: 0 0 0  0  Comment  Spring 2023-fell while on vacation     Injury with Fall? 0 1 0  0  Comment  head was bleeding     Risk for fall due to : No Fall Risks History of fall(s) No Fall Risks    Follow up Falls evaluation completed Falls evaluation completed Falls evaluation completed       Functional Status Survey: Is the patient deaf or have difficulty hearing?: Yes (B/L hearing aides) Does the patient have difficulty seeing, even when wearing glasses/contacts?: No Does the patient have difficulty concentrating, remembering, or making decisions?: No Does the patient have difficulty walking or climbing stairs?: No (right knee pain, but no issues) Does the patient have difficulty dressing or bathing?: No Does the patient have difficulty doing errands alone such as visiting a doctor's office or shopping?: No  Mini-Cog Scoring: 5   End of Life Discussion:  Patient has a living will and medical power of attorney.      PMH, PSH, SH and FH were reviewed and updated  Outpatient Encounter Medications as of 06/30/2023  Medication Sig Note  amLODipine (NORVASC) 5 MG tablet Take 1 tablet (5 mg total) by mouth daily.    atorvastatin (LIPITOR) 20 MG tablet Take 1 tablet (20 mg total) by mouth daily.    Calcium Carbonate-Vitamin D (CALTRATE 600+D PO) Take 1 tablet by mouth daily. 06/30/2023: Ran out, just picked up new bottle   cholecalciferol (VITAMIN D) 1000 units tablet Take 1,000 Units by mouth daily.    irbesartan (AVAPRO) 300 MG tablet Take 1 tablet (300 mg total) by mouth daily.    Misc Natural Products (TART CHERRY ADVANCED PO) Take 1 tablet by mouth daily.    Multiple Vitamins-Minerals (CENTRUM SILVER PO) Take 1 tablet by mouth daily.    QUEtiapine (SEROQUEL) 50 MG tablet Take 1 tablet (50 mg total) by  mouth at bedtime.    SYNTHROID 50 MCG tablet TAKE 1 TABLET DAILY BEFORE BREAKFAST FOR HYPOTHYROIDISM    ALPRAZolam (XANAX) 0.25 MG tablet Take 1 tablet (0.25 mg total) by mouth 3 (three) times daily as needed for anxiety. (Patient not taking: Reported on 06/30/2023) 06/30/2023: As needed, rarely   colchicine 0.6 MG tablet Take 1 tablet (0.6 mg total) by mouth daily as needed (gout or psuedogout pain). (Patient not taking: Reported on 05/02/2023) 06/30/2023: As needed for knee pain   fexofenadine (ALLEGRA) 180 MG tablet Take 180 mg by mouth daily. (Patient not taking: Reported on 02/07/2023)    fluocinonide (LIDEX) 0.05 % gel Apply 1 application  topically 4 (four) times daily. Reported on 12/12/2015 (Patient not taking: Reported on 02/07/2023) 06/30/2023: Uses prn for canker sores; much better since changer her drinking water     No facility-administered encounter medications on file as of 06/30/2023.   Allergies  Allergen Reactions   Amoxicillin Rash   Erythromycin Rash   Sulfa Antibiotics Rash    ROS: The patient denies anorexia, fever, weight changes, headaches, vision changes, ear pain, sore throat, breast concerns, chest pain, palpitations, dizziness, syncope, dyspnea on exertion, cough, swelling, nausea, vomiting, diarrhea, constipation, abdominal pain, melena, hematochezia, indigestion/heartburn, hematuria, dysuria, vaginal bleeding, discharge, odor or itch, genital lesions, numbness, tingling, weakness, tremor, suspicious skin lesions, depression, anxiety, abnormal bleeding/bruising, or enlarged lymph nodes.   Uses hearing aids.  Denies significant tinnitus or vertigo. Slight leakage of urine, urinary frequency, unchanged. Up 3x/night.  Denies dysuria, hematuria. Moods are good. Denies any change to fatty growth on the back of her neck. Denies any joint pains, neck pain. Insomnia per HPI, well controlled.   PHYSICAL EXAM:  BP 134/74   Pulse 72   Ht 5\' 1"  (1.549 m)   Wt 143 lb 9.6 oz  (65.1 kg)   BMI 27.13 kg/m   Wt Readings from Last 3 Encounters:  06/30/23 143 lb 9.6 oz (65.1 kg)  05/02/23 145 lb 3.2 oz (65.9 kg)  02/07/23 144 lb (65.3 kg)    General Appearance:   Alert, cooperative, no distress, appears stated age. Somewhat excitable.  Head:   Normocephalic, without obvious abnormality, atraumatic    Eyes:   PERRL, conjunctiva/corneas clear, EOM's intact, fundi benign    Ears:   Normal TM's and external ear canals    Nose:   No drainage or sinus tenderness  Throat:   Normal mucosa  Neck:   Supple, no lymphadenopathy; thyroid: no enlargement/tenderness/nodules; no carotid bruit or JVD. c-spine nontender. There is a 3.5 x 4 cm soft tissue mass at posterior neck.  Appears fairly central, slightly more to the right.. Nontender. Just slightly wider than last year.   Back:  Spine nontender, no curvature, ROM normal, no CVA tenderness    Lungs:   Clear to auscultation bilaterally without wheezes, rales or ronchi; respirations unlabored    Chest Wall:   No tenderness or deformity    Heart:   Regular rate and rhythm, S1 and S2 normal, no murmur, rub or gallop    Breast Exam:   No tenderness, masses, or nipple discharge or inversion. No axillary lymphadenopathy    Abdomen:   Soft, non-tender, nondistended, normoactive bowel sounds, no masses, no hepatosplenomegaly    Genitalia:   Exam declined by patient  Rectal:   Exam declined by patient  Extremities:   No clubbing, cyanosis or edema.  Arthritic changes to fingers, especially on the right (2nd 3rd and 4th fingers). Significant swelling and deformity of the R 3rd PIP joint. No overlying warmth.  Not painful  Pulses:   2+ and symmetric all extremities    Skin:   Skin color, texture, turgor normal, no rashes or lesions. Many scattered SK's and angiomas. Discoloration of distal great toenails (from nail polish), rest are clear  Lymph nodes:  Cervical, supraclavicular, and axillary nodes normal   Neurologic:   Normal strength,  sensation and gait; reflexes 2-3+ and symmetric throughout                 Psych:  Normal mood, affect, hygiene and grooming     Chemistry      Component Value Date/Time   NA 138 06/24/2023 0858   K 4.3 06/24/2023 0858   CL 101 06/24/2023 0858   CO2 23 06/24/2023 0858   BUN 10 06/24/2023 0858   CREATININE 0.72 06/24/2023 0858   CREATININE 0.66 05/05/2017 0745      Component Value Date/Time   CALCIUM 10.8 (H) 06/24/2023 0858   ALKPHOS 70 06/24/2023 0858   AST 21 06/24/2023 0858   ALT 25 06/24/2023 0858   BILITOT 0.5 06/24/2023 0858     Fasting glucose 99  Lab Results  Component Value Date   WBC 4.6 06/24/2023   HGB 13.5 06/24/2023   HCT 40.1 06/24/2023   MCV 92 06/24/2023   PLT 388 06/24/2023   Lab Results  Component Value Date   CHOL 157 06/24/2023   HDL 70 06/24/2023   LDLCALC 72 06/24/2023   TRIG 76 06/24/2023   CHOLHDL 2.2 06/24/2023   Lab Results  Component Value Date   TSH 1.640 06/24/2023   Negative Quantiferon Gold   ASSESSMENT/PLAN:  Annual physical exam  Medicare annual wellness visit, subsequent  Pure hypercholesterolemia - lipids at goal, continue current meds and lowfat, low cholesterol diet  Essential hypertension, benign - well controlled on current regimen, continue  Hypothyroidism, unspecified type - adequately replaced. Continue current dose of Synthroid  Osteopenia of neck of left femur - discussed Ca, D, weight-bearing exercise.  Getting DEXA in January  Pseudogout of right knee - pain resolved, has colchicine to use prn for flares. Doing well.  Discussed handicap placard and why it isn't appropriate for her  Insomnia, unspecified type - well controlled, continue current dose of seroquel  Overactive bladder - reviewed behavioral measures.  Symptoms tolerable, declines trial of medication  Discussed monthly self breast exams and yearly mammograms (past due); at least 30 minutes of aerobic activity at least 5 days/week and  weight-bearing exercise 2x/week; proper sunscreen use reviewed; healthy diet, including goals of calcium and vitamin D intake and alcohol recommendations (less than or equal to 1 drink/day) reviewed; regular seatbelt use; changing  batteries in smoke detectors.  Immunization recommendations discussed--continue yearly high dose flu shots.  Prevnar-20--declines today. Advised to schedule NV or get from the pharmacy. Colon cancer screening recommendations--she declines any further colon cancer screening. Last was a negative Cologuard in 05/2017. F/u DEXA was due 07/2021. Scheduled for DEXA and mammo in 08/2023   Pt is full code, full care. MOST form reviewed and updated  F/u in 6 months for med check, CPE/AWV in 1 year.    Medicare Attestation I have personally reviewed: The patient's medical and social history Their use of alcohol, tobacco or illicit drugs Their current medications and supplements The patient's functional ability including ADLs,fall risks, home safety risks, cognitive, and hearing and visual impairment Diet and physical activities Evidence for depression or mood disorders  The patient's weight, height, BMI have been recorded in the chart.  I have made referrals, counseling, and provided education to the patient based on review of the above and I have provided the patient with a written personalized care plan for preventive services.

## 2023-06-30 NOTE — Patient Instructions (Addendum)
  HEALTH MAINTENANCE RECOMMENDATIONS:  It is recommended that you get at least 30 minutes of aerobic exercise at least 5 days/week (for weight loss, you may need as much as 60-90 minutes). This can be any activity that gets your heart rate up. This can be divided in 10-15 minute intervals if needed, but try and build up your endurance at least once a week.  Weight bearing exercise is also recommended twice weekly.  Eat a healthy diet with lots of vegetables, fruits and fiber.  "Colorful" foods have a lot of vitamins (ie green vegetables, tomatoes, red peppers, etc).  Limit sweet tea, regular sodas and alcoholic beverages, all of which has a lot of calories and sugar.  Up to 1 alcoholic drink daily may be beneficial for women (unless trying to lose weight, watch sugars).  Drink a lot of water.  Calcium recommendations are 1200-1500 mg daily (1500 mg for postmenopausal women or women without ovaries), and vitamin D 1000 IU daily.  This should be obtained from diet and/or supplements (vitamins), and calcium should not be taken all at once, but in divided doses.  Monthly self breast exams and yearly mammograms for women over the age of 37 is recommended.  Sunscreen of at least SPF 30 should be used on all sun-exposed parts of the skin when outside between the hours of 10 am and 4 pm (not just when at beach or pool, but even with exercise, golf, tennis, and yard work!)  Use a sunscreen that says "broad spectrum" so it covers both UVA and UVB rays, and make sure to reapply every 1-2 hours.  Remember to change the batteries in your smoke detectors when changing your clock times in the spring and fall. Carbon monoxide detectors are recommended for your home.  Use your seat belt every time you are in a car, and please drive safely and not be distracted with cell phones and texting while driving.   Lisa [name] , Thank you for taking time to come for your Medicare Wellness Visit. I appreciate your ongoing  commitment to your health goals. Please review the following plan we discussed and let me know if I can assist you in the future.   This is a list of the screening recommended for you and due dates:  Health Maintenance  Topic Date Due   Medicare Annual Wellness Visit  06/29/2024   DTaP/Tdap/Td vaccine (4 - Td or Tdap) 07/19/2032   Pneumonia Vaccine  Completed   Flu Shot  Completed   DEXA scan (bone density measurement)  Completed   COVID-19 Vaccine  Completed   Zoster (Shingles) Vaccine  Completed   HPV Vaccine  Aged Out   Hepatitis C Screening  Discontinued   I recommend getting the Prevnar-20.  You can schedule a nurse visit at our office, or get it from the pharmacy.

## 2023-07-22 ENCOUNTER — Other Ambulatory Visit: Payer: Self-pay | Admitting: Family Medicine

## 2023-07-22 DIAGNOSIS — G4700 Insomnia, unspecified: Secondary | ICD-10-CM

## 2023-08-09 ENCOUNTER — Telehealth: Payer: Self-pay | Admitting: Family Medicine

## 2023-08-09 NOTE — Telephone Encounter (Signed)
  Pt called to update her address and request refill on Quetiapine   advised her that she should have refills, she states it is telling her no refills left  I called [facility], spoke to [place]  and  has 3 refills on Quetiapine  and she last received #90 on 05/17/23 , pt may have requested refill too soon, advised pt of this and she will [facility] this [facility], closer to rx fill date of 1/8

## 2023-08-15 DIAGNOSIS — Z961 Presence of intraocular lens: Secondary | ICD-10-CM | POA: Diagnosis not present

## 2023-08-15 DIAGNOSIS — H52203 Unspecified astigmatism, bilateral: Secondary | ICD-10-CM | POA: Diagnosis not present

## 2023-08-15 DIAGNOSIS — H26493 Other secondary cataract, bilateral: Secondary | ICD-10-CM | POA: Diagnosis not present

## 2023-08-24 ENCOUNTER — Ambulatory Visit
Admission: RE | Admit: 2023-08-24 | Discharge: 2023-08-24 | Disposition: A | Discharge status: Home / Self Care | Payer: Medicare HMO | Source: Ambulatory Visit | Attending: Family Medicine | Admitting: Family Medicine

## 2023-08-24 ENCOUNTER — Ambulatory Visit
Admission: RE | Admit: 2023-08-24 | Discharge: 2023-08-24 | Disposition: A | Discharge status: Home / Self Care | Payer: Medicare Other | Source: Ambulatory Visit | Attending: Family Medicine | Admitting: Family Medicine

## 2023-08-24 DIAGNOSIS — M85852 Other specified disorders of bone density and structure, left thigh: Secondary | ICD-10-CM

## 2023-08-24 DIAGNOSIS — M8588 Other specified disorders of bone density and structure, other site: Secondary | ICD-10-CM | POA: Diagnosis not present

## 2023-08-24 DIAGNOSIS — Z1231 Encounter for screening mammogram for malignant neoplasm of breast: Secondary | ICD-10-CM | POA: Diagnosis not present

## 2023-08-24 DIAGNOSIS — E2839 Other primary ovarian failure: Secondary | ICD-10-CM | POA: Diagnosis not present

## 2023-08-24 DIAGNOSIS — N958 Other specified menopausal and perimenopausal disorders: Secondary | ICD-10-CM | POA: Diagnosis not present

## 2023-08-25 ENCOUNTER — Other Ambulatory Visit: Payer: Self-pay | Admitting: *Deleted

## 2023-08-25 DIAGNOSIS — G4700 Insomnia, unspecified: Secondary | ICD-10-CM

## 2023-08-25 MED ORDER — QUETIAPINE FUMARATE 50 MG PO TABS: 50.0000 mg | ORAL_TABLET | Freq: Every day | ORAL | 0 refills | Status: DC

## 2023-08-25 NOTE — Telephone Encounter (Signed)
Patient only has 3 seroquel left and her mail order is trying to process her request. In the meantime she would like to know if you can call in #[facility] to [facility] so she doesn't run out and in case they do not send (she has been having issues with them).

## 2023-08-29 ENCOUNTER — Telehealth: Payer: Self-pay | Admitting: Family Medicine

## 2023-08-29 NOTE — Telephone Encounter (Signed)
Per chart--we sent in #30 recently because the mail order was "processing" her request and would run out. Last sent to mail order 10/2022 for a year. Thinking they should have a refill left??

## 2023-08-29 NOTE — Telephone Encounter (Signed)
I called [facility] and they sure did have a refill on file. Had them refill it and send it out.

## 2023-08-29 NOTE — Telephone Encounter (Signed)
Is this okay?

## 2023-08-29 NOTE — Telephone Encounter (Signed)
Pt is needing her QUEtiapine sent to [facility] - [place], [place] - [address] AT [facility]

## 2023-09-11 DIAGNOSIS — M810 Age-related osteoporosis without current pathological fracture: Secondary | ICD-10-CM | POA: Insufficient documentation

## 2023-09-11 NOTE — Progress Notes (Unsigned)
No chief complaint on file.  Patient presents to discuss her recent bone density results  We last discussed her osteopenia at her physical in November.  She takes Calcium with D once daily, gets regular exercise, including weights (with silver sneaker classes)   She previously reported that she took Actonel for 10 years, stopped it on her own, concerned about risks.   DEXA in 07/2019 showed stable osteopenia, T -2.0 at L fem neck. Most recent DEXA (08/2023) showed osteoporosis, with T-2.6 at R fem neck (left not assessed/mentioned).   Last vitamin D level was normal at 40, in 2016.   Recent thyroid testing normal. Lab Results  Component Value Date   TSH 1.640 06/24/2023    PMH, PSH, SH reviewed    ROS: She denies any chest pain or dysphagia. No heartburn. No fever, chills, URI symptoms, shortness of breath. No n/v/d, rashes or other concerns today.   PHYSICAL EXAM:  There were no vitals taken for this visit.  Wt Readings from Last 3 Encounters:  06/30/23 143 lb 9.6 oz (65.1 kg)  05/02/23 145 lb 3.2 oz (65.9 kg)  02/07/23 144 lb (65.3 kg)       ASSESSMENT/PLAN:  JUST to discuss osteoporosis. Has appt in May for med check Hoping she doesn't bring up a zillion other issues today  Moved to [facility] last month  We discussed osteoporosis and risks, the importance of adequate calcium intake (and sources), vitamin D, and weight-bearing exercises. We discussed options for treatment--bisphosphonates (oral--weekly/monthly, yearly infusion), Prolia q6 mos.

## 2023-09-12 ENCOUNTER — Encounter: Payer: Self-pay | Admitting: Family Medicine

## 2023-09-12 ENCOUNTER — Ambulatory Visit (INDEPENDENT_AMBULATORY_CARE_PROVIDER_SITE_OTHER): Payer: Medicare HMO | Admitting: Family Medicine

## 2023-09-12 VITALS — BP 122/70 | HR 88 | Ht 61.0 in | Wt 140.0 lb

## 2023-09-12 DIAGNOSIS — M810 Age-related osteoporosis without current pathological fracture: Secondary | ICD-10-CM | POA: Diagnosis not present

## 2023-09-12 MED ORDER — RISEDRONATE SODIUM 150 MG PO TABS: 150.0000 mg | ORAL_TABLET | ORAL | 3 refills | Status: DC

## 2023-09-12 NOTE — Patient Instructions (Signed)
Take the Actonel once a month.  Take it with a full glass of water, on an empty stomach.  Do not eat anything or recline or lay down for at least 30 minutes.  I recommend taking your synthroid very first thing in the morning (and do your floor exercises/stretches), and then 30 minutes later you can take the actonel.

## 2023-09-13 ENCOUNTER — Encounter: Payer: Self-pay | Admitting: Family Medicine

## 2023-09-13 LAB — VITAMIN D 25 HYDROXY (VIT D DEFICIENCY, FRACTURES): Vit D, 25-Hydroxy: 33.5 ng/mL (ref 30.0–100.0)

## 2023-09-20 ENCOUNTER — Encounter: Payer: Self-pay | Admitting: Family Medicine

## 2023-09-20 NOTE — Telephone Encounter (Signed)
Look at refill messages from 1/16.  We got a request, issues with pharmacy. She had a year supply written the end of 10/2022, so shouldn't have needed.  She asked for a 30d supply to be called in locally while dealing with the mail order pharmacy. I have no idea why they mailed her a 25 mg instead of the 50mg . But she can double up and have 6 weeks supply, so doesn't really need it sent in now. She needs to talk with the pharmacy if they sent her the wrong dose. The rx for the year supply on 11/01/22 for which she should have had a refill was the 50 mg dose, not 25mg .

## 2023-09-21 ENCOUNTER — Other Ambulatory Visit: Payer: Self-pay | Admitting: Family Medicine

## 2023-09-21 DIAGNOSIS — G4700 Insomnia, unspecified: Secondary | ICD-10-CM

## 2023-09-28 ENCOUNTER — Other Ambulatory Visit: Payer: Self-pay | Admitting: Family Medicine

## 2023-09-28 DIAGNOSIS — I10 Essential (primary) hypertension: Secondary | ICD-10-CM

## 2023-09-28 DIAGNOSIS — E7800 Pure hypercholesterolemia, unspecified: Secondary | ICD-10-CM

## 2023-09-28 MED ORDER — IRBESARTAN 300 MG PO TABS: 300.0000 mg | ORAL_TABLET | Freq: Every day | ORAL | 0 refills | Status: DC

## 2023-09-28 MED ORDER — ATORVASTATIN CALCIUM 20 MG PO TABS: 20.0000 mg | ORAL_TABLET | Freq: Every day | ORAL | 0 refills | Status: DC

## 2023-09-29 ENCOUNTER — Institutional Professional Consult (permissible substitution): Payer: Medicare HMO | Admitting: Family Medicine

## 2023-10-04 NOTE — Progress Notes (Unsigned)
 No chief complaint on file.  She was recently started on Actonel for osteoporosis. We had discussed her calcium and vitamin D intake. Subsequent messages stated higher doses of vitamin D than when she had told us at her visit. She was invited to bring all of her medications and supplements for review, in order to be able to give better recommendations.  Recent vitamin D-OH level was 33.5.  She had reported taking 1000 IU of D3 that day (when med list reviewed). F/u message reported 125 mcg (5000 IU dose).     PMH, PSH, SH reviewed   ROS:    PHYSICAL EXAM:  There were no vitals taken for this visit.      ASSESSMENT/PLAN:

## 2023-10-05 ENCOUNTER — Encounter: Payer: Self-pay | Admitting: Family Medicine

## 2023-10-05 ENCOUNTER — Ambulatory Visit (INDEPENDENT_AMBULATORY_CARE_PROVIDER_SITE_OTHER): Payer: Medicare HMO | Admitting: Family Medicine

## 2023-10-05 VITALS — BP 128/78 | HR 68 | Ht 61.0 in | Wt 139.6 lb

## 2023-10-05 DIAGNOSIS — M7062 Trochanteric bursitis, left hip: Secondary | ICD-10-CM | POA: Diagnosis not present

## 2023-10-05 DIAGNOSIS — M810 Age-related osteoporosis without current pathological fracture: Secondary | ICD-10-CM | POA: Diagnosis not present

## 2023-10-05 DIAGNOSIS — R350 Frequency of micturition: Secondary | ICD-10-CM | POA: Diagnosis not present

## 2023-10-05 DIAGNOSIS — Z23 Encounter for immunization: Secondary | ICD-10-CM | POA: Diagnosis not present

## 2023-10-05 LAB — POCT URINALYSIS DIP (PROADVANTAGE DEVICE)
Bilirubin, UA: NEGATIVE
Blood, UA: NEGATIVE
Glucose, UA: NEGATIVE mg/dL
Ketones, POC UA: NEGATIVE mg/dL
Leukocytes, UA: NEGATIVE
Nitrite, UA: NEGATIVE
Protein Ur, POC: NEGATIVE mg/dL
Specific Gravity, Urine: 1.01
Urobilinogen, Ur: 0.2
pH, UA: 7.5 (ref 5.0–8.0)

## 2023-10-05 NOTE — Patient Instructions (Addendum)
  Continue your other vitamins-- Take vitamin D3 1000 IU, one pill, once a day. Continue your Calcium with D once a day. Continue your women's multivitamin. You are getting a total of 2800 IU of D3 every day. Your last level was at the lower end of normal, and likely will improve with warmer temps/sun exposure.  20 minutes in the sun without sunscreen will help (no more than that).  You can stop taking the electrolyte and amino acid drink.  I encourage you to cut back on caffeine intake (tea, diet Coke), as this can contribute to urinary frequency. Limit your fluid intake in the evenings.  Have a glass of water with dinner, but limit fluids after that. Try and drink more water earlier in the day (to try and get 6 glasses/day).  If your urine is normal, and if your urinary frequency continues, despite cutting back on caffeine and fluids, then we should consider using a medication to treat overactive bladder.  You have bursitis of your left hip. You can read the information enclosed, and try some of the exercises. Fill in the blanks with holding the stretch for 10-15 seconds, repeating 10 times, 2 times/day. If you aren't improving, you can see Dr. Denyse Amass or myself for a cortisone injection into the bursa.

## 2023-10-10 ENCOUNTER — Encounter: Payer: Self-pay | Admitting: Family Medicine

## 2023-10-10 ENCOUNTER — Ambulatory Visit: Payer: Medicare HMO | Admitting: Family Medicine

## 2023-10-10 ENCOUNTER — Other Ambulatory Visit: Payer: Self-pay

## 2023-10-10 ENCOUNTER — Ambulatory Visit (INDEPENDENT_AMBULATORY_CARE_PROVIDER_SITE_OTHER)

## 2023-10-10 VITALS — BP 162/78 | HR 95 | Ht 61.0 in | Wt 138.0 lb

## 2023-10-10 DIAGNOSIS — M25552 Pain in left hip: Secondary | ICD-10-CM | POA: Diagnosis not present

## 2023-10-10 DIAGNOSIS — M810 Age-related osteoporosis without current pathological fracture: Secondary | ICD-10-CM | POA: Diagnosis not present

## 2023-10-10 DIAGNOSIS — M160 Bilateral primary osteoarthritis of hip: Secondary | ICD-10-CM | POA: Diagnosis not present

## 2023-10-10 MED ORDER — IBANDRONATE SODIUM 150 MG PO TABS: 150.0000 mg | ORAL_TABLET | ORAL | 12 refills | Status: DC

## 2023-10-10 NOTE — Patient Instructions (Addendum)
 Thank you for coming in today.   I've referred you to Physical Therapy.  Let us know if you don't hear from them in one week.   You received an injection today. Seek immediate medical attention if the joint becomes red, extremely painful, or is oozing fluid.   Please get an Xray today before you leave  Check back as needed

## 2023-10-10 NOTE — Progress Notes (Signed)
 [name], PhD, LAT, ATC acting as a scribe for Lisa Graham, MD.  Lisa [name] is a 83 y.o. female who presents to [facility] at [facility] today for L hip pain. Pt was previously seen by Dr. Denyse Amass on 01/28/23 for her R knee pain.  Today, pt c/o L hip pain ongoing for about a wk. She notes she started living at [facility] about 2 months. Pt locates pain to the lateral aspect of her L hip. She also notes her recent dx of osteoporosis.   Dx testing: 08/24/23 DEXA scan  Pertinent review of systems: No fevers or chills  Relevant historical information: Hypertension and osteoporosis. She notes diarrhea with Actonel osteoporosis treatment.  Exam:  BP (!) 162/78 (BP Location: Right Arm)   Pulse 95   Ht 5\' 1"  (1.549 m)   Wt 138 lb (62.6 kg)   SpO2 98%   BMI 26.07 kg/m  General: Well Developed, well nourished, and in no acute distress.   MSK: Left hip: Normal-appearing Tender palpation greater trochanter.  Normal hip motion.  Hip abduction strength mildly diminished.    Lab and Radiology Results  Procedure: Real-time Ultrasound Guided Injection of left hip greater trochanter bursa Device: Philips Affiniti 50G/GE Logiq Images permanently stored and available for review in PACS Verbal informed consent obtained.  Discussed risks and benefits of procedure. Warned about infection, bleeding, hyperglycemia damage to structures among others. Patient expresses understanding and agreement Time-out conducted.   Noted no overlying erythema, induration, or other signs of local infection.   Skin prepped in a sterile fashion.   Local anesthesia: Topical Ethyl chloride.   With sterile technique and under real time ultrasound guidance: 40 mg of Kenalog and 2 mL of Marcaine injected into bursa. Fluid seen entering the bursa.   Completed without difficulty   Pain immediately resolved suggesting accurate placement of the medication.   Advised to call if  fevers/chills, erythema, induration, drainage, or persistent bleeding.   Images permanently stored and available for review in the ultrasound unit.  Impression: Technically successful ultrasound guided injection.   X-ray images left hip obtained today personally and independently interpreted. Mild left hip osteoarthritis.  No acute fractures are visible. Await formal radiology review     Assessment and Plan: 83 y.o. female with chronic left hip pain due to greater trochanteric bursitis. Plan for injection today and x-ray.  Refer to PT.  She has physical therapy at her residence at friend's home when asked.  Osteoporosis management.  PCP started her on a monthly oral bisphosphonate Actonel.  This seems to cause some diarrhea.  Will switch to a similar monthly oral bisphosphonate Boniva and see if she tolerates it better.  If she cannot tolerate oral bisphosphonates we could arrange for Reclast infusion or Prolia.  PDMP not reviewed this encounter. Orders Placed This Encounter  Procedures   [place] LIMITED JOINT SPACE STRUCTURES LOW LEFT(NO LINKED CHARGES)    Reason for Exam (SYMPTOM  OR DIAGNOSIS REQUIRED):   left hip pain    Preferred imaging location?:   [facility] HIP UNILAT W OR W/O PELVIS 2-3 VIEWS LEFT    Standing Status:   Future    Number of Occurrences:   1    Expiration Date:   11/10/2023    Reason for Exam (SYMPTOM  OR DIAGNOSIS REQUIRED):   left hip pain    Preferred imaging location?:   [facility] referral  to Physical Therapy    Referral Priority:   Routine    Referral Type:   Physical Medicine    Referral Reason:   Specialty Services Required    Requested Specialty:   Physical Therapy    Number of Visits Requested:   1   Meds ordered this encounter  Medications   ibandronate (BONIVA) 150 MG tablet    Sig: Take 1 tablet (150 mg total) by mouth every 30 (thirty) days. Take in the morning with a full glass of water, on  an empty stomach, and do not take anything else by mouth or lie down for the next 30 min.    Dispense:  4 tablet    Refill:  12     Discussed warning signs or symptoms. Please see discharge instructions. Patient expresses understanding.   The above documentation has been reviewed and is accurate and complete Lisa [name], M.D.

## 2023-10-11 NOTE — Telephone Encounter (Signed)
 Forwarding to Dr. Denyse Amass to review and advise.

## 2023-10-22 ENCOUNTER — Encounter: Payer: Self-pay | Admitting: Family Medicine

## 2023-10-22 ENCOUNTER — Other Ambulatory Visit: Payer: Self-pay | Admitting: Family Medicine

## 2023-10-22 DIAGNOSIS — G4700 Insomnia, unspecified: Secondary | ICD-10-CM

## 2023-10-22 NOTE — Telephone Encounter (Signed)
 Looks like [facility].  Okay to change to mail order for #90 RF x 2 (physical is in 07/2024).  Ok for the seroquel 50mg  #90 RF x 2 OK to RF the Synthroid--gets from [place] pharmacy, also #90 x 2 RF.

## 2023-10-24 ENCOUNTER — Other Ambulatory Visit: Payer: Self-pay | Admitting: *Deleted

## 2023-10-24 DIAGNOSIS — E039 Hypothyroidism, unspecified: Secondary | ICD-10-CM

## 2023-10-24 DIAGNOSIS — G4700 Insomnia, unspecified: Secondary | ICD-10-CM

## 2023-10-24 MED ORDER — QUETIAPINE FUMARATE 50 MG PO TABS: 50.0000 mg | ORAL_TABLET | Freq: Every day | ORAL | 2 refills | Status: DC

## 2023-10-24 MED ORDER — SYNTHROID 50 MCG PO TABS: ORAL_TABLET | ORAL | 2 refills | Status: DC

## 2023-10-24 MED ORDER — IBANDRONATE SODIUM 150 MG PO TABS: 150.0000 mg | ORAL_TABLET | ORAL | 2 refills | Status: DC

## 2023-10-24 NOTE — Telephone Encounter (Signed)
 Done

## 2023-10-25 ENCOUNTER — Other Ambulatory Visit: Payer: Self-pay | Admitting: Family Medicine

## 2023-10-25 ENCOUNTER — Other Ambulatory Visit: Payer: Self-pay | Admitting: *Deleted

## 2023-10-25 DIAGNOSIS — G4700 Insomnia, unspecified: Secondary | ICD-10-CM

## 2023-10-25 DIAGNOSIS — M6281 Muscle weakness (generalized): Secondary | ICD-10-CM | POA: Diagnosis not present

## 2023-10-25 DIAGNOSIS — M25552 Pain in left hip: Secondary | ICD-10-CM | POA: Diagnosis not present

## 2023-10-25 DIAGNOSIS — M810 Age-related osteoporosis without current pathological fracture: Secondary | ICD-10-CM

## 2023-10-25 MED ORDER — RISEDRONATE SODIUM 150 MG PO TABS: 150.0000 mg | ORAL_TABLET | ORAL | 2 refills | Status: DC

## 2023-10-31 ENCOUNTER — Encounter: Payer: Self-pay | Admitting: Family Medicine

## 2023-10-31 DIAGNOSIS — M6281 Muscle weakness (generalized): Secondary | ICD-10-CM | POA: Diagnosis not present

## 2023-10-31 DIAGNOSIS — M25552 Pain in left hip: Secondary | ICD-10-CM | POA: Diagnosis not present

## 2023-10-31 NOTE — Progress Notes (Signed)
 Left hip x-ray shows mild arthritis of both hips and the low back but no broken bones are visible.

## 2023-11-07 DIAGNOSIS — M6281 Muscle weakness (generalized): Secondary | ICD-10-CM | POA: Diagnosis not present

## 2023-11-07 DIAGNOSIS — M25552 Pain in left hip: Secondary | ICD-10-CM | POA: Diagnosis not present

## 2023-11-15 ENCOUNTER — Other Ambulatory Visit: Payer: Self-pay | Admitting: Family Medicine

## 2023-11-15 ENCOUNTER — Encounter: Payer: Self-pay | Admitting: *Deleted

## 2023-11-15 ENCOUNTER — Telehealth: Payer: Self-pay | Admitting: *Deleted

## 2023-11-15 DIAGNOSIS — M810 Age-related osteoporosis without current pathological fracture: Secondary | ICD-10-CM

## 2023-11-15 NOTE — Telephone Encounter (Signed)
 Copied from CRM [id number]. Topic: Clinical - Medication Question >> Nov 15, 2023  1:07 PM Yolanda T wrote: Reason for CRM: patient called [facility] [address] Patient is requesting a call back from [place].  Called patient.

## 2023-11-15 NOTE — Telephone Encounter (Signed)
 This was sent 10/25/23 x 9 months to her mail order, left message to ask if she wanted it sent to [facility] instead?

## 2023-11-15 NOTE — Telephone Encounter (Signed)
 Copied from CRM [id number]. Topic: Clinical - Medication Refill >> Nov 15, 2023  8:25 AM Elizebeth Brooking wrote: Most Recent Primary Care Visit:  Provider: Lynelle Doctor, [name]  Department: [facility]  Visit Type: CONSULT 30  Date: 10/05/2023  Medication: risedronate (ACTONEL) 150 MG tablet  Has the patient contacted their pharmacy? Yes (Agent: If no, request that the patient contact the pharmacy for the refill. If patient does not wish to contact the pharmacy document the reason why and proceed with request.) (Agent: If yes, when and what did the pharmacy advise?)  Is this the correct pharmacy for this prescription? Yes If no, delete pharmacy and type the correct one.  This is the patient's preferred pharmacy:  [id number] [place], [place] - [address] AT [facility] [address] [facility] [address] [place] [address] Phone: [phone] Fax: [phone]  [facility] - [place], [place] - [address] [place] [address] Phone: [phone] Fax: [phone]  [phone] [place], [place] - [address] [place] [address] Phone: [phone] Fax: [phone]  [facility] [place], [place] - [address] [facility] AT [facility] to [facility] [address] [place] [address] Phone: [phone] Fax: [phone]  [id number] [place] [facility] [place] [address] [place] [address] Phone: [phone] Fax: [phone]   Has the prescription been filled recently? No  Is the patient out of the medication? Yes  Has the patient been seen for an appointment in the last year OR does the patient have an upcoming appointment? Yes  Can we respond through MyChart? Yes  Agent: Please be advised that Rx refills may take up to 3 business days. We ask that you follow-up with your pharmacy.

## 2023-11-15 NOTE — Telephone Encounter (Signed)
 Spoke with patient and she is fine with [facility]-she will contact them for refill.

## 2023-12-11 ENCOUNTER — Other Ambulatory Visit: Payer: Self-pay | Admitting: Family Medicine

## 2023-12-11 DIAGNOSIS — I10 Essential (primary) hypertension: Secondary | ICD-10-CM

## 2023-12-13 DIAGNOSIS — M6281 Muscle weakness (generalized): Secondary | ICD-10-CM | POA: Diagnosis not present

## 2023-12-13 DIAGNOSIS — M25552 Pain in left hip: Secondary | ICD-10-CM | POA: Diagnosis not present

## 2023-12-18 NOTE — Progress Notes (Unsigned)
 No chief complaint on file.  Patient presents for follow-up on chronic problems.  She saw Dr. Alease Hunter in March and got a cortisone injection for left trochanteric bursitis. Xrays were also done, showing mild degenerative changes at the hips bilaterally and lower lumbar spine.   Hypertension follow-up:  Patient reports compliance in taking Avapro  300mg . And amlodipine  5 mg daily. She denies medication side effects. She denies headaches, dizziness, edema, chest pain, palpitations. BP's are running ***  BP Readings from Last 3 Encounters:  10/10/23 (!) 162/78  10/05/23 128/78  09/12/23 122/70     Hyperlipidemia follow-up: Patient is reportedly following a low-fat, low cholesterol diet. Compliant with medications (atorvastatin  20mg ) and denies medication side effects.   Lipids were at goal on last check. Lab Results  Component Value Date   CHOL 157 06/24/2023   HDL 70 06/24/2023   LDLCALC 72 06/24/2023   TRIG 76 06/24/2023   CHOLHDL 2.2 06/24/2023      Hypothyroidism: She denies fatigue, hair, skin, bowel changes. Denies any mood changes. Denies dysphagia or any thyroid  concerns. Takes Synthroid  50 mcg on empty stomach, separate from food and other medications. She takes MVI and calcium  at lunch.  Thyroid  was adequately replaced on this dose per last labs.  Lab Results  Component Value Date   TSH 1.640 06/24/2023      Seasonal allergies: taking OTC Allegra seasonally.   Insomnia and anxiety:  She had been sleeping well with seroquel . Dose was increased in 10/2022 to 50mg  per her request, when had more trouble sleeping, which helped. She previously took lexapro , tapered off after her husband passed away. Re-tried this for insomnia, but didn't help. Moods are good.    In 09/2023 she reported not sleeping well.  Doubling up on the seroquel  helped her fall asleep, but she was waking up every 1/2 hour to go to the bathroom and sometimes had trouble getting back to sleep. She  reported voiding frequently during the day as well. She denied dysuria and urine was normal.  We discussed her caffeine intake, which included 2 mugs of tea every morning (one is herbal), some chocolate daily, and 7.5 oz of Diet Coke at lunch. We encouraged her to cut back on caffeine, limit evening fluid intake. consider trial of med for OAB if still very bothersome after trying these measures.  Today she reports ***   Osteoporosis: She restarted Actonel  monthly in 09/2023. She is tolerating this without side effects.   Most recent DEXA (08/2023) showed osteoporosis, with T-2.6 at R fem neck (left not assessed/mentioned). (Previously took Actonel  for about 10 years).     PMH, PSH, SH reviewed   ROS: no fever, chills, URI symptoms, headaches, dizziness, chest pain, shortness of breath. Denies edema. No n/v/d, bleeding, bruising, rash. Moods are good. Insomnia Urinary frequency and nocturia  Left hip pain? R knee pain?   PHYSICAL EXAM:  There were no vitals taken for this visit.  Wt Readings from Last 3 Encounters:  10/10/23 138 lb (62.6 kg)  10/05/23 139 lb 9.6 oz (63.3 kg)  09/12/23 140 lb (63.5 kg)    Pleasant female, somewhat anxious, jumps from topic to topic. HEENT: conjunctiva and sclera are clear, EOMI Neck: No lymphadenopathy or mass, no thyromegaly or carotid bruit Heart: regular rate and rhythm Lungs: clear bilaterally Back: no spinal or CVA tenderness Abdomen: soft, nontender, no mass Extremities: tender at L trochanteric bursa. NT over IT Band. No groin tenderness or pain with external rotation. ***UPDATE No edema,  normal pulses Neuro: alert and oriented, cranial nerves grossly intact, normal strength, gait Psych: anxious, full range of affect. Nomal eye contact, speech, hygiene and grooming.   ASSESSMENT/PLAN:  F/u as scheduled for CPE in December.

## 2023-12-19 ENCOUNTER — Ambulatory Visit (INDEPENDENT_AMBULATORY_CARE_PROVIDER_SITE_OTHER): Admitting: Family Medicine

## 2023-12-19 ENCOUNTER — Encounter: Payer: Self-pay | Admitting: Family Medicine

## 2023-12-19 VITALS — BP 128/70 | HR 76 | Ht 61.0 in | Wt 141.8 lb

## 2023-12-19 DIAGNOSIS — E7800 Pure hypercholesterolemia, unspecified: Secondary | ICD-10-CM

## 2023-12-19 DIAGNOSIS — M810 Age-related osteoporosis without current pathological fracture: Secondary | ICD-10-CM

## 2023-12-19 DIAGNOSIS — E039 Hypothyroidism, unspecified: Secondary | ICD-10-CM | POA: Diagnosis not present

## 2023-12-19 DIAGNOSIS — R350 Frequency of micturition: Secondary | ICD-10-CM

## 2023-12-19 DIAGNOSIS — I10 Essential (primary) hypertension: Secondary | ICD-10-CM | POA: Diagnosis not present

## 2023-12-19 DIAGNOSIS — G4700 Insomnia, unspecified: Secondary | ICD-10-CM

## 2023-12-19 DIAGNOSIS — F411 Generalized anxiety disorder: Secondary | ICD-10-CM

## 2023-12-19 MED ORDER — AMLODIPINE BESYLATE 5 MG PO TABS: 5.0000 mg | ORAL_TABLET | Freq: Every day | ORAL | 1 refills | Status: DC

## 2023-12-19 MED ORDER — QUETIAPINE FUMARATE 25 MG PO TABS: 25.0000 mg | ORAL_TABLET | Freq: Every day | ORAL | 1 refills | Status: DC

## 2023-12-19 MED ORDER — ATORVASTATIN CALCIUM 20 MG PO TABS: 20.0000 mg | ORAL_TABLET | Freq: Every day | ORAL | 1 refills | Status: DC

## 2023-12-19 MED ORDER — IRBESARTAN 300 MG PO TABS
300.0000 mg | ORAL_TABLET | Freq: Every day | ORAL | 1 refills | Status: AC
Start: 2023-12-19 — End: ?

## 2023-12-20 ENCOUNTER — Ambulatory Visit: Payer: Self-pay | Admitting: Family Medicine

## 2023-12-20 LAB — COMPREHENSIVE METABOLIC PANEL WITH GFR
ALT: 26 IU/L (ref 0–32)
AST: 22 IU/L (ref 0–40)
Albumin: 4.5 g/dL (ref 3.7–4.7)
Alkaline Phosphatase: 77 IU/L (ref 44–121)
BUN/Creatinine Ratio: 18 (ref 12–28)
BUN: 11 mg/dL (ref 8–27)
Bilirubin Total: 0.3 mg/dL (ref 0.0–1.2)
CO2: 24 mmol/L (ref 20–29)
Calcium: 10.6 mg/dL — ABNORMAL HIGH (ref 8.7–10.3)
Chloride: 99 mmol/L (ref 96–106)
Creatinine, Ser: 0.6 mg/dL (ref 0.57–1.00)
Globulin, Total: 2 g/dL (ref 1.5–4.5)
Glucose: 95 mg/dL (ref 70–99)
Potassium: 4.6 mmol/L (ref 3.5–5.2)
Sodium: 137 mmol/L (ref 134–144)
Total Protein: 6.5 g/dL (ref 6.0–8.5)
eGFR: 89 mL/min/{1.73_m2} (ref >59)

## 2023-12-26 DIAGNOSIS — M6281 Muscle weakness (generalized): Secondary | ICD-10-CM | POA: Diagnosis not present

## 2023-12-26 DIAGNOSIS — M25552 Pain in left hip: Secondary | ICD-10-CM | POA: Diagnosis not present

## 2023-12-28 ENCOUNTER — Encounter: Payer: Medicare HMO | Admitting: Family Medicine

## 2024-01-03 ENCOUNTER — Encounter: Payer: Medicare HMO | Admitting: Family Medicine

## 2024-03-26 DIAGNOSIS — L821 Other seborrheic keratosis: Secondary | ICD-10-CM | POA: Diagnosis not present

## 2024-03-26 DIAGNOSIS — D225 Melanocytic nevi of trunk: Secondary | ICD-10-CM | POA: Diagnosis not present

## 2024-03-26 DIAGNOSIS — D0471 Carcinoma in situ of skin of right lower limb, including hip: Secondary | ICD-10-CM | POA: Diagnosis not present

## 2024-03-26 DIAGNOSIS — D485 Neoplasm of uncertain behavior of skin: Secondary | ICD-10-CM | POA: Diagnosis not present

## 2024-03-26 DIAGNOSIS — L814 Other melanin hyperpigmentation: Secondary | ICD-10-CM | POA: Diagnosis not present

## 2024-04-16 ENCOUNTER — Encounter: Payer: Self-pay | Admitting: Family Medicine

## 2024-04-19 ENCOUNTER — Other Ambulatory Visit: Payer: Self-pay | Admitting: *Deleted

## 2024-04-19 ENCOUNTER — Telehealth: Payer: Self-pay | Admitting: *Deleted

## 2024-04-19 DIAGNOSIS — Z5181 Encounter for therapeutic drug level monitoring: Secondary | ICD-10-CM

## 2024-04-19 DIAGNOSIS — I10 Essential (primary) hypertension: Secondary | ICD-10-CM

## 2024-04-19 DIAGNOSIS — E039 Hypothyroidism, unspecified: Secondary | ICD-10-CM

## 2024-04-19 DIAGNOSIS — Z23 Encounter for immunization: Secondary | ICD-10-CM

## 2024-04-19 DIAGNOSIS — E7800 Pure hypercholesterolemia, unspecified: Secondary | ICD-10-CM

## 2024-04-19 MED ORDER — COVID-19 MRNA VAC-TRIS(PFIZER) 30 MCG/0.3ML IM SUSY: 0.3000 mL | PREFILLED_SYRINGE | Freq: Once | INTRAMUSCULAR | 0 refills | Status: AC

## 2024-04-19 MED ORDER — COVID-19 MRNA VAC-TRIS(PFIZER) 30 MCG/0.3ML IM SUSY: 0.3000 mL | PREFILLED_SYRINGE | Freq: Once | INTRAMUSCULAR | 0 refills | Status: DC

## 2024-04-19 NOTE — Telephone Encounter (Signed)
 done

## 2024-04-19 NOTE — Telephone Encounter (Signed)
 Patient scheduled labs for 07/02/24 need orders please, thanks.

## 2024-04-19 NOTE — Telephone Encounter (Signed)
 Copied from CRM [id number]. Topic: Clinical - Medication Question >> Apr 19, 2024 11:15 AM Antwanette L wrote: Reason for CRM: Pt needs to speak w/ Lucienne about some shots. Please have Lucienne to call the pt at [phone]  Detailed message left on patient's vm.

## 2024-04-21 ENCOUNTER — Encounter: Payer: Self-pay | Admitting: Family Medicine

## 2024-05-02 ENCOUNTER — Encounter: Payer: Self-pay | Admitting: *Deleted

## 2024-05-05 ENCOUNTER — Encounter: Payer: Self-pay | Admitting: Family Medicine

## 2024-05-07 ENCOUNTER — Other Ambulatory Visit: Payer: Self-pay | Admitting: *Deleted

## 2024-05-07 DIAGNOSIS — M810 Age-related osteoporosis without current pathological fracture: Secondary | ICD-10-CM

## 2024-05-07 MED ORDER — RISEDRONATE SODIUM 150 MG PO TABS: 150.0000 mg | ORAL_TABLET | ORAL | 0 refills | Status: DC

## 2024-05-09 DIAGNOSIS — C44311 Basal cell carcinoma of skin of nose: Secondary | ICD-10-CM | POA: Diagnosis not present

## 2024-05-30 ENCOUNTER — Other Ambulatory Visit: Payer: Self-pay | Admitting: Family Medicine

## 2024-05-30 DIAGNOSIS — I10 Essential (primary) hypertension: Secondary | ICD-10-CM

## 2024-05-30 DIAGNOSIS — Z48817 Encounter for surgical aftercare following surgery on the skin and subcutaneous tissue: Secondary | ICD-10-CM | POA: Diagnosis not present

## 2024-05-30 DIAGNOSIS — D0471 Carcinoma in situ of skin of right lower limb, including hip: Secondary | ICD-10-CM | POA: Diagnosis not present

## 2024-06-11 ENCOUNTER — Encounter: Payer: Self-pay | Admitting: Family Medicine

## 2024-06-21 ENCOUNTER — Other Ambulatory Visit: Payer: Self-pay | Admitting: Family Medicine

## 2024-06-21 DIAGNOSIS — E7800 Pure hypercholesterolemia, unspecified: Secondary | ICD-10-CM

## 2024-07-02 ENCOUNTER — Other Ambulatory Visit: Payer: Self-pay

## 2024-07-04 ENCOUNTER — Other Ambulatory Visit

## 2024-07-04 DIAGNOSIS — I10 Essential (primary) hypertension: Secondary | ICD-10-CM

## 2024-07-04 DIAGNOSIS — E039 Hypothyroidism, unspecified: Secondary | ICD-10-CM | POA: Diagnosis not present

## 2024-07-04 DIAGNOSIS — Z5181 Encounter for therapeutic drug level monitoring: Secondary | ICD-10-CM | POA: Diagnosis not present

## 2024-07-04 DIAGNOSIS — E7800 Pure hypercholesterolemia, unspecified: Secondary | ICD-10-CM | POA: Diagnosis not present

## 2024-07-05 LAB — COMPREHENSIVE METABOLIC PANEL WITH GFR
ALT: 24 IU/L (ref 0–32)
AST: 22 IU/L (ref 0–40)
Albumin: 4.6 g/dL (ref 3.7–4.7)
Alkaline Phosphatase: 61 IU/L (ref 48–129)
BUN/Creatinine Ratio: 18 (ref 12–28)
BUN: 14 mg/dL (ref 8–27)
Bilirubin Total: 0.5 mg/dL (ref 0.0–1.2)
CO2: 23 mmol/L (ref 20–29)
Calcium: 10.1 mg/dL (ref 8.7–10.3)
Chloride: 101 mmol/L (ref 96–106)
Creatinine, Ser: 0.77 mg/dL (ref 0.57–1.00)
Globulin, Total: 2.3 g/dL (ref 1.5–4.5)
Glucose: 101 mg/dL — ABNORMAL HIGH (ref 70–99)
Potassium: 4.2 mmol/L (ref 3.5–5.2)
Sodium: 140 mmol/L (ref 134–144)
Total Protein: 6.9 g/dL (ref 6.0–8.5)
eGFR: 76 mL/min/1.73 (ref >59)

## 2024-07-05 LAB — LIPID PANEL
Chol/HDL Ratio: 2.3 ratio (ref 0.0–4.4)
Cholesterol, Total: 154 mg/dL (ref 100–199)
HDL: 66 mg/dL (ref >39)
LDL Chol Calc (NIH): 73 mg/dL (ref 0–99)
Triglycerides: 76 mg/dL (ref 0–149)
VLDL Cholesterol Cal: 15 mg/dL (ref 5–40)

## 2024-07-05 LAB — CBC WITH DIFFERENTIAL/PLATELET
Basophils Absolute: 0.1 x10E3/uL (ref 0.0–0.2)
Basos: 1 %
EOS (ABSOLUTE): 0.2 x10E3/uL (ref 0.0–0.4)
Eos: 3 %
Hematocrit: 40.6 % (ref 34.0–46.6)
Hemoglobin: 13.2 g/dL (ref 11.1–15.9)
Immature Grans (Abs): 0 x10E3/uL (ref 0.0–0.1)
Immature Granulocytes: 0 %
Lymphocytes Absolute: 1.4 x10E3/uL (ref 0.7–3.1)
Lymphs: 24 %
MCH: 30.8 pg (ref 26.6–33.0)
MCHC: 32.5 g/dL (ref 31.5–35.7)
MCV: 95 fL (ref 79–97)
Monocytes Absolute: 0.6 x10E3/uL (ref 0.1–0.9)
Monocytes: 10 %
Neutrophils Absolute: 3.5 x10E3/uL (ref 1.4–7.0)
Neutrophils: 62 %
Platelets: 376 x10E3/uL (ref 150–450)
RBC: 4.29 x10E6/uL (ref 3.77–5.28)
RDW: 12.6 % (ref 11.7–15.4)
WBC: 5.7 x10E3/uL (ref 3.4–10.8)

## 2024-07-05 LAB — TSH: TSH: 1.56 u[IU]/mL (ref 0.450–4.500)

## 2024-07-08 NOTE — Progress Notes (Unsigned)
 No chief complaint on file.    Subjective:   Lisa [name] is a 83 y.o. female who presents for a Medicare Annual Wellness Visit.  Allergies (verified) Amoxicillin, Erythromycin, and Sulfa antibiotics   History: Past Medical History:  Diagnosis Date   Allergy    Colon polyps    hyperplastic   Hypercholesteremia    Hypertension    Insomnia    Leukopenia    Multinodular thyroid     OA (osteoarthritis)    hips, knees and LS   Osteopenia    Psoriasis    Shingles 08/2016   left forehead   Vitamin D  deficiency    Past Surgical History:  Procedure Laterality Date   cardiolyte  02-2002   CATARACT EXTRACTION, BILATERAL Bilateral 04/05/18 and 05/09/18   Dr. Ruth ([place] [facility])   COLONOSCOPY  12-2006   FLEXIBLE SIGMOIDOSCOPY  05-2000   HAND TENDON SURGERY  1979   L 4th finger   KNEE SURGERY  1969   Left, cartilage   TONSILLECTOMY AND ADENOIDECTOMY     Family History  Problem Relation Age of Onset   Allergies Mother    Hypertension Mother    Vision loss Mother    Heart disease Mother    Arthritis Mother    Arthritis Father    Heart disease Father 34   Hearing loss Sister    Hypertension Sister    Hyperlipidemia Sister    Arthritis Sister    Heart disease Sister 25       massive MI   Arthritis Sister    Heart disease Sister    Hyperlipidemia Sister    Hypertension Sister    Breast cancer Sister        had lumpectomy earlier; at 7 had mastectomy   Arthritis Sister    Hypertension Sister    Hyperlipidemia Sister    Thyroid  disease Sister    Diabetes Sister 4       diet controlled   Vision loss Sister        macular degeneration   Stroke Maternal Grandmother    Heart disease Maternal Grandfather    Stroke Maternal Grandfather    Heart disease Paternal Grandmother    Stroke Paternal Grandmother    Cancer Neg Hx    Social History   Occupational History   Occupation: Admin at [employer]    Employer: [employer]  Tobacco Use    Smoking status: Never   Smokeless tobacco: Never  Vaping Use   Vaping status: Never Used  Substance and Sexual Activity   Alcohol use: Yes    Alcohol/week: 0.0 standard drinks of alcohol    Comment: 2/week   Drug use: No   Sexual activity: Not Currently    Partners: Male   Tobacco Counseling Counseling given: Not Answered  SDOH Screenings   Food Insecurity: No Food Insecurity (06/30/2023)  Housing: Low Risk  (06/30/2023)  Transportation Needs: No Transportation Needs (06/30/2023)  Utilities: Not At Risk (06/30/2023)  Depression (PHQ2-9): Low Risk  (06/28/2022)  Financial Resource Strain: Low Risk  (06/30/2023)  Physical Activity: Sufficiently Active (06/30/2023)  Social Connections: Moderately Integrated (06/30/2023)  Recent Concern: Social Connections - Moderately Isolated (05/02/2023)  Stress: Stress Concern Present (06/30/2023)  Tobacco Use: Low Risk  (12/19/2023)   See flowsheets for full screening details  Depression Screen     Goals Addressed   None    No data recorded      Objective:    There were no vitals filed  for this visit. There is no height or weight on file to calculate BMI.  Current Medications (verified) Outpatient Encounter Medications as of 07/09/2024  Medication Sig   ALPRAZolam  (XANAX ) 0.25 MG tablet Take 1 tablet (0.25 mg total) by mouth 3 (three) times daily as needed for anxiety. (Patient not taking: Reported on 12/19/2023)   amLODipine  (NORVASC ) 5 MG tablet TAKE 1 TABLET DAILY   atorvastatin  (LIPITOR) 20 MG tablet TAKE 1 TABLET DAILY   Calcium  Carb-Cholecalciferol (CALCIUM  PLUS VITAMIN D3 PO) Take 1 tablet by mouth daily.   colchicine  0.6 MG tablet Take 1 tablet (0.6 mg total) by mouth daily as needed (gout or psuedogout pain). (Patient not taking: Reported on 12/19/2023)   fexofenadine (ALLEGRA) 180 MG tablet Take 180 mg by mouth daily. (Patient not taking: Reported on 12/19/2023)   fluocinonide (LIDEX) 0.05 % gel Apply 1 application   topically 4 (four) times daily. Reported on 12/12/2015 (Patient not taking: Reported on 12/19/2023)   irbesartan  (AVAPRO ) 300 MG tablet Take 1 tablet (300 mg total) by mouth daily.   Multiple Vitamins-Minerals (CENTRUM SILVER PO) Take 1 tablet by mouth daily.   QUEtiapine  (SEROQUEL ) 25 MG tablet Take 1-2 tablets (25-50 mg total) by mouth at bedtime.   risedronate  (ACTONEL ) 150 MG tablet Take 1 tablet (150 mg total) by mouth every 30 (thirty) days. with water on empty stomach, nothing by mouth or lie down for next 30 minutes.   SYNTHROID  50 MCG tablet TAKE 1 TABLET DAILY BEFORE BREAKFAST FOR HYPOTHYROIDISM   TART CHERRY PO Take 1,000 mg by mouth daily.   No facility-administered encounter medications on file as of 07/09/2024.   Hearing/Vision screen No results found. Immunizations and Health Maintenance Health Maintenance  Topic Date Due   Medicare Annual Wellness (AWV)  06/29/2024   COVID-19 Vaccine (9 - 2025-26 season) 11/09/2024   DTaP/Tdap/Td (4 - Td or Tdap) 07/19/2032   Pneumococcal Vaccine: 50+ Years  Completed   Influenza Vaccine  Completed   Bone Density Scan  Completed   Zoster Vaccines- Shingrix  Completed   Meningococcal B Vaccine  Aged Out   Hepatitis C Screening  Discontinued        Assessment/Plan:  This is a routine wellness examination for Lisa [name].  Patient Care Team: Randol Dawes, MD as PCP - General (Family Medicine) Dr. Francis La as Consulting Physician (Dentistry) Charmayne Molly, MD as Consulting Physician (Ophthalmology) Burnard Debby LABOR, MD (Inactive) as Consulting Physician (Cardiology) Dietrich Alyce BROCKS, MD as Referring Physician (Dermatology) Joane Artist RAMAN, MD as Consulting Physician (Family Medicine) [facility] (Audiology)  I have personally reviewed and noted the following in the patient's chart:   Medical and social history Use of alcohol, tobacco or illicit drugs  Current medications and supplements including opioid  prescriptions. Functional ability and status Nutritional status Physical activity Advanced directives List of other physicians Hospitalizations, surgeries, and ER visits in previous 12 months Vitals Screenings to include cognitive, depression, and falls Referrals and appointments  No orders of the defined types were placed in this encounter.  In addition, I have reviewed and discussed with patient certain preventive protocols, quality metrics, and best practice recommendations. A written personalized care plan for preventive services as well as general preventive health recommendations were provided to patient.   Lisa [name], RMA   07/08/2024   No follow-ups on file.  After Visit Summary: (In Person-Printed) AVS printed and given to the patient  Nurse Notes: ***

## 2024-07-08 NOTE — Patient Instructions (Incomplete)
 HEALTH MAINTENANCE RECOMMENDATIONS:  It is recommended that you get at least 30 minutes of aerobic exercise at least 5 days/week (for weight loss, you may need as much as 60-90 minutes). This can be any activity that gets your heart rate up. This can be divided in 10-15 minute intervals if needed, but try and build up your endurance at least once a week.  Weight bearing exercise is also recommended twice weekly.  Eat a healthy diet with lots of vegetables, fruits and fiber.  Colorful foods have a lot of vitamins (ie green vegetables, tomatoes, red peppers, etc).  Limit sweet tea, regular sodas and alcoholic beverages, all of which has a lot of calories and sugar.  Up to 1 alcoholic drink daily may be beneficial for women (unless trying to lose weight, watch sugars).  Drink a lot of water.  Calcium  recommendations are 1200-1500 mg daily (1500 mg for postmenopausal women or women without ovaries), and vitamin D  1000 IU daily.  This should be obtained from diet and/or supplements (vitamins), and calcium  should not be taken all at once, but in divided doses.  Monthly self breast exams and yearly mammograms for women over the age of 62 is recommended.  Sunscreen of at least SPF 30 should be used on all sun-exposed parts of the skin when outside between the hours of 10 am and 4 pm (not just when at beach or pool, but even with exercise, golf, tennis, and yard work!)  Use a sunscreen that says broad spectrum so it covers both UVA and UVB rays, and make sure to reapply every 1-2 hours.  Remember to change the batteries in your smoke detectors when changing your clock times in the spring and fall. Carbon monoxide detectors are recommended for your home.  Use your seat belt every time you are in a car, and please drive safely and not be distracted with cell phones and texting while driving.   Ms. Byington , Thank you for taking time to come for your Medicare Wellness Visit. I appreciate your ongoing  commitment to your health goals. Please review the following plan we discussed and let me know if I can assist you in the future.   This is a list of the screening recommended for you and due dates:  Health Maintenance  Topic Date Due   Medicare Annual Wellness Visit  06/29/2024   COVID-19 Vaccine (9 - 2025-26 season) 11/09/2024   DTaP/Tdap/Td vaccine (4 - Td or Tdap) 07/19/2032   Pneumococcal Vaccine for age over 68  Completed   Flu Shot  Completed   Osteoporosis screening with Bone Density Scan  Completed   Zoster (Shingles) Vaccine  Completed   Meningitis B Vaccine  Aged Out   Hepatitis C Screening  Discontinued   Please work on increasing your aerobic exercise.  Just walking to/from the dining room probably isn't helping to keep your endurance in check.  Try and use the equipment at the gym (ie exercise bike or elliptical, or whatever you prefer) at least once a week, and it would be great if you could gradually work your way up to doing something for 30 minutes at a time. Working on increasing your endurance will allow you to walk longer distances without needing to stop and rest. I want you to do this so that you do NOT need a handicap placard.  [facility] should have a refill on the 25 mg tablets of the seroquel  (was written for 90 days in May, with a refill--but  for whatever reason they sent you the 50 mg refill, when I had specifically asked them to cancel that prescription). Let us  know if you need us  to send a new one.

## 2024-07-08 NOTE — Progress Notes (Deleted)
 No chief complaint on file.    Subjective:   Lisa [name] is a 83 y.o. female who presents for a Medicare Annual Wellness Visit.  Allergies (verified) Amoxicillin, Erythromycin, and Sulfa antibiotics   History: Past Medical History:  Diagnosis Date   Allergy    Colon polyps    hyperplastic   Hypercholesteremia    Hypertension    Insomnia    Leukopenia    Multinodular thyroid     OA (osteoarthritis)    hips, knees and LS   Osteopenia    Psoriasis    Shingles 08/2016   left forehead   Vitamin D  deficiency    Past Surgical History:  Procedure Laterality Date   cardiolyte  02-2002   CATARACT EXTRACTION, BILATERAL Bilateral 04/05/18 and 05/09/18   Dr. Ruth ([place] ophtho)   COLONOSCOPY  12-2006   FLEXIBLE SIGMOIDOSCOPY  05-2000   HAND TENDON SURGERY  1979   L 4th finger   KNEE SURGERY  1969   Left, cartilage   TONSILLECTOMY AND ADENOIDECTOMY     Family History  Problem Relation Age of Onset   Allergies Mother    Hypertension Mother    Vision loss Mother    Heart disease Mother    Arthritis Mother    Arthritis Father    Heart disease Father 35   Hearing loss Sister    Hypertension Sister    Hyperlipidemia Sister    Arthritis Sister    Heart disease Sister 67       massive MI   Arthritis Sister    Heart disease Sister    Hyperlipidemia Sister    Hypertension Sister    Breast cancer Sister        had lumpectomy earlier; at 21 had mastectomy   Arthritis Sister    Hypertension Sister    Hyperlipidemia Sister    Thyroid  disease Sister    Diabetes Sister 76       diet controlled   Vision loss Sister        macular degeneration   Stroke Maternal Grandmother    Heart disease Maternal Grandfather    Stroke Maternal Grandfather    Heart disease Paternal Grandmother    Stroke Paternal Grandmother    Cancer Neg Hx    Social History   Occupational History   Occupation: Admin at [employer]    Employer: [employer]  Tobacco Use    Smoking status: Never   Smokeless tobacco: Never  Vaping Use   Vaping status: Never Used  Substance and Sexual Activity   Alcohol use: Yes    Alcohol/week: 0.0 standard drinks of alcohol    Comment: 2/week   Drug use: No   Sexual activity: Not Currently    Partners: Male   Tobacco Counseling Counseling given: Not Answered  SDOH Screenings   Food Insecurity: No Food Insecurity (06/30/2023)  Housing: Low Risk  (06/30/2023)  Transportation Needs: No Transportation Needs (06/30/2023)  Utilities: Not At Risk (06/30/2023)  Depression (PHQ2-9): Low Risk  (06/28/2022)  Financial Resource Strain: Low Risk  (06/30/2023)  Physical Activity: Sufficiently Active (06/30/2023)  Social Connections: Moderately Integrated (06/30/2023)  Recent Concern: Social Connections - Moderately Isolated (05/02/2023)  Stress: Stress Concern Present (06/30/2023)  Tobacco Use: Low Risk  (12/19/2023)   See flowsheets for full screening details  Depression Screen     Goals Addressed   None    No data recorded      Objective:    There were no vitals filed  for this visit. There is no height or weight on file to calculate BMI.  Current Medications (verified) Outpatient Encounter Medications as of 07/09/2024  Medication Sig   ALPRAZolam  (XANAX ) 0.25 MG tablet Take 1 tablet (0.25 mg total) by mouth 3 (three) times daily as needed for anxiety. (Patient not taking: Reported on 12/19/2023)   amLODipine  (NORVASC ) 5 MG tablet TAKE 1 TABLET DAILY   atorvastatin  (LIPITOR) 20 MG tablet TAKE 1 TABLET DAILY   Calcium  Carb-Cholecalciferol (CALCIUM  PLUS VITAMIN D3 PO) Take 1 tablet by mouth daily.   colchicine  0.6 MG tablet Take 1 tablet (0.6 mg total) by mouth daily as needed (gout or psuedogout pain). (Patient not taking: Reported on 12/19/2023)   fexofenadine (ALLEGRA) 180 MG tablet Take 180 mg by mouth daily. (Patient not taking: Reported on 12/19/2023)   fluocinonide (LIDEX) 0.05 % gel Apply 1 application   topically 4 (four) times daily. Reported on 12/12/2015 (Patient not taking: Reported on 12/19/2023)   irbesartan  (AVAPRO ) 300 MG tablet Take 1 tablet (300 mg total) by mouth daily.   Multiple Vitamins-Minerals (CENTRUM SILVER PO) Take 1 tablet by mouth daily.   QUEtiapine  (SEROQUEL ) 25 MG tablet Take 1-2 tablets (25-50 mg total) by mouth at bedtime.   risedronate  (ACTONEL ) 150 MG tablet Take 1 tablet (150 mg total) by mouth every 30 (thirty) days. with water on empty stomach, nothing by mouth or lie down for next 30 minutes.   SYNTHROID  50 MCG tablet TAKE 1 TABLET DAILY BEFORE BREAKFAST FOR HYPOTHYROIDISM   TART CHERRY PO Take 1,000 mg by mouth daily.   No facility-administered encounter medications on file as of 07/09/2024.   Hearing/Vision screen No results found. Immunizations and Health Maintenance Health Maintenance  Topic Date Due   Medicare Annual Wellness (AWV)  06/29/2024   COVID-19 Vaccine (9 - 2025-26 season) 11/09/2024   DTaP/Tdap/Td (4 - Td or Tdap) 07/19/2032   Pneumococcal Vaccine: 50+ Years  Completed   Influenza Vaccine  Completed   Bone Density Scan  Completed   Zoster Vaccines- Shingrix  Completed   Meningococcal B Vaccine  Aged Out   Hepatitis C Screening  Discontinued        Assessment/Plan:  This is a routine wellness examination for Lisa [name].  Patient Care Team: Randol Dawes, MD as PCP - General (Family Medicine) Dr. Francis La as Consulting Physician (Dentistry) Charmayne Molly, MD as Consulting Physician (Ophthalmology) Burnard Debby LABOR, MD (Inactive) as Consulting Physician (Cardiology) Tommas Pears, MD as Referring Physician (Endocrinology) Claudene Arthea HERO, DO as Consulting Physician (Family Medicine)  I have personally reviewed and noted the following in the patient's chart:   Medical and social history Use of alcohol, tobacco or illicit drugs  Current medications and supplements including opioid prescriptions. Functional ability and  status Nutritional status Physical activity Advanced directives List of other physicians Hospitalizations, surgeries, and ER visits in previous 12 months Vitals Screenings to include cognitive, depression, and falls Referrals and appointments  No orders of the defined types were placed in this encounter.  In addition, I have reviewed and discussed with patient certain preventive protocols, quality metrics, and best practice recommendations. A written personalized care plan for preventive services as well as general preventive health recommendations were provided to patient.   Lucienne JULIANNA Fuse, RMA   07/08/2024   No follow-ups on file.  After Visit Summary: (In Person-Printed) AVS printed and given to the patient  Nurse Notes: ***

## 2024-07-08 NOTE — Progress Notes (Unsigned)
 No chief complaint on file.   Lisa [name] is a 83 y.o. female who presents for annual physical, Medicare wellness visit (see separate note) and follow-up on chronic medical conditions.  She had labs done prior to her visit, see below.  Hypertension follow-up:  Patient reports compliance in taking Avapro  300mg . And amlodipine  5 mg daily. She denies medication side effects. She denies headaches, dizziness, edema, chest pain, palpitations. BP's are running ***  BP Readings from Last 3 Encounters:  12/19/23 128/70  10/10/23 (!) 162/78  10/05/23 128/78      Hyperlipidemia follow-up: Patient is reportedly following a low-fat, low cholesterol diet. Compliant with medications (atorvastatin  20mg ) and denies medication side effects.   Lipids were at goal on recent labs, see below.    Hypothyroidism: She denies fatigue, hair, skin, bowel changes. Denies any mood changes. Denies dysphagia or any thyroid  concerns. Takes Synthroid  50 mcg on empty stomach, separate from food and other medications. She takes MVI and calcium  at lunch.  TSH was at goal on recent labs, see below.  Insomnia and anxiety:  She has been sleeping well with seroquel .  She takes the seroquel  30 minutes before going to bed, and this works well. She found that taking 2 of the 25 mg tablets was more effective than taking one 50 mg tablet, so this was switched at her last visit in May.  She likes to read her book at night, which helps relax her.  She states her moods are good, no longer has anxiety (was bad related to the move only). She previously took lexapro , tapered off after her husband passed away. Re-tried this for insomnia, but didn't help.   Urinary frequency had contributed to some sleep issues in the past.  She tries to limit her evening fluid intake and watches her caffeine intake.  In May she had noted some improvement, wasn't interested in medications for OAB.   She gets up 3-4 times/night, and most of the time  can get back to sleep. She feels like this is tolerable, and prefers  not to take a medication to improve it further.   Osteoporosis: She restarted Actonel  monthly in 09/2023. She is tolerating this without side effects. Most recent DEXA (08/2023) showed osteoporosis, with T-2.6 at R fem neck (left not assessed/mentioned). She continues to take calcium  and vitamin D  daily, and weight-bearing exercise.  Vitamin D  level was normal (lower than previously) on last check in 09/2023.  Component Ref Range & Units (hover) 10 mo ago 9 yr ago 12 yr ago  Vit D, 25-Hydroxy 33.5 40 R, CM 54 R, CM    Seasonal allergies: she takes OTC Allegra as needed, seasonally.  Allergies tend to be worse in the Spring.  Hasn't need to use any Allegra in a while.  Hasn't had any recurrence of vertigo in quite some time.    Immunization History  Administered Date(s) Administered   Fluad Quad(high Dose 65+) 05/22/2019, 06/12/2020, 06/18/2021, 06/01/2022   Fluad Trivalent(High Dose 65+) 04/18/2023   INFLUENZA, HIGH DOSE SEASONAL PF 05/16/2014, 04/23/2015, 05/05/2016, 05/12/2017, 05/24/2018, 04/20/2024   Influenza Split 06/01/2022   Influenza Whole 05/02/2010   Moderna Sars-Covid-2 Vaccination 05/06/2022   PFIZER(Purple Top)SARS-COV-2 Vaccination 08/20/2019, 09/10/2019, 05/15/2020   PNEUMOCOCCAL CONJUGATE-20 10/05/2023   Pfizer Covid-19 Vaccine Bivalent Booster 44yrs & up 04/29/2021   Pfizer(Comirnaty)Fall Seasonal Vaccine 12 years and older 10/25/2022, 05/02/2023, 05/11/2024   Pneumococcal Conjugate-13 04/17/2014   Pneumococcal Polysaccharide-23 09/21/2005, 08/19/2020   Respiratory Syncytial Virus Vaccine,Recomb Aduvanted(Arexvy) 07/07/2022  Td 01/02/2002   Tdap 01/13/2012, 07/19/2022   Zoster Recombinant(Shingrix) 05/20/2018, 08/21/2018   Zoster, Live 02/26/2011   Last Pap smear: 04/2015, normal no high risk HPV Last mammo: 08/2023 Colonoscopy 5/08  (refuses to do again); negative Cologuard 05/2017 Dexa  08/2023 T-2.6 at R fem neck Dentist and ophtho regularly   Exercise:   ***UPDATE [employer] Sneakers 3x/week (50 minutes, mixture of cardio and weights). She stopped going to the dance classes--were too early.  Vitamin D  last checked 04/2015 normal at 40    End of Life Discussion:  Patient has a living will and medical power of attorney.      PMH, PSH, SH and FH were reviewed and updated   ROS: The patient denies anorexia, fever, weight changes, headaches, vision changes, ear pain, sore throat, breast concerns, chest pain, palpitations, dizziness, syncope, dyspnea on exertion, cough, swelling, nausea, vomiting, diarrhea, constipation, abdominal pain, melena, hematochezia, indigestion/heartburn, hematuria, dysuria, vaginal bleeding, discharge, odor or itch, genital lesions, numbness, tingling, weakness, tremor, suspicious skin lesions, depression, anxiety, abnormal bleeding/bruising, or enlarged lymph nodes.   Uses hearing aids.  Denies significant tinnitus or vertigo. Slight leakage of urine, urinary frequency, unchanged. Up 3x/night.  Denies dysuria, hematuria. Moods are good. Denies any change to fatty growth on the back of her neck. Insomnia per HPI, well controlled.  L hip R knee  PHYSICAL EXAM:   There were no vitals taken for this visit.  Wt Readings from Last 3 Encounters:  12/19/23 141 lb 12.8 oz (64.3 kg)  10/10/23 138 lb (62.6 kg)  10/05/23 139 lb 9.6 oz (63.3 kg)    General Appearance:   Alert, cooperative, no distress, appears stated age. Somewhat excitable.  Head:   Normocephalic, without obvious abnormality, atraumatic    Eyes:   PERRL, conjunctiva/corneas clear, EOM's intact, fundi benign    Ears:   Normal TM's and external ear canals    Nose:   No drainage or sinus tenderness  Throat:   Normal mucosa  Neck:   Supple, no lymphadenopathy; thyroid : no enlargement/tenderness/nodules; no carotid bruit or JVD. c-spine nontender. There is a 3.5 x 4 cm  soft tissue mass at posterior neck.  Appears fairly central, slightly more to the right.. Nontender.    Back:   Spine nontender, no curvature, ROM normal, no CVA tenderness    Lungs:   Clear to auscultation bilaterally without wheezes, rales or ronchi; respirations unlabored    Chest Wall:   No tenderness or deformity    Heart:   Regular rate and rhythm, S1 and S2 normal, no murmur, rub or gallop    Breast Exam:   No tenderness, masses, or nipple discharge or inversion. No axillary lymphadenopathy    Abdomen:   Soft, non-tender, nondistended, normoactive bowel sounds, no masses, no hepatosplenomegaly    Genitalia:   Exam declined by patient  Rectal:   Exam declined by patient  Extremities:   No clubbing, cyanosis or edema.  Arthritic changes to fingers, especially on the right (2nd 3rd and 4th fingers). Significant swelling and deformity of the R 3rd PIP joint. No overlying warmth.  Not painful  Pulses:   2+ and symmetric all extremities    Skin:   Skin color, texture, turgor normal, no rashes or lesions. Many scattered SK's and angiomas. Discoloration of distal great toenails (from nail polish), rest are clear ***  Lymph nodes:  Cervical, supraclavicular, and axillary nodes normal   Neurologic:   Normal strength, sensation and gait; reflexes 2-3+  and symmetric throughout                 Psych:  Normal mood, affect, hygiene and grooming  ***UPDATE SIZE OF NECK LIPOMA UPDATE fingers/hands UPDATE if discoloration of toenails    Chemistry      Component Value Date/Time   NA 140 07/04/2024 0942   K 4.2 07/04/2024 0942   CL 101 07/04/2024 0942   CO2 23 07/04/2024 0942   BUN 14 07/04/2024 0942   CREATININE 0.77 07/04/2024 0942   CREATININE 0.66 05/05/2017 0745      Component Value Date/Time   CALCIUM  10.1 07/04/2024 0942   ALKPHOS 61 07/04/2024 0942   AST 22 07/04/2024 0942   ALT 24 07/04/2024 0942   BILITOT 0.5 07/04/2024 0942     Fasting glucose 101  Lab Results  Component Value  Date   WBC 5.7 07/04/2024   HGB 13.2 07/04/2024   HCT 40.6 07/04/2024   MCV 95 07/04/2024   PLT 376 07/04/2024   Lab Results  Component Value Date   CHOL 154 07/04/2024   HDL 66 07/04/2024   LDLCALC 73 07/04/2024   TRIG 76 07/04/2024   CHOLHDL 2.3 07/04/2024   Lab Results  Component Value Date   TSH 1.560 07/04/2024      ASSESSMENT/PLAN:   Does she always need/take 2 seroquel , or does she sometimes do well with just 25mg ?  If not always taking 50mg , may not need refill yet.   Discussed monthly self breast exams and yearly mammograms (past due); at least 30 minutes of aerobic activity at least 5 days/week and weight-bearing exercise 2x/week; proper sunscreen use reviewed; healthy diet, including goals of calcium  and vitamin D  intake and alcohol recommendations (less than or equal to 1 drink/day) reviewed; regular seatbelt use; changing batteries in smoke detectors.  Immunization recommendations discussed--continue yearly high dose flu shots.  Prevnar-20--declines today. Advised to schedule NV or get from the pharmacy. Colon cancer screening recommendations--she declines any further colon cancer screening. Last was a negative Cologuard in 05/2017. F/u DEXA was due 07/2021. Scheduled for DEXA and mammo in 08/2023   Pt is full code, full care. MOST form reviewed and updated  F/u in 6 months for med check, CPE/AWV in 1 year.

## 2024-07-09 ENCOUNTER — Encounter: Payer: Self-pay | Admitting: Family Medicine

## 2024-07-09 ENCOUNTER — Ambulatory Visit: Payer: Medicare HMO | Admitting: Family Medicine

## 2024-07-09 VITALS — BP 130/70 | HR 76 | Ht 61.0 in | Wt 145.6 lb

## 2024-07-09 DIAGNOSIS — M810 Age-related osteoporosis without current pathological fracture: Secondary | ICD-10-CM

## 2024-07-09 DIAGNOSIS — E7800 Pure hypercholesterolemia, unspecified: Secondary | ICD-10-CM

## 2024-07-09 DIAGNOSIS — E039 Hypothyroidism, unspecified: Secondary | ICD-10-CM

## 2024-07-09 DIAGNOSIS — G4700 Insomnia, unspecified: Secondary | ICD-10-CM | POA: Diagnosis not present

## 2024-07-09 DIAGNOSIS — N3281 Overactive bladder: Secondary | ICD-10-CM | POA: Diagnosis not present

## 2024-07-09 DIAGNOSIS — R7301 Impaired fasting glucose: Secondary | ICD-10-CM

## 2024-07-09 DIAGNOSIS — Z0000 Encounter for general adult medical examination without abnormal findings: Secondary | ICD-10-CM | POA: Diagnosis not present

## 2024-07-09 DIAGNOSIS — I10 Essential (primary) hypertension: Secondary | ICD-10-CM

## 2024-07-25 ENCOUNTER — Other Ambulatory Visit: Payer: Self-pay | Admitting: Family Medicine

## 2024-07-25 DIAGNOSIS — M810 Age-related osteoporosis without current pathological fracture: Secondary | ICD-10-CM

## 2024-08-03 ENCOUNTER — Encounter: Payer: Self-pay | Admitting: Family Medicine

## 2024-08-03 ENCOUNTER — Other Ambulatory Visit: Payer: Self-pay | Admitting: Family Medicine

## 2024-08-03 DIAGNOSIS — E039 Hypothyroidism, unspecified: Secondary | ICD-10-CM

## 2024-09-12 ENCOUNTER — Other Ambulatory Visit: Payer: Self-pay | Admitting: Family Medicine

## 2024-09-12 DIAGNOSIS — M810 Age-related osteoporosis without current pathological fracture: Secondary | ICD-10-CM

## 2025-01-07 ENCOUNTER — Ambulatory Visit: Admitting: Family Medicine

## 2025-08-05 ENCOUNTER — Encounter: Admitting: Family Medicine
